# Patient Record
Sex: Male | Born: 1945 | Race: White | Hispanic: No | Marital: Married | Smoking: Former smoker
Health system: Southern US, Community
[De-identification: ages and names within clinical notes are randomized; demographics above are authoritative.]

## PROBLEM LIST (undated history)

## (undated) DIAGNOSIS — N183 Chronic kidney disease, stage 3 (moderate): Secondary | ICD-10-CM

## (undated) DIAGNOSIS — I2510 Atherosclerotic heart disease of native coronary artery without angina pectoris: Secondary | ICD-10-CM

## (undated) DIAGNOSIS — E039 Hypothyroidism, unspecified: Secondary | ICD-10-CM

## (undated) DIAGNOSIS — N368 Other specified disorders of urethra: Secondary | ICD-10-CM

## (undated) DIAGNOSIS — I10 Essential (primary) hypertension: Secondary | ICD-10-CM

## (undated) DIAGNOSIS — I119 Hypertensive heart disease without heart failure: Secondary | ICD-10-CM

## (undated) DIAGNOSIS — C61 Malignant neoplasm of prostate: Secondary | ICD-10-CM

## (undated) DIAGNOSIS — E785 Hyperlipidemia, unspecified: Secondary | ICD-10-CM

## (undated) DIAGNOSIS — K219 Gastro-esophageal reflux disease without esophagitis: Secondary | ICD-10-CM

## (undated) DIAGNOSIS — I480 Paroxysmal atrial fibrillation: Secondary | ICD-10-CM

## (undated) DIAGNOSIS — H532 Diplopia: Secondary | ICD-10-CM

## (undated) DIAGNOSIS — Z978 Presence of other specified devices: Secondary | ICD-10-CM

## (undated) DIAGNOSIS — R7301 Impaired fasting glucose: Secondary | ICD-10-CM

## (undated) DIAGNOSIS — G7000 Myasthenia gravis without (acute) exacerbation: Secondary | ICD-10-CM

## (undated) DIAGNOSIS — R339 Retention of urine, unspecified: Secondary | ICD-10-CM

## (undated) DIAGNOSIS — D649 Anemia, unspecified: Secondary | ICD-10-CM

## (undated) HISTORY — DX: Paroxysmal atrial fibrillation: I48.0

## (undated) HISTORY — PX: OTHER SURGICAL HISTORY: SHX169

## (undated) HISTORY — DX: Retention of urine, unspecified: R33.9

## (undated) HISTORY — DX: Chronic kidney disease, stage 3 (moderate): N18.3

## (undated) HISTORY — DX: Hypothyroidism, unspecified: E03.9

## (undated) HISTORY — PX: COLONOSCOPY: SHX174

## (undated) HISTORY — DX: Diplopia: H53.2

## (undated) HISTORY — DX: Hyperlipidemia, unspecified: E78.5

## (undated) HISTORY — DX: Impaired fasting glucose: R73.01

## (undated) HISTORY — DX: Atherosclerotic heart disease of native coronary artery without angina pectoris: I25.10

## (undated) HISTORY — DX: Presence of other specified devices: Z97.8

## (undated) HISTORY — DX: Myasthenia gravis without (acute) exacerbation: G70.00

## (undated) HISTORY — DX: Hypertensive heart disease without heart failure: I11.9

## (undated) HISTORY — DX: Gastro-esophageal reflux disease without esophagitis: K21.9

## (undated) HISTORY — DX: Essential (primary) hypertension: I10

## (undated) HISTORY — DX: Other specified disorders of urethra: N36.8

## (undated) HISTORY — DX: Malignant neoplasm of prostate: C61

## (undated) HISTORY — PX: TONSILLECTOMY: SUR1361

## (undated) HISTORY — DX: Anemia, unspecified: D64.9

---

## 2003-03-04 ENCOUNTER — Encounter (INDEPENDENT_AMBULATORY_CARE_PROVIDER_SITE_OTHER): Payer: Self-pay | Admitting: Cardiology

## 2003-03-04 ENCOUNTER — Ambulatory Visit (HOSPITAL_COMMUNITY): Admission: RE | Admit: 2003-03-04 | Discharge: 2003-03-04 | Payer: Self-pay | Admitting: Cardiology

## 2007-02-01 DIAGNOSIS — C61 Malignant neoplasm of prostate: Secondary | ICD-10-CM

## 2007-02-01 HISTORY — PX: PROSTATECTOMY: SHX69

## 2007-02-01 HISTORY — DX: Malignant neoplasm of prostate: C61

## 2007-03-08 ENCOUNTER — Ambulatory Visit (HOSPITAL_COMMUNITY): Admission: RE | Admit: 2007-03-08 | Discharge: 2007-03-08 | Payer: Self-pay | Admitting: Urology

## 2007-03-28 ENCOUNTER — Ambulatory Visit: Admission: RE | Admit: 2007-03-28 | Discharge: 2007-04-17 | Payer: Self-pay | Admitting: Radiation Oncology

## 2007-06-06 ENCOUNTER — Encounter (INDEPENDENT_AMBULATORY_CARE_PROVIDER_SITE_OTHER): Payer: Self-pay | Admitting: Urology

## 2007-06-06 ENCOUNTER — Inpatient Hospital Stay (HOSPITAL_COMMUNITY): Admission: RE | Admit: 2007-06-06 | Discharge: 2007-06-07 | Payer: Self-pay | Admitting: Urology

## 2008-02-01 HISTORY — PX: OTHER SURGICAL HISTORY: SHX169

## 2008-04-02 ENCOUNTER — Observation Stay (HOSPITAL_COMMUNITY): Admission: EM | Admit: 2008-04-02 | Discharge: 2008-04-03 | Payer: Self-pay | Admitting: Emergency Medicine

## 2008-04-02 ENCOUNTER — Ambulatory Visit: Admission: RE | Admit: 2008-04-02 | Discharge: 2008-05-15 | Payer: Self-pay | Admitting: Radiation Oncology

## 2008-07-23 ENCOUNTER — Ambulatory Visit: Admission: RE | Admit: 2008-07-23 | Discharge: 2008-08-07 | Payer: Self-pay | Admitting: Radiation Oncology

## 2008-08-06 ENCOUNTER — Encounter (HOSPITAL_COMMUNITY): Admission: RE | Admit: 2008-08-06 | Discharge: 2008-10-30 | Payer: Self-pay | Admitting: Urology

## 2008-10-08 ENCOUNTER — Ambulatory Visit: Admission: RE | Admit: 2008-10-08 | Discharge: 2009-01-06 | Payer: Self-pay | Admitting: Radiation Oncology

## 2010-05-13 LAB — CARDIAC PANEL(CRET KIN+CKTOT+MB+TROPI)
Relative Index: INVALID (ref 0.0–2.5)
Relative Index: INVALID (ref 0.0–2.5)
Troponin I: 0.01 ng/mL (ref 0.00–0.06)

## 2010-05-13 LAB — DIFFERENTIAL
Eosinophils Absolute: 0.1 10*3/uL (ref 0.0–0.7)
Lymphocytes Relative: 20 % (ref 12–46)
Lymphs Abs: 1.2 10*3/uL (ref 0.7–4.0)
Neutro Abs: 4.4 10*3/uL (ref 1.7–7.7)
Neutrophils Relative %: 71 % (ref 43–77)

## 2010-05-13 LAB — PROTIME-INR
INR: 2.3 — ABNORMAL HIGH (ref 0.00–1.49)
INR: 2.5 — ABNORMAL HIGH (ref 0.00–1.49)
Prothrombin Time: 27.1 seconds — ABNORMAL HIGH (ref 11.6–15.2)
Prothrombin Time: 28.8 seconds — ABNORMAL HIGH (ref 11.6–15.2)

## 2010-05-13 LAB — COMPREHENSIVE METABOLIC PANEL
BUN: 15 mg/dL (ref 6–23)
CO2: 25 mEq/L (ref 19–32)
Calcium: 9.2 mg/dL (ref 8.4–10.5)
Creatinine, Ser: 1.34 mg/dL (ref 0.4–1.5)
GFR calc non Af Amer: 54 mL/min — ABNORMAL LOW (ref >60)
Glucose, Bld: 89 mg/dL (ref 70–99)

## 2010-05-13 LAB — CBC
MCHC: 35 g/dL (ref 30.0–36.0)
MCV: 93.7 fL (ref 78.0–100.0)
Platelets: 317 10*3/uL (ref 150–400)

## 2010-05-13 LAB — CK TOTAL AND CKMB (NOT AT ARMC)
Relative Index: INVALID (ref 0.0–2.5)
Total CK: 99 U/L (ref 7–232)

## 2010-05-13 LAB — TROPONIN I: Troponin I: 0.03 ng/mL (ref 0.00–0.06)

## 2010-06-15 NOTE — Op Note (Signed)
Eugene Castaneda, Eugene Castaneda             ACCOUNT NO.:  [id number]   MEDICAL RECORD NO.:  [id number]          PATIENT TYPE:  INP   LOCATION:  0005                         FACILITY:  [facility]   PHYSICIAN:  Ronald L. Earlene Plater, M.D.  DATE OF BIRTH:  01-30-1946   DATE OF PROCEDURE:  06/06/2007  DATE OF DISCHARGE:                               OPERATIVE REPORT   DIAGNOSIS:  Adenocarcinoma of the prostate.   OPERATIVE PROCEDURE:  Robotic assisted laparoscopic radical  prostatectomy with bilateral pelvic lymphadenectomy.   SURGEON:  Gaynelle Arabian, M.D.   ASSISTANT:  Heloise Purpura, MD   ANESTHESIA:  General endotracheal.   ESTIMATED BLOOD LOSS:  100 mL.   TUBES:  20-French coude Foley catheter and a large round Blake drain.   COMPLICATIONS:  None.   INDICATIONS FOR PROCEDURE:  Eugene Castaneda is a very nice 65 year old white  male who presented with an elevated PSA.  He subsequently underwent  ultrasound and biopsy of the prostate which revealed a Gleason score 6  which was 3+ 3 adenocarcinoma in 10% of biopsy specimen at the right  apex of prostate.  However it was 2 of 2 cores at 40% Gleason score 8  4+4 carcinoma of the left apex of the prostate.  No other cancer was  noted.  Metastatic workup was essentially negative.  He has been cleared  for surgery by both Dr. Abigail Miyamoto and by Dr. Viann Fish and after  understanding risks, benefits and alternatives he has elected to undergo  the above procedure.   PROCEDURE IN DETAIL:  The patient is placed in supine position.  After  proper general tracheal anesthesia, he was placed in exaggerated  lithotomy position, prepped and draped with Betadine in sterile fashion.  A 24 French 30 mL balloon Foley was inserted in the bladder and the  bladder was drained.  Periumbilical incision was made vertically, and  the anterior and posterior rectus sheaths and peritoneum were taken  down.  A 12-mm port was placed under direct vision and the abdomen was  insufflated with carbon dioxide.  A stitch was placed at the skin level  with Vicryl to prevent leakage of gas.  Under direct vision, right and  left robotic arms were placed in appropriate position as was the fourth  arm.  A 5 mm and 12 mm working ports were placed in the right side of  the abdomen in the usual position.  The dissection was begun with the  hot shears.  The median and medial umbilical ligaments were taken down.  The space of Retzius was entered and dissected clear.  The endopelvic  fascia was incised bilaterally.  The lateral portion of the prostate was  taken down.  The puboprostatic ligaments were incised and the  superficial dorsal vein was cauterized with ERBE bipolar and incised.  The deep dorsal vein complex was then clamped with an endovascular  stapler and clamped and cut.  The bladder neck was then approached after  an amp of indigo carmine had been given IV.  It was sharply taken down  to the Foley which was then used as traction  device and the posterior  bladder neck was taken from the prostate.  The seminal vesicles and  ampulla of the vas deferens were dissected free.  The ampulla of the vas  deferens were incised and seminal vesicles were dissected to the tips.  The right tip was clipped, the left tip was taken down both sharply and  bluntly and vessels were cauterized with Bovie coagulation cautery,  avoiding the neurovascular bundle.  Denonvilliers fascia was then  approached posteriorly and taken down to the apex of the prostate and  laterally exposing the pedicles of the prostate.  The right and left  lymphadenectomy were then performed under direct vision.  The obturator  lymph nodes in external iliac lymph nodes were taken down. All  lymphatics apically and proximally were clipped with Hem-o-lok clips.  Specimens were removed and submitted as right and left pelvic lymph  nodes.  The dissection was carried down to the circumflex iliac veins  and  obturator vessels and nerves were visualized and protected.  The  nerve spare was then performed bilaterally.  Although there was more  tumor and higher grade tumor the left apex, it appeared to be free, so  this was taken down.  A veil type procedure was performed and the  posterior lateral tissue was maintained in the proper plane.  The planes  were joined and Denonvilliers fascia that had been created previously  and the pedicle was taken in packets on both right and left side,  clipped with Hem-o-lok clips and taken down to the apex of the prostate.  The apex of the prostate was then sharply dissected to the Foley and the  posterior urethra was incised.  The specimen was placed in the right  lower quadrant.  Good hemostasis noted to be present.  The pelvis was  irrigated with fluid.  The rectum was then insufflated with a previously  placed red rubber catheter and there was no leakage noted.  Good  hemostasis was noted be present.  Then another amp of indigo carmine was  given IV and the bladder neck appeared to be appropriate size.  The  bladder neck was then approximated to the urethra in the 6 o'clock  position with a 2-0 Vicryl suture and the anastomosis was completed with  a 3-0 Monocryl suture with the dyed and undyed tied together in a knot  posteriorly and a running fashion and tied anteriorly.  A 20-French  coude catheter was passed in the bladder.  The bladder was noted to  irrigate well with blue dye and no leakage was noted.  The Foley was  left in place.  A large Blake drain was placed through the fourth arm  port and the cannula was removed and it was sutured in place with nylon  suture.  The 12 mm working portal on the right side was removed and  closed under direct vision with suture passers with a 2-0 Vicryl suture.  The ports were visually removed and there was no bleeding noted.  The  drain was in place and the specimen was grasped with an EndoCatch bag  and placed  in the position actually before the 12 mm port was removed.  All ports were removed and under direct vision.  The specimen was then  removed through the periumbilical incision.  The periumbilical incision  was irrigated.  The fascia was closed with a running 2-0 Vicryl suture.  All wounds were injected with 0.25% Marcaine solution and closed with  skin  staples and dressed sterilely.  Specimens were all submitted to  pathology.      Ronald L. Earlene Plater, M.D.  Electronically Signed     RLD/MEDQ  D:  06/06/2007  T:  06/06/2007  Job:  [id number]

## 2010-06-15 NOTE — Discharge Summary (Signed)
[name], Eugene Castaneda             ACCOUNT NO.:  [id number]   MEDICAL RECORD NO.:  [id number]          PATIENT TYPE:  INP   LOCATION:  3734                         FACILITY:  [facility]   PHYSICIAN:  Georga Hacking, M.D.DATE OF BIRTH:  [birth year]   DATE OF ADMISSION:  04/02/2008  DATE OF DISCHARGE:  04/03/2008                               DISCHARGE SUMMARY   FINAL DIAGNOSES:  1. Substernal chest pressure and tightness, resolved.      a.     Myocardial function ruled out.  2. Atrial fibrillation and atrial flutter with recurrence.      a.     Recent ablation for atrial fibrillation and flutter at [facility] on February 18.  3. Hypertension.  4. Prostate cancer.  5. Hypothyroidism.   HISTORY:  A 65 year old male who had a recent atrial fibrillation  ablation for recurrent paroxysmal atrial fibrillation.  He has been  doing well and had just been seen in the office last week.  This morning  he developed substernal pressure and tightness and went to see the nurse  at work and then came to the office.  An EKG was unremarkable but he was  in atrial flutter at the time.  The chest pressure was something that  was new for him and he was admitted to rule out a myocardial infarction.  Please see the previously dictated history and physical for the  remainder of the details.   HOSPITAL COURSE:  INR was 2.3, CBC was normal, chemistry panel was  normal.  Serial cardiac enzymes and troponin were all normal.  An  echocardiogram done in the office showed no evidence of pericardial  effusion, he had some evidence of left ventricular hypertrophy.  He was  given some Tylenol as pain went away, subsequently he had his flecainide  dose increased and his heart went back into rhythm.  His symptoms  resolved when he went back into sinus rhythm.  He was discharged home  the next day in improved condition.   DISCHARGE MEDICATIONS:  1. Flecainide 50 mg b.i.d., he may take an extra  flecainide 150 mg if      he has recurrent atrial fibrillation or flutter.  2. Carafate 1 gram four times a day.  3. Multivitamins daily.  4. Protonix 40 mg daily.  5. Lisinopril 30 mg daily.  6. Trilipix 135 mg daily.  7. Aspirin 81 mg daily.  8. Levothyroxine 0.15 mg daily.  9. Fish oil 1200 mg twice daily.  10.Coumadin 1 mg daily except 2 mg on Sunday.   He is to follow up with me in 1 week and have a Cardiolite stress test.  He is not to exercise until he has this done.  He is to use extra  flecainide if has recurrent atrial fibrillation.  Is to call if there  are problems.      Georga Hacking, M.D.  Electronically Signed     WST/MEDQ  D:  04/03/2008  T:  04/03/2008  Job:  [id number]   cc:   Chales Salmon. Abigail Miyamoto, M.D.  Mick Sell, MD  Lucrezia Starch. Earlene Plater, M.D.

## 2010-10-26 LAB — BASIC METABOLIC PANEL
BUN: 16
Creatinine, Ser: 1.41
GFR calc non Af Amer: 51 — ABNORMAL LOW
Potassium: 4.1

## 2010-10-26 LAB — CBC
HCT: 38 — ABNORMAL LOW
Platelets: 232
WBC: 4.8

## 2011-02-10 ENCOUNTER — Other Ambulatory Visit: Payer: Self-pay | Admitting: Cardiology

## 2011-02-14 DIAGNOSIS — N476 Balanoposthitis: Secondary | ICD-10-CM | POA: Diagnosis not present

## 2011-02-14 DIAGNOSIS — N393 Stress incontinence (female) (male): Secondary | ICD-10-CM | POA: Diagnosis not present

## 2011-02-14 DIAGNOSIS — C61 Malignant neoplasm of prostate: Secondary | ICD-10-CM | POA: Diagnosis not present

## 2011-02-14 DIAGNOSIS — N529 Male erectile dysfunction, unspecified: Secondary | ICD-10-CM | POA: Diagnosis not present

## 2011-03-11 DIAGNOSIS — I10 Essential (primary) hypertension: Secondary | ICD-10-CM | POA: Diagnosis not present

## 2011-03-11 DIAGNOSIS — E039 Hypothyroidism, unspecified: Secondary | ICD-10-CM | POA: Diagnosis not present

## 2011-03-15 DIAGNOSIS — R7309 Other abnormal glucose: Secondary | ICD-10-CM | POA: Diagnosis not present

## 2011-03-15 DIAGNOSIS — I4891 Unspecified atrial fibrillation: Secondary | ICD-10-CM | POA: Diagnosis not present

## 2011-03-15 DIAGNOSIS — I10 Essential (primary) hypertension: Secondary | ICD-10-CM | POA: Diagnosis not present

## 2011-03-15 DIAGNOSIS — E039 Hypothyroidism, unspecified: Secondary | ICD-10-CM | POA: Diagnosis not present

## 2011-03-25 DIAGNOSIS — I10 Essential (primary) hypertension: Secondary | ICD-10-CM | POA: Diagnosis not present

## 2011-03-25 DIAGNOSIS — I4891 Unspecified atrial fibrillation: Secondary | ICD-10-CM | POA: Diagnosis not present

## 2011-03-25 DIAGNOSIS — I495 Sick sinus syndrome: Secondary | ICD-10-CM | POA: Diagnosis not present

## 2011-03-25 DIAGNOSIS — Z79899 Other long term (current) drug therapy: Secondary | ICD-10-CM | POA: Diagnosis not present

## 2011-03-25 DIAGNOSIS — E785 Hyperlipidemia, unspecified: Secondary | ICD-10-CM | POA: Diagnosis not present

## 2011-04-08 DIAGNOSIS — R7989 Other specified abnormal findings of blood chemistry: Secondary | ICD-10-CM | POA: Diagnosis not present

## 2011-04-11 DIAGNOSIS — E039 Hypothyroidism, unspecified: Secondary | ICD-10-CM | POA: Diagnosis not present

## 2011-05-16 DIAGNOSIS — C61 Malignant neoplasm of prostate: Secondary | ICD-10-CM | POA: Diagnosis not present

## 2011-07-26 DIAGNOSIS — R7301 Impaired fasting glucose: Secondary | ICD-10-CM | POA: Diagnosis not present

## 2011-07-26 DIAGNOSIS — I10 Essential (primary) hypertension: Secondary | ICD-10-CM | POA: Diagnosis not present

## 2011-07-26 DIAGNOSIS — E785 Hyperlipidemia, unspecified: Secondary | ICD-10-CM | POA: Diagnosis not present

## 2011-07-26 DIAGNOSIS — E039 Hypothyroidism, unspecified: Secondary | ICD-10-CM | POA: Diagnosis not present

## 2011-08-15 DIAGNOSIS — C61 Malignant neoplasm of prostate: Secondary | ICD-10-CM | POA: Diagnosis not present

## 2011-08-15 DIAGNOSIS — N393 Stress incontinence (female) (male): Secondary | ICD-10-CM | POA: Diagnosis not present

## 2011-09-06 DIAGNOSIS — I10 Essential (primary) hypertension: Secondary | ICD-10-CM | POA: Diagnosis not present

## 2011-09-30 DIAGNOSIS — I495 Sick sinus syndrome: Secondary | ICD-10-CM | POA: Diagnosis not present

## 2011-09-30 DIAGNOSIS — I10 Essential (primary) hypertension: Secondary | ICD-10-CM | POA: Diagnosis not present

## 2011-09-30 DIAGNOSIS — E785 Hyperlipidemia, unspecified: Secondary | ICD-10-CM | POA: Diagnosis not present

## 2011-09-30 DIAGNOSIS — Z79899 Other long term (current) drug therapy: Secondary | ICD-10-CM | POA: Diagnosis not present

## 2011-09-30 DIAGNOSIS — I4891 Unspecified atrial fibrillation: Secondary | ICD-10-CM | POA: Diagnosis not present

## 2011-10-11 ENCOUNTER — Other Ambulatory Visit: Payer: Self-pay | Admitting: Cardiology

## 2011-10-25 DIAGNOSIS — H2510 Age-related nuclear cataract, unspecified eye: Secondary | ICD-10-CM | POA: Diagnosis not present

## 2011-11-01 DIAGNOSIS — Z23 Encounter for immunization: Secondary | ICD-10-CM | POA: Diagnosis not present

## 2012-02-08 DIAGNOSIS — E039 Hypothyroidism, unspecified: Secondary | ICD-10-CM | POA: Diagnosis not present

## 2012-02-08 DIAGNOSIS — E785 Hyperlipidemia, unspecified: Secondary | ICD-10-CM | POA: Diagnosis not present

## 2012-02-08 DIAGNOSIS — Z125 Encounter for screening for malignant neoplasm of prostate: Secondary | ICD-10-CM | POA: Diagnosis not present

## 2012-02-08 DIAGNOSIS — R7301 Impaired fasting glucose: Secondary | ICD-10-CM | POA: Diagnosis not present

## 2012-02-08 DIAGNOSIS — I10 Essential (primary) hypertension: Secondary | ICD-10-CM | POA: Diagnosis not present

## 2012-02-13 DIAGNOSIS — N529 Male erectile dysfunction, unspecified: Secondary | ICD-10-CM | POA: Diagnosis not present

## 2012-02-13 DIAGNOSIS — N35919 Unspecified urethral stricture, male, unspecified site: Secondary | ICD-10-CM | POA: Diagnosis not present

## 2012-02-13 DIAGNOSIS — C61 Malignant neoplasm of prostate: Secondary | ICD-10-CM | POA: Diagnosis not present

## 2012-02-15 DIAGNOSIS — Z1212 Encounter for screening for malignant neoplasm of rectum: Secondary | ICD-10-CM | POA: Diagnosis not present

## 2012-02-15 DIAGNOSIS — I4891 Unspecified atrial fibrillation: Secondary | ICD-10-CM | POA: Diagnosis not present

## 2012-02-15 DIAGNOSIS — K219 Gastro-esophageal reflux disease without esophagitis: Secondary | ICD-10-CM | POA: Diagnosis not present

## 2012-02-15 DIAGNOSIS — R7301 Impaired fasting glucose: Secondary | ICD-10-CM | POA: Diagnosis not present

## 2012-02-15 DIAGNOSIS — Z0000 Encounter for general adult medical examination without abnormal findings: Secondary | ICD-10-CM | POA: Diagnosis not present

## 2012-03-30 DIAGNOSIS — E785 Hyperlipidemia, unspecified: Secondary | ICD-10-CM | POA: Diagnosis not present

## 2012-03-30 DIAGNOSIS — Z79899 Other long term (current) drug therapy: Secondary | ICD-10-CM | POA: Diagnosis not present

## 2012-03-30 DIAGNOSIS — I10 Essential (primary) hypertension: Secondary | ICD-10-CM | POA: Diagnosis not present

## 2012-03-30 DIAGNOSIS — I495 Sick sinus syndrome: Secondary | ICD-10-CM | POA: Diagnosis not present

## 2012-03-30 DIAGNOSIS — I4891 Unspecified atrial fibrillation: Secondary | ICD-10-CM | POA: Diagnosis not present

## 2012-05-14 DIAGNOSIS — C61 Malignant neoplasm of prostate: Secondary | ICD-10-CM | POA: Diagnosis not present

## 2012-05-14 DIAGNOSIS — R972 Elevated prostate specific antigen [PSA]: Secondary | ICD-10-CM | POA: Diagnosis not present

## 2012-05-17 DIAGNOSIS — N529 Male erectile dysfunction, unspecified: Secondary | ICD-10-CM | POA: Diagnosis not present

## 2012-05-17 DIAGNOSIS — Z8546 Personal history of malignant neoplasm of prostate: Secondary | ICD-10-CM | POA: Diagnosis not present

## 2012-05-17 DIAGNOSIS — N320 Bladder-neck obstruction: Secondary | ICD-10-CM | POA: Diagnosis not present

## 2012-05-17 DIAGNOSIS — N393 Stress incontinence (female) (male): Secondary | ICD-10-CM | POA: Diagnosis not present

## 2012-06-29 DIAGNOSIS — N35919 Unspecified urethral stricture, male, unspecified site: Secondary | ICD-10-CM | POA: Diagnosis not present

## 2012-06-29 DIAGNOSIS — Z8546 Personal history of malignant neoplasm of prostate: Secondary | ICD-10-CM | POA: Diagnosis not present

## 2012-06-29 DIAGNOSIS — N393 Stress incontinence (female) (male): Secondary | ICD-10-CM | POA: Diagnosis not present

## 2012-06-29 DIAGNOSIS — N529 Male erectile dysfunction, unspecified: Secondary | ICD-10-CM | POA: Diagnosis not present

## 2012-08-13 DIAGNOSIS — N35919 Unspecified urethral stricture, male, unspecified site: Secondary | ICD-10-CM | POA: Diagnosis not present

## 2012-08-13 DIAGNOSIS — C61 Malignant neoplasm of prostate: Secondary | ICD-10-CM | POA: Diagnosis not present

## 2012-08-13 DIAGNOSIS — N529 Male erectile dysfunction, unspecified: Secondary | ICD-10-CM | POA: Diagnosis not present

## 2012-08-20 DIAGNOSIS — I10 Essential (primary) hypertension: Secondary | ICD-10-CM | POA: Diagnosis not present

## 2012-08-20 DIAGNOSIS — E785 Hyperlipidemia, unspecified: Secondary | ICD-10-CM | POA: Diagnosis not present

## 2012-08-22 DIAGNOSIS — I10 Essential (primary) hypertension: Secondary | ICD-10-CM | POA: Diagnosis not present

## 2012-08-23 DIAGNOSIS — C61 Malignant neoplasm of prostate: Secondary | ICD-10-CM | POA: Diagnosis not present

## 2012-08-23 DIAGNOSIS — I10 Essential (primary) hypertension: Secondary | ICD-10-CM | POA: Diagnosis not present

## 2012-08-23 DIAGNOSIS — E785 Hyperlipidemia, unspecified: Secondary | ICD-10-CM | POA: Diagnosis not present

## 2012-08-23 DIAGNOSIS — N183 Chronic kidney disease, stage 3 unspecified: Secondary | ICD-10-CM | POA: Diagnosis not present

## 2012-08-23 DIAGNOSIS — I4891 Unspecified atrial fibrillation: Secondary | ICD-10-CM | POA: Diagnosis not present

## 2012-08-23 DIAGNOSIS — R7301 Impaired fasting glucose: Secondary | ICD-10-CM | POA: Diagnosis not present

## 2012-08-23 DIAGNOSIS — Z6827 Body mass index (BMI) 27.0-27.9, adult: Secondary | ICD-10-CM | POA: Diagnosis not present

## 2012-09-28 DIAGNOSIS — I10 Essential (primary) hypertension: Secondary | ICD-10-CM | POA: Diagnosis not present

## 2012-09-28 DIAGNOSIS — I495 Sick sinus syndrome: Secondary | ICD-10-CM | POA: Diagnosis not present

## 2012-09-28 DIAGNOSIS — Z79899 Other long term (current) drug therapy: Secondary | ICD-10-CM | POA: Diagnosis not present

## 2012-09-28 DIAGNOSIS — E785 Hyperlipidemia, unspecified: Secondary | ICD-10-CM | POA: Diagnosis not present

## 2012-09-28 DIAGNOSIS — I4891 Unspecified atrial fibrillation: Secondary | ICD-10-CM | POA: Diagnosis not present

## 2012-09-28 DIAGNOSIS — N183 Chronic kidney disease, stage 3 unspecified: Secondary | ICD-10-CM | POA: Diagnosis not present

## 2012-10-02 DIAGNOSIS — N189 Chronic kidney disease, unspecified: Secondary | ICD-10-CM | POA: Diagnosis not present

## 2012-10-02 DIAGNOSIS — N529 Male erectile dysfunction, unspecified: Secondary | ICD-10-CM | POA: Diagnosis not present

## 2012-10-02 DIAGNOSIS — C61 Malignant neoplasm of prostate: Secondary | ICD-10-CM | POA: Diagnosis not present

## 2012-10-02 DIAGNOSIS — N35919 Unspecified urethral stricture, male, unspecified site: Secondary | ICD-10-CM | POA: Diagnosis not present

## 2012-10-02 DIAGNOSIS — N393 Stress incontinence (female) (male): Secondary | ICD-10-CM | POA: Diagnosis not present

## 2012-10-24 DIAGNOSIS — H2510 Age-related nuclear cataract, unspecified eye: Secondary | ICD-10-CM | POA: Diagnosis not present

## 2012-11-02 DIAGNOSIS — Z23 Encounter for immunization: Secondary | ICD-10-CM | POA: Diagnosis not present

## 2012-11-09 DIAGNOSIS — I129 Hypertensive chronic kidney disease with stage 1 through stage 4 chronic kidney disease, or unspecified chronic kidney disease: Secondary | ICD-10-CM | POA: Diagnosis not present

## 2012-11-09 DIAGNOSIS — N183 Chronic kidney disease, stage 3 unspecified: Secondary | ICD-10-CM | POA: Diagnosis not present

## 2012-11-09 DIAGNOSIS — E213 Hyperparathyroidism, unspecified: Secondary | ICD-10-CM | POA: Diagnosis not present

## 2012-11-12 DIAGNOSIS — Z859 Personal history of malignant neoplasm, unspecified: Secondary | ICD-10-CM | POA: Diagnosis not present

## 2012-11-12 DIAGNOSIS — R339 Retention of urine, unspecified: Secondary | ICD-10-CM | POA: Diagnosis not present

## 2013-01-17 DIAGNOSIS — N35919 Unspecified urethral stricture, male, unspecified site: Secondary | ICD-10-CM | POA: Diagnosis not present

## 2013-01-17 DIAGNOSIS — N529 Male erectile dysfunction, unspecified: Secondary | ICD-10-CM | POA: Diagnosis not present

## 2013-01-17 DIAGNOSIS — C61 Malignant neoplasm of prostate: Secondary | ICD-10-CM | POA: Diagnosis not present

## 2013-01-17 DIAGNOSIS — N393 Stress incontinence (female) (male): Secondary | ICD-10-CM | POA: Diagnosis not present

## 2013-01-17 DIAGNOSIS — N320 Bladder-neck obstruction: Secondary | ICD-10-CM | POA: Diagnosis not present

## 2013-01-17 DIAGNOSIS — R339 Retention of urine, unspecified: Secondary | ICD-10-CM | POA: Diagnosis not present

## 2013-01-17 DIAGNOSIS — N189 Chronic kidney disease, unspecified: Secondary | ICD-10-CM | POA: Diagnosis not present

## 2013-02-01 DIAGNOSIS — N135 Crossing vessel and stricture of ureter without hydronephrosis: Secondary | ICD-10-CM | POA: Diagnosis not present

## 2013-02-04 DIAGNOSIS — N529 Male erectile dysfunction, unspecified: Secondary | ICD-10-CM | POA: Diagnosis not present

## 2013-02-04 DIAGNOSIS — N35919 Unspecified urethral stricture, male, unspecified site: Secondary | ICD-10-CM | POA: Diagnosis not present

## 2013-02-04 DIAGNOSIS — C61 Malignant neoplasm of prostate: Secondary | ICD-10-CM | POA: Diagnosis not present

## 2013-02-04 DIAGNOSIS — N393 Stress incontinence (female) (male): Secondary | ICD-10-CM | POA: Diagnosis not present

## 2013-02-04 DIAGNOSIS — N390 Urinary tract infection, site not specified: Secondary | ICD-10-CM | POA: Diagnosis not present

## 2013-03-07 DIAGNOSIS — N393 Stress incontinence (female) (male): Secondary | ICD-10-CM | POA: Diagnosis not present

## 2013-03-07 DIAGNOSIS — N35919 Unspecified urethral stricture, male, unspecified site: Secondary | ICD-10-CM | POA: Diagnosis not present

## 2013-03-07 DIAGNOSIS — C61 Malignant neoplasm of prostate: Secondary | ICD-10-CM | POA: Diagnosis not present

## 2013-03-18 DIAGNOSIS — R7309 Other abnormal glucose: Secondary | ICD-10-CM | POA: Diagnosis not present

## 2013-03-18 DIAGNOSIS — Z125 Encounter for screening for malignant neoplasm of prostate: Secondary | ICD-10-CM | POA: Diagnosis not present

## 2013-03-18 DIAGNOSIS — E785 Hyperlipidemia, unspecified: Secondary | ICD-10-CM | POA: Diagnosis not present

## 2013-03-18 DIAGNOSIS — I10 Essential (primary) hypertension: Secondary | ICD-10-CM | POA: Diagnosis not present

## 2013-03-18 DIAGNOSIS — E039 Hypothyroidism, unspecified: Secondary | ICD-10-CM | POA: Diagnosis not present

## 2013-03-26 DIAGNOSIS — E785 Hyperlipidemia, unspecified: Secondary | ICD-10-CM | POA: Diagnosis not present

## 2013-03-26 DIAGNOSIS — E039 Hypothyroidism, unspecified: Secondary | ICD-10-CM | POA: Diagnosis not present

## 2013-03-26 DIAGNOSIS — M1990 Unspecified osteoarthritis, unspecified site: Secondary | ICD-10-CM | POA: Diagnosis not present

## 2013-03-26 DIAGNOSIS — R7301 Impaired fasting glucose: Secondary | ICD-10-CM | POA: Diagnosis not present

## 2013-03-26 DIAGNOSIS — N183 Chronic kidney disease, stage 3 unspecified: Secondary | ICD-10-CM | POA: Diagnosis not present

## 2013-03-26 DIAGNOSIS — Z1331 Encounter for screening for depression: Secondary | ICD-10-CM | POA: Diagnosis not present

## 2013-03-26 DIAGNOSIS — C61 Malignant neoplasm of prostate: Secondary | ICD-10-CM | POA: Diagnosis not present

## 2013-03-26 DIAGNOSIS — K219 Gastro-esophageal reflux disease without esophagitis: Secondary | ICD-10-CM | POA: Diagnosis not present

## 2013-03-26 DIAGNOSIS — Z0000 Encounter for general adult medical examination without abnormal findings: Secondary | ICD-10-CM | POA: Diagnosis not present

## 2013-03-27 DIAGNOSIS — Z1212 Encounter for screening for malignant neoplasm of rectum: Secondary | ICD-10-CM | POA: Diagnosis not present

## 2013-03-29 DIAGNOSIS — N183 Chronic kidney disease, stage 3 unspecified: Secondary | ICD-10-CM | POA: Diagnosis not present

## 2013-03-29 DIAGNOSIS — I495 Sick sinus syndrome: Secondary | ICD-10-CM | POA: Diagnosis not present

## 2013-03-29 DIAGNOSIS — I10 Essential (primary) hypertension: Secondary | ICD-10-CM | POA: Diagnosis not present

## 2013-03-29 DIAGNOSIS — I4891 Unspecified atrial fibrillation: Secondary | ICD-10-CM | POA: Diagnosis not present

## 2013-03-29 DIAGNOSIS — E785 Hyperlipidemia, unspecified: Secondary | ICD-10-CM | POA: Diagnosis not present

## 2013-03-29 DIAGNOSIS — Z79899 Other long term (current) drug therapy: Secondary | ICD-10-CM | POA: Diagnosis not present

## 2013-04-10 DIAGNOSIS — K921 Melena: Secondary | ICD-10-CM | POA: Diagnosis not present

## 2013-04-11 ENCOUNTER — Encounter: Payer: Self-pay | Admitting: Internal Medicine

## 2013-04-18 DIAGNOSIS — I10 Essential (primary) hypertension: Secondary | ICD-10-CM | POA: Diagnosis not present

## 2013-04-18 DIAGNOSIS — Z79899 Other long term (current) drug therapy: Secondary | ICD-10-CM | POA: Diagnosis not present

## 2013-04-18 DIAGNOSIS — N183 Chronic kidney disease, stage 3 unspecified: Secondary | ICD-10-CM | POA: Diagnosis not present

## 2013-04-18 DIAGNOSIS — I4891 Unspecified atrial fibrillation: Secondary | ICD-10-CM | POA: Diagnosis not present

## 2013-04-18 DIAGNOSIS — E785 Hyperlipidemia, unspecified: Secondary | ICD-10-CM | POA: Diagnosis not present

## 2013-04-18 DIAGNOSIS — R0789 Other chest pain: Secondary | ICD-10-CM | POA: Diagnosis not present

## 2013-04-18 DIAGNOSIS — I495 Sick sinus syndrome: Secondary | ICD-10-CM | POA: Diagnosis not present

## 2013-04-23 DIAGNOSIS — R339 Retention of urine, unspecified: Secondary | ICD-10-CM | POA: Diagnosis not present

## 2013-04-23 DIAGNOSIS — N189 Chronic kidney disease, unspecified: Secondary | ICD-10-CM | POA: Diagnosis not present

## 2013-04-23 DIAGNOSIS — N393 Stress incontinence (female) (male): Secondary | ICD-10-CM | POA: Diagnosis not present

## 2013-04-23 DIAGNOSIS — N529 Male erectile dysfunction, unspecified: Secondary | ICD-10-CM | POA: Diagnosis not present

## 2013-04-23 DIAGNOSIS — N35919 Unspecified urethral stricture, male, unspecified site: Secondary | ICD-10-CM | POA: Diagnosis not present

## 2013-04-23 DIAGNOSIS — C61 Malignant neoplasm of prostate: Secondary | ICD-10-CM | POA: Diagnosis not present

## 2013-05-06 DIAGNOSIS — C61 Malignant neoplasm of prostate: Secondary | ICD-10-CM | POA: Diagnosis not present

## 2013-06-05 ENCOUNTER — Encounter: Payer: Self-pay | Admitting: Internal Medicine

## 2013-06-05 ENCOUNTER — Ambulatory Visit (INDEPENDENT_AMBULATORY_CARE_PROVIDER_SITE_OTHER): Payer: Medicare Other | Admitting: Internal Medicine

## 2013-06-05 VITALS — BP 120/62 | HR 56 | Ht 71.5 in | Wt 204.2 lb

## 2013-06-05 DIAGNOSIS — R195 Other fecal abnormalities: Secondary | ICD-10-CM

## 2013-06-05 DIAGNOSIS — K219 Gastro-esophageal reflux disease without esophagitis: Secondary | ICD-10-CM

## 2013-06-05 DIAGNOSIS — Z86010 Personal history of colonic polyps: Secondary | ICD-10-CM

## 2013-06-05 DIAGNOSIS — R1310 Dysphagia, unspecified: Secondary | ICD-10-CM | POA: Diagnosis not present

## 2013-06-05 MED ORDER — MOVIPREP 100 G PO SOLR: 1.0000 | Freq: Once | ORAL | Status: DC

## 2013-06-05 NOTE — Patient Instructions (Signed)

## 2013-06-05 NOTE — Progress Notes (Signed)
HISTORY OF PRESENT ILLNESS:  Eugene Castaneda is a 68 y.o. male with multiple medical problems as listed below including hypertension, hypothyroidism, hyperlipidemia, prostate cancer status post prostatectomy followed by radiation therapy, and atrial fibrillation with prior ablation. He is sent today regarding Hemoccult-positive stool identified on recent annual physical exam. Outside records and laboratories reviewed. Patient's hemoglobin is normal. He apparently had a colonoscopy 6 years ago with Dr. Ferdinand Lango. No records, but the patient states he had "polyps". As best he can recall, followup in 10 years recommended. His GI review of systems is remarkable for chronic long-standing reflux symptoms. He takes antacids and over-the-counter acid suppressors. Does have mild intermittent dysphagia to solids. He denies melena or hematochezia. GI review of systems is otherwise negative.  REVIEW OF SYSTEMS:  All non-GI ROS negative except for arthritis  Past Medical History  Diagnosis Date  . Hypothyroidism   . Hypertension   . Hyperlipemia   . DDD (degenerative disc disease)   . Prostate cancer   . Urethral obstruction   . Atrial fibrillation   . GERD (gastroesophageal reflux disease)   . Impaired fasting glucose     Past Surgical History  Procedure Laterality Date  . Prostatectomy    . Catheter ablationfor afib    . Tonsillectomy      Social History Eugene Castaneda  reports that he quit smoking about 33 years ago. He does not have any smokeless tobacco history on file. He reports that he drinks alcohol. His drug history is not on file.  family history includes Breast cancer in his mother; Parkinson's disease in his father.  No Known Allergies     PHYSICAL EXAMINATION: Vital signs: BP 120/62  Pulse 56  Ht 5' 11.5" (1.816 m)  Wt 204 lb 3.2 oz (92.625 kg)  BMI 28.09 kg/m2  Constitutional: generally well-appearing, no acute distress Psychiatric: alert and oriented x3,  cooperative Eyes: extraocular movements intact, anicteric, conjunctiva pink Mouth: oral pharynx moist, no lesions Neck: supple no lymphadenopathy Cardiovascular: heart regular rate and rhythm, no murmur Lungs: clear to auscultation bilaterally Abdomen: soft, nontender, nondistended, no obvious ascites, no peritoneal signs, normal bowel sounds, no organomegaly Rectal: Deferred until colonoscopy Extremities: no lower extremity edema bilaterally Skin: no lesions on visible extremities Neuro: No focal deficits. No asterixis.    ASSESSMENT:  #1. Hemoccult-positive stool. Rule out GI mucosal lesion . Possibilities include reflux related esophagitis, ulcer, AVMs, neoplasia, or radiation proctitis #2. Chronic GERD. #3. Intermittent dysphagia. Rule out stricture #4. Prostate cancer status post prostatectomy followed by radiation therapy #5. Colonoscopy 2009 with "polyps".   PLAN:  #1. Reflux precautions #2. Daily PPI. Samples of Nexium 40 mg daily given. Thereafter, recommended to obtain Prilosec OTC 20 mg daily #3. Schedule colonoscopy and upper endoscopy to evaluate Hemoccult-positive stool and chronic reflux symptoms respectively.The nature of the procedure, as well as the risks, benefits, and alternatives were carefully and thoroughly reviewed with the patient. Ample time for discussion and questions allowed. The patient understood, was satisfied, and agreed to proceed. Movi prep prescribed. Patient instructed on its use. #4. Obtain outside colonoscopy report and pathology from 2009, if possible. Requested

## 2013-07-11 ENCOUNTER — Encounter: Payer: Self-pay | Admitting: Internal Medicine

## 2013-07-11 ENCOUNTER — Ambulatory Visit (AMBULATORY_SURGERY_CENTER): Payer: Medicare Other | Admitting: Internal Medicine

## 2013-07-11 VITALS — BP 109/66 | HR 55 | Temp 96.4°F | Resp 19 | Ht 71.5 in | Wt 204.0 lb

## 2013-07-11 DIAGNOSIS — R195 Other fecal abnormalities: Secondary | ICD-10-CM | POA: Diagnosis not present

## 2013-07-11 DIAGNOSIS — I4891 Unspecified atrial fibrillation: Secondary | ICD-10-CM | POA: Diagnosis not present

## 2013-07-11 DIAGNOSIS — D1330 Benign neoplasm of unspecified part of small intestine: Secondary | ICD-10-CM

## 2013-07-11 DIAGNOSIS — R1310 Dysphagia, unspecified: Secondary | ICD-10-CM

## 2013-07-11 DIAGNOSIS — E669 Obesity, unspecified: Secondary | ICD-10-CM | POA: Diagnosis not present

## 2013-07-11 DIAGNOSIS — D126 Benign neoplasm of colon, unspecified: Secondary | ICD-10-CM

## 2013-07-11 DIAGNOSIS — I10 Essential (primary) hypertension: Secondary | ICD-10-CM | POA: Diagnosis not present

## 2013-07-11 DIAGNOSIS — Z86010 Personal history of colonic polyps: Secondary | ICD-10-CM | POA: Diagnosis not present

## 2013-07-11 DIAGNOSIS — K219 Gastro-esophageal reflux disease without esophagitis: Secondary | ICD-10-CM | POA: Diagnosis not present

## 2013-07-11 MED ORDER — SODIUM CHLORIDE 0.9 % IV SOLN: 500.0000 mL | INTRAVENOUS | Status: DC

## 2013-07-11 NOTE — Op Note (Signed)
[place]  [address] [place], [id number]   ENDOSCOPY PROCEDURE REPORT  PATIENT: Eugene, Castaneda  MR#: [id number] BIRTHDATE: 25-Sep-1945 , 67  yrs. old GENDER: Male ENDOSCOPIST: Eustace Quail, MD REFERRED BY:  [name], M.D. PROCEDURE DATE:  07/11/2013 PROCEDURE:  EGD w/ biopsy ASA CLASS:     Class II INDICATIONS:  History of esophageal reflux.   Dysphagia (Resolved after initiating PPI).   Heme positive stool. MEDICATIONS: MAC sedation, administered by CRNA and propofol (Diprivan) 80mg  IV TOPICAL ANESTHETIC: none  DESCRIPTION OF PROCEDURE: After the risks benefits and alternatives of the procedure were thoroughly explained, informed consent was obtained.  The LB [id number] endoscope was introduced through the mouth and advanced to the second portion of the duodenum. Without limitations.  The instrument was slowly withdrawn as the mucosa was fully examined.    The esophagus was normal.  The stomach was normal.  The duodenal bulb had an area of nodular mucosa (normal versus polyp) which was biopsied.  The duodenum was otherwise normal.  Retroflexed views revealed no abnormalities.     The scope was then withdrawn from the patient and the procedure completed.  COMPLICATIONS: There were no complications. ENDOSCOPIC IMPRESSION: 1. GERD 2. Nodular duodenal mucosa status post biopsy  RECOMMENDATIONS: 1.  Anti-reflux regimen to be followed 2.  Await biopsy results 3.  Continue Prilosec daily to control reflux symptoms  REPEAT EXAM:  eSigned:  Eustace Quail, MD 07/11/2013 3:23 PM   BL:[id number] [name], MD and The Patient

## 2013-07-11 NOTE — Progress Notes (Signed)
Report to PACU, RN, vss, BBS= Clear.  

## 2013-07-11 NOTE — Progress Notes (Signed)
Called to room to assist during endoscopic procedure.  Patient ID and intended procedure confirmed with present staff. Received instructions for my participation in the procedure from the performing physician.  

## 2013-07-11 NOTE — Op Note (Signed)
[place]  [address]. [place], [id number]   COLONOSCOPY PROCEDURE REPORT  PATIENT: Eugene Castaneda, Eugene Castaneda  MR#: [id number] BIRTHDATE: 04-Jun-1945 , 67  yrs. old GENDER: Male ENDOSCOPIST: Eustace Quail, MD REFERRED BF:[id number] [name], M.D. PROCEDURE DATE:  07/11/2013 PROCEDURE:   Colonoscopy with snare polypectomy x 3 First Screening Colonoscopy - Avg.  risk and is 50 yrs.  old or older - No.  Prior Negative Screening - Now for repeat screening. N/A  History of Adenoma - Now for follow-up colonoscopy & has been > or = to 3 yrs.  N/A  Polyps Removed Today? Yes. ASA CLASS:   Class II INDICATIONS:heme-positive stool.. Reports colonoscopy 6 years ago with "polyps" (elsewhere) MEDICATIONS: MAC sedation, administered by CRNA and propofol (Diprivan) 320mg  IV DESCRIPTION OF PROCEDURE:   After the risks benefits and alternatives of the procedure were thoroughly explained, informed consent was obtained.  A digital rectal exam revealed no abnormalities of the rectum.   The LB [id number]  endoscope was introduced through the anus and advanced to the cecum, which was identified by both the appendix and ileocecal valve. No adverse events experienced.   The quality of the prep was excellent, using MoviPrep  The instrument was then slowly withdrawn as the colon was fully examined.  COLON FINDINGS: Three diminutive polyps were found in the ascending, transverse, and descending colon.  A polypectomy was performed with a cold snare.  The resection was complete and the polyp tissue was completely retrieved. 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   Moderate diverticulosis in the sigmoid colon. The colon mucosa was otherwise normal.  Retroflexed views revealed internal hemorrhoids. The time to cecum=4 min 26 sec.  Withdrawal time=12 min 38 sec.  The scope was withdrawn and the procedure completed. COMPLICATIONS: There were no complications.  ENDOSCOPIC  IMPRESSION: 1.   Three diminutive polyps were found in the  colon; polypectomy was performed with a cold snare 2.   Cecal AVMs 3.   Moderate diverticulosis was noted in the sigmoid colon  RECOMMENDATIONS: 1.  Follow up colonoscopy in 5 years 2.  Upper endoscopy today (see report)   eSigned:  Eustace Quail, MD 07/11/2013 3:17 PM   cc: [name], MD and The Patient

## 2013-07-11 NOTE — Patient Instructions (Addendum)
YOU HAD AN ENDOSCOPIC PROCEDURE TODAY AT [facility]: Refer to the procedure report that was given to you for any specific questions about what was found during the examination.  If the procedure report does not answer your questions, please call your gastroenterologist to clarify.  If you requested that your care partner not be given the details of your procedure findings, then the procedure report has been included in a sealed envelope for you to review at your convenience later.  YOU SHOULD EXPECT: Some feelings of bloating in the abdomen. Passage of more gas than usual.  Walking can help get rid of the air that was put into your GI tract during the procedure and reduce the bloating. If you had a lower endoscopy (such as a colonoscopy or flexible sigmoidoscopy) you may notice spotting of blood in your stool or on the toilet paper. If you underwent a bowel prep for your procedure, then you may not have a normal bowel movement for a few days.  DIET: Your first meal following the procedure should be a light meal and then it is ok to progress to your normal diet.  A half-sandwich or bowl of soup is an example of a good first meal.  Heavy or fried foods are harder to digest and may make you feel nauseous or bloated.  Likewise meals heavy in dairy and vegetables can cause extra gas to form and this can also increase the bloating.  Drink plenty of fluids but you should avoid alcoholic beverages for 24 hours. Try to eat a high fiber diet due to your Diverticulosis.   Also, try to follow an anti GERD diet.  ACTIVITY: Your care partner should take you home directly after the procedure.  You should plan to take it easy, moving slowly for the rest of the day.  You can resume normal activity the day after the procedure however you should NOT DRIVE or use heavy machinery for 24 hours (because of the sedation medicines used during the test).    SYMPTOMS TO REPORT IMMEDIATELY: A gastroenterologist can  be reached at any hour.  During normal business hours, 8:30 AM to 5:00 PM Monday through Friday, call [phone].  After hours and on weekends, please call the GI answering service at [phone] who will take a message and have the physician on call contact you.   Following lower endoscopy (colonoscopy or flexible sigmoidoscopy):  Excessive amounts of blood in the stool  Significant tenderness or worsening of abdominal pains  Swelling of the abdomen that is new, acute  Fever of 100F or higher  Following upper endoscopy (EGD)  Vomiting of blood or coffee ground material  New chest pain or pain under the shoulder blades  Painful or persistently difficult swallowing  New shortness of breath  Fever of 100F or higher  Black, tarry-looking stools  FOLLOW UP: If any biopsies were taken you will be contacted by phone or by letter within the next 1-3 weeks.  Call your gastroenterologist if you have not heard about the biopsies in 3 weeks.  Our staff will call the home number listed on your records the next business day following your procedure to check on you and address any questions or concerns that you may have at that time regarding the information given to you following your procedure. This is a courtesy call and so if there is no answer at the home number and we have not heard from you through the emergency physician on  call, we will assume that you have returned to your regular daily activities without incident.  SIGNATURES/CONFIDENTIALITY: You and/or your care partner have signed paperwork which will be entered into your electronic medical record.  These signatures attest to the fact that that the information above on your After Visit Summary has been reviewed and is understood.  Full responsibility of the confidentiality of this discharge information lies with you and/or your care-partner.  Continue your prilosec daily.  Read your handouts on diverticulosis, polyps and GERD.

## 2013-07-12 ENCOUNTER — Telehealth: Payer: Self-pay

## 2013-07-12 NOTE — Telephone Encounter (Signed)
  Follow up Call-  Call back number 07/11/2013  Post procedure Call Back phone  # [phone]  Permission to leave phone message Yes     Patient questions:  Do you have a fever, pain , or abdominal swelling? no Pain Score  0 *  Have you tolerated food without any problems? yes  Have you been able to return to your normal activities? yes  Do you have any questions about your discharge instructions: Diet   no Medications  no Follow up visit  no  Do you have questions or concerns about your Care? no  Actions: * If pain score is 4 or above: No action needed, pain <4.

## 2013-07-16 ENCOUNTER — Encounter: Payer: Self-pay | Admitting: Internal Medicine

## 2013-07-25 DIAGNOSIS — Z8546 Personal history of malignant neoplasm of prostate: Secondary | ICD-10-CM | POA: Diagnosis not present

## 2013-07-25 DIAGNOSIS — N529 Male erectile dysfunction, unspecified: Secondary | ICD-10-CM | POA: Diagnosis not present

## 2013-07-25 DIAGNOSIS — N393 Stress incontinence (female) (male): Secondary | ICD-10-CM | POA: Diagnosis not present

## 2013-07-25 DIAGNOSIS — R339 Retention of urine, unspecified: Secondary | ICD-10-CM | POA: Diagnosis not present

## 2013-07-25 DIAGNOSIS — N35919 Unspecified urethral stricture, male, unspecified site: Secondary | ICD-10-CM | POA: Diagnosis not present

## 2013-07-25 DIAGNOSIS — N189 Chronic kidney disease, unspecified: Secondary | ICD-10-CM | POA: Diagnosis not present

## 2013-08-19 DIAGNOSIS — IMO0002 Reserved for concepts with insufficient information to code with codable children: Secondary | ICD-10-CM | POA: Diagnosis not present

## 2013-08-19 DIAGNOSIS — Z7982 Long term (current) use of aspirin: Secondary | ICD-10-CM | POA: Diagnosis not present

## 2013-08-19 DIAGNOSIS — C61 Malignant neoplasm of prostate: Secondary | ICD-10-CM | POA: Diagnosis not present

## 2013-08-19 DIAGNOSIS — N529 Male erectile dysfunction, unspecified: Secondary | ICD-10-CM | POA: Diagnosis not present

## 2013-08-19 DIAGNOSIS — Z79899 Other long term (current) drug therapy: Secondary | ICD-10-CM | POA: Diagnosis not present

## 2013-08-19 DIAGNOSIS — N393 Stress incontinence (female) (male): Secondary | ICD-10-CM | POA: Diagnosis not present

## 2013-08-19 DIAGNOSIS — I10 Essential (primary) hypertension: Secondary | ICD-10-CM | POA: Diagnosis not present

## 2013-08-19 DIAGNOSIS — I4891 Unspecified atrial fibrillation: Secondary | ICD-10-CM | POA: Diagnosis not present

## 2013-09-27 DIAGNOSIS — I4891 Unspecified atrial fibrillation: Secondary | ICD-10-CM | POA: Diagnosis not present

## 2013-09-27 DIAGNOSIS — Z79899 Other long term (current) drug therapy: Secondary | ICD-10-CM | POA: Diagnosis not present

## 2013-09-27 DIAGNOSIS — I495 Sick sinus syndrome: Secondary | ICD-10-CM | POA: Diagnosis not present

## 2013-09-27 DIAGNOSIS — N183 Chronic kidney disease, stage 3 unspecified: Secondary | ICD-10-CM | POA: Diagnosis not present

## 2013-09-27 DIAGNOSIS — E785 Hyperlipidemia, unspecified: Secondary | ICD-10-CM | POA: Diagnosis not present

## 2013-09-27 DIAGNOSIS — I10 Essential (primary) hypertension: Secondary | ICD-10-CM | POA: Diagnosis not present

## 2013-10-01 DIAGNOSIS — Z1331 Encounter for screening for depression: Secondary | ICD-10-CM | POA: Diagnosis not present

## 2013-10-01 DIAGNOSIS — R350 Frequency of micturition: Secondary | ICD-10-CM | POA: Diagnosis not present

## 2013-10-01 DIAGNOSIS — R82998 Other abnormal findings in urine: Secondary | ICD-10-CM | POA: Diagnosis not present

## 2013-10-01 DIAGNOSIS — N183 Chronic kidney disease, stage 3 unspecified: Secondary | ICD-10-CM | POA: Diagnosis not present

## 2013-10-01 DIAGNOSIS — Z6828 Body mass index (BMI) 28.0-28.9, adult: Secondary | ICD-10-CM | POA: Diagnosis not present

## 2013-10-01 DIAGNOSIS — I4891 Unspecified atrial fibrillation: Secondary | ICD-10-CM | POA: Diagnosis not present

## 2013-10-01 DIAGNOSIS — E039 Hypothyroidism, unspecified: Secondary | ICD-10-CM | POA: Diagnosis not present

## 2013-10-01 DIAGNOSIS — I10 Essential (primary) hypertension: Secondary | ICD-10-CM | POA: Diagnosis not present

## 2013-10-01 DIAGNOSIS — R7301 Impaired fasting glucose: Secondary | ICD-10-CM | POA: Diagnosis not present

## 2013-10-08 DIAGNOSIS — IMO0002 Reserved for concepts with insufficient information to code with codable children: Secondary | ICD-10-CM | POA: Diagnosis not present

## 2013-10-08 DIAGNOSIS — R339 Retention of urine, unspecified: Secondary | ICD-10-CM | POA: Diagnosis not present

## 2013-10-08 DIAGNOSIS — N529 Male erectile dysfunction, unspecified: Secondary | ICD-10-CM | POA: Diagnosis not present

## 2013-10-08 DIAGNOSIS — N393 Stress incontinence (female) (male): Secondary | ICD-10-CM | POA: Diagnosis not present

## 2013-10-10 DIAGNOSIS — H43399 Other vitreous opacities, unspecified eye: Secondary | ICD-10-CM | POA: Diagnosis not present

## 2013-10-10 DIAGNOSIS — H43819 Vitreous degeneration, unspecified eye: Secondary | ICD-10-CM | POA: Diagnosis not present

## 2013-10-11 DIAGNOSIS — N189 Chronic kidney disease, unspecified: Secondary | ICD-10-CM | POA: Diagnosis not present

## 2013-10-11 DIAGNOSIS — N529 Male erectile dysfunction, unspecified: Secondary | ICD-10-CM | POA: Diagnosis not present

## 2013-10-11 DIAGNOSIS — N393 Stress incontinence (female) (male): Secondary | ICD-10-CM | POA: Diagnosis not present

## 2013-10-11 DIAGNOSIS — C61 Malignant neoplasm of prostate: Secondary | ICD-10-CM | POA: Diagnosis not present

## 2013-10-11 DIAGNOSIS — R339 Retention of urine, unspecified: Secondary | ICD-10-CM | POA: Diagnosis not present

## 2013-10-11 DIAGNOSIS — IMO0002 Reserved for concepts with insufficient information to code with codable children: Secondary | ICD-10-CM | POA: Diagnosis not present

## 2013-10-21 DIAGNOSIS — Z23 Encounter for immunization: Secondary | ICD-10-CM | POA: Diagnosis not present

## 2013-10-28 DIAGNOSIS — R079 Chest pain, unspecified: Secondary | ICD-10-CM | POA: Diagnosis not present

## 2013-10-28 DIAGNOSIS — E785 Hyperlipidemia, unspecified: Secondary | ICD-10-CM | POA: Diagnosis not present

## 2013-10-28 DIAGNOSIS — Z01818 Encounter for other preprocedural examination: Secondary | ICD-10-CM | POA: Diagnosis not present

## 2013-10-28 DIAGNOSIS — N183 Chronic kidney disease, stage 3 unspecified: Secondary | ICD-10-CM | POA: Diagnosis not present

## 2013-10-28 DIAGNOSIS — Z8546 Personal history of malignant neoplasm of prostate: Secondary | ICD-10-CM | POA: Diagnosis not present

## 2013-10-28 DIAGNOSIS — E039 Hypothyroidism, unspecified: Secondary | ICD-10-CM | POA: Diagnosis not present

## 2013-10-28 DIAGNOSIS — Z79899 Other long term (current) drug therapy: Secondary | ICD-10-CM | POA: Diagnosis not present

## 2013-10-28 DIAGNOSIS — IMO0002 Reserved for concepts with insufficient information to code with codable children: Secondary | ICD-10-CM | POA: Diagnosis not present

## 2013-10-28 DIAGNOSIS — I10 Essential (primary) hypertension: Secondary | ICD-10-CM | POA: Diagnosis not present

## 2013-10-28 DIAGNOSIS — N393 Stress incontinence (female) (male): Secondary | ICD-10-CM | POA: Diagnosis not present

## 2013-10-28 DIAGNOSIS — K219 Gastro-esophageal reflux disease without esophagitis: Secondary | ICD-10-CM | POA: Diagnosis not present

## 2013-11-04 DIAGNOSIS — E785 Hyperlipidemia, unspecified: Secondary | ICD-10-CM | POA: Diagnosis not present

## 2013-11-04 DIAGNOSIS — N189 Chronic kidney disease, unspecified: Secondary | ICD-10-CM | POA: Diagnosis not present

## 2013-11-04 DIAGNOSIS — K219 Gastro-esophageal reflux disease without esophagitis: Secondary | ICD-10-CM | POA: Diagnosis not present

## 2013-11-04 DIAGNOSIS — I129 Hypertensive chronic kidney disease with stage 1 through stage 4 chronic kidney disease, or unspecified chronic kidney disease: Secondary | ICD-10-CM | POA: Diagnosis not present

## 2013-11-04 DIAGNOSIS — E039 Hypothyroidism, unspecified: Secondary | ICD-10-CM | POA: Diagnosis not present

## 2013-11-04 DIAGNOSIS — N35013 Post-traumatic anterior urethral stricture: Secondary | ICD-10-CM | POA: Diagnosis not present

## 2013-11-04 DIAGNOSIS — N359 Urethral stricture, unspecified: Secondary | ICD-10-CM | POA: Diagnosis not present

## 2013-11-05 DIAGNOSIS — E039 Hypothyroidism, unspecified: Secondary | ICD-10-CM | POA: Diagnosis not present

## 2013-11-05 DIAGNOSIS — K219 Gastro-esophageal reflux disease without esophagitis: Secondary | ICD-10-CM | POA: Diagnosis not present

## 2013-11-05 DIAGNOSIS — N189 Chronic kidney disease, unspecified: Secondary | ICD-10-CM | POA: Diagnosis not present

## 2013-11-05 DIAGNOSIS — N359 Urethral stricture, unspecified: Secondary | ICD-10-CM | POA: Diagnosis not present

## 2013-11-05 DIAGNOSIS — I129 Hypertensive chronic kidney disease with stage 1 through stage 4 chronic kidney disease, or unspecified chronic kidney disease: Secondary | ICD-10-CM | POA: Diagnosis not present

## 2013-11-05 DIAGNOSIS — E785 Hyperlipidemia, unspecified: Secondary | ICD-10-CM | POA: Diagnosis not present

## 2013-11-18 DIAGNOSIS — C61 Malignant neoplasm of prostate: Secondary | ICD-10-CM | POA: Diagnosis not present

## 2013-11-29 DIAGNOSIS — N35013 Post-traumatic anterior urethral stricture: Secondary | ICD-10-CM | POA: Diagnosis not present

## 2013-11-29 DIAGNOSIS — N189 Chronic kidney disease, unspecified: Secondary | ICD-10-CM | POA: Diagnosis not present

## 2013-11-29 DIAGNOSIS — Z9889 Other specified postprocedural states: Secondary | ICD-10-CM | POA: Diagnosis not present

## 2013-11-29 DIAGNOSIS — N359 Urethral stricture, unspecified: Secondary | ICD-10-CM | POA: Diagnosis not present

## 2013-11-29 DIAGNOSIS — N393 Stress incontinence (female) (male): Secondary | ICD-10-CM | POA: Diagnosis not present

## 2014-02-10 DIAGNOSIS — N35011 Post-traumatic bulbous urethral stricture: Secondary | ICD-10-CM | POA: Diagnosis not present

## 2014-02-10 DIAGNOSIS — C61 Malignant neoplasm of prostate: Secondary | ICD-10-CM | POA: Diagnosis not present

## 2014-02-10 DIAGNOSIS — N528 Other male erectile dysfunction: Secondary | ICD-10-CM | POA: Diagnosis not present

## 2014-02-10 DIAGNOSIS — N393 Stress incontinence (female) (male): Secondary | ICD-10-CM | POA: Diagnosis not present

## 2014-03-04 DIAGNOSIS — N35011 Post-traumatic bulbous urethral stricture: Secondary | ICD-10-CM | POA: Diagnosis not present

## 2014-03-04 DIAGNOSIS — Z8546 Personal history of malignant neoplasm of prostate: Secondary | ICD-10-CM | POA: Diagnosis not present

## 2014-03-04 DIAGNOSIS — N528 Other male erectile dysfunction: Secondary | ICD-10-CM | POA: Diagnosis not present

## 2014-03-04 DIAGNOSIS — R339 Retention of urine, unspecified: Secondary | ICD-10-CM | POA: Diagnosis not present

## 2014-03-04 DIAGNOSIS — N393 Stress incontinence (female) (male): Secondary | ICD-10-CM | POA: Diagnosis not present

## 2014-03-04 DIAGNOSIS — N189 Chronic kidney disease, unspecified: Secondary | ICD-10-CM | POA: Diagnosis not present

## 2014-03-28 DIAGNOSIS — Z79899 Other long term (current) drug therapy: Secondary | ICD-10-CM | POA: Diagnosis not present

## 2014-03-28 DIAGNOSIS — I495 Sick sinus syndrome: Secondary | ICD-10-CM | POA: Diagnosis not present

## 2014-03-28 DIAGNOSIS — I480 Paroxysmal atrial fibrillation: Secondary | ICD-10-CM | POA: Diagnosis not present

## 2014-03-28 DIAGNOSIS — I10 Essential (primary) hypertension: Secondary | ICD-10-CM | POA: Diagnosis not present

## 2014-03-28 DIAGNOSIS — E785 Hyperlipidemia, unspecified: Secondary | ICD-10-CM | POA: Diagnosis not present

## 2014-03-28 DIAGNOSIS — N183 Chronic kidney disease, stage 3 (moderate): Secondary | ICD-10-CM | POA: Diagnosis not present

## 2014-04-21 DIAGNOSIS — R739 Hyperglycemia, unspecified: Secondary | ICD-10-CM | POA: Diagnosis not present

## 2014-04-21 DIAGNOSIS — E039 Hypothyroidism, unspecified: Secondary | ICD-10-CM | POA: Diagnosis not present

## 2014-04-21 DIAGNOSIS — E785 Hyperlipidemia, unspecified: Secondary | ICD-10-CM | POA: Diagnosis not present

## 2014-04-21 DIAGNOSIS — Z125 Encounter for screening for malignant neoplasm of prostate: Secondary | ICD-10-CM | POA: Diagnosis not present

## 2014-04-21 DIAGNOSIS — I10 Essential (primary) hypertension: Secondary | ICD-10-CM | POA: Diagnosis not present

## 2014-04-28 DIAGNOSIS — Z1212 Encounter for screening for malignant neoplasm of rectum: Secondary | ICD-10-CM | POA: Diagnosis not present

## 2014-04-28 DIAGNOSIS — K219 Gastro-esophageal reflux disease without esophagitis: Secondary | ICD-10-CM | POA: Diagnosis not present

## 2014-04-28 DIAGNOSIS — Z0000 Encounter for general adult medical examination without abnormal findings: Secondary | ICD-10-CM | POA: Diagnosis not present

## 2014-04-28 DIAGNOSIS — C61 Malignant neoplasm of prostate: Secondary | ICD-10-CM | POA: Diagnosis not present

## 2014-04-28 DIAGNOSIS — Z1389 Encounter for screening for other disorder: Secondary | ICD-10-CM | POA: Diagnosis not present

## 2014-04-28 DIAGNOSIS — R32 Unspecified urinary incontinence: Secondary | ICD-10-CM | POA: Diagnosis not present

## 2014-04-28 DIAGNOSIS — R7301 Impaired fasting glucose: Secondary | ICD-10-CM | POA: Diagnosis not present

## 2014-04-28 DIAGNOSIS — N183 Chronic kidney disease, stage 3 (moderate): Secondary | ICD-10-CM | POA: Diagnosis not present

## 2014-04-28 DIAGNOSIS — Z23 Encounter for immunization: Secondary | ICD-10-CM | POA: Diagnosis not present

## 2014-04-28 DIAGNOSIS — M1990 Unspecified osteoarthritis, unspecified site: Secondary | ICD-10-CM | POA: Diagnosis not present

## 2014-04-28 DIAGNOSIS — Z6829 Body mass index (BMI) 29.0-29.9, adult: Secondary | ICD-10-CM | POA: Diagnosis not present

## 2014-04-28 DIAGNOSIS — I480 Paroxysmal atrial fibrillation: Secondary | ICD-10-CM | POA: Diagnosis not present

## 2014-04-28 DIAGNOSIS — E039 Hypothyroidism, unspecified: Secondary | ICD-10-CM | POA: Diagnosis not present

## 2014-04-28 DIAGNOSIS — R8299 Other abnormal findings in urine: Secondary | ICD-10-CM | POA: Diagnosis not present

## 2014-04-28 DIAGNOSIS — I10 Essential (primary) hypertension: Secondary | ICD-10-CM | POA: Diagnosis not present

## 2014-05-09 DIAGNOSIS — N35013 Post-traumatic anterior urethral stricture: Secondary | ICD-10-CM | POA: Diagnosis not present

## 2014-05-09 DIAGNOSIS — N393 Stress incontinence (female) (male): Secondary | ICD-10-CM | POA: Diagnosis not present

## 2014-05-09 DIAGNOSIS — N5231 Erectile dysfunction following radical prostatectomy: Secondary | ICD-10-CM | POA: Diagnosis not present

## 2014-05-09 DIAGNOSIS — Z8546 Personal history of malignant neoplasm of prostate: Secondary | ICD-10-CM | POA: Diagnosis not present

## 2014-05-09 DIAGNOSIS — N183 Chronic kidney disease, stage 3 (moderate): Secondary | ICD-10-CM | POA: Diagnosis not present

## 2014-05-12 DIAGNOSIS — C61 Malignant neoplasm of prostate: Secondary | ICD-10-CM | POA: Diagnosis not present

## 2014-05-12 DIAGNOSIS — N393 Stress incontinence (female) (male): Secondary | ICD-10-CM | POA: Diagnosis not present

## 2014-05-19 DIAGNOSIS — C61 Malignant neoplasm of prostate: Secondary | ICD-10-CM | POA: Diagnosis not present

## 2014-06-02 DIAGNOSIS — H532 Diplopia: Secondary | ICD-10-CM | POA: Diagnosis not present

## 2014-06-03 DIAGNOSIS — I480 Paroxysmal atrial fibrillation: Secondary | ICD-10-CM | POA: Diagnosis not present

## 2014-06-03 DIAGNOSIS — Z6828 Body mass index (BMI) 28.0-28.9, adult: Secondary | ICD-10-CM | POA: Diagnosis not present

## 2014-06-03 DIAGNOSIS — C61 Malignant neoplasm of prostate: Secondary | ICD-10-CM | POA: Diagnosis not present

## 2014-06-03 DIAGNOSIS — H532 Diplopia: Secondary | ICD-10-CM | POA: Diagnosis not present

## 2014-06-03 DIAGNOSIS — I10 Essential (primary) hypertension: Secondary | ICD-10-CM | POA: Diagnosis not present

## 2014-06-04 ENCOUNTER — Ambulatory Visit (INDEPENDENT_AMBULATORY_CARE_PROVIDER_SITE_OTHER): Payer: Medicare Other | Admitting: Neurology

## 2014-06-04 ENCOUNTER — Encounter: Payer: Self-pay | Admitting: Neurology

## 2014-06-04 VITALS — BP 134/68 | HR 51 | Ht 71.5 in | Wt 204.0 lb

## 2014-06-04 DIAGNOSIS — R799 Abnormal finding of blood chemistry, unspecified: Secondary | ICD-10-CM | POA: Diagnosis not present

## 2014-06-04 DIAGNOSIS — H532 Diplopia: Secondary | ICD-10-CM | POA: Diagnosis not present

## 2014-06-04 DIAGNOSIS — G7000 Myasthenia gravis without (acute) exacerbation: Secondary | ICD-10-CM | POA: Diagnosis not present

## 2014-06-04 MED ORDER — PYRIDOSTIGMINE BROMIDE 60 MG PO TABS: 60.0000 mg | ORAL_TABLET | Freq: Three times a day (TID) | ORAL | Status: DC

## 2014-06-04 NOTE — Progress Notes (Signed)
PATIENT: Eugene Castaneda DOB: 12-05-1945  HISTORICAL  Eugene Castaneda is a 69 year old right-handed gentleman referred by his primary care physician Dr. Dagmar Hait for evaluation of double vision  He has history of atrial fibrillation, status post ablation, taking flecanide, and aspirin 81, hypertension, prostate cancer, is taking Lupron,  Since April 2016 he noticed intermittent double vision, become most obvious in May 29 2014, while he was looking to the right side talking with somebody, he noticed oblique double vision, he also noticed intermittent left droopy eyelid  He denies dysarthria, no dysphagia, did not notice any limb muscle weakness,  He has a history of prostate cancer, planning on to have urethral reconstruction surgery by [facility] urologist Dr.Terlecki  REVIEW OF SYSTEMS: Full 14 system review of systems performed and notable only for double vision, incontinence, impotence  ALLERGIES: No Known Allergies  HOME MEDICATIONS: Current Outpatient Prescriptions  Medication Sig Dispense Refill  . aspirin 81 MG tablet Take 81 mg by mouth daily.    . cholecalciferol (VITAMIN D) 1000 UNITS tablet Take 1,000 Units by mouth daily.    . fenofibrate 160 MG tablet Take 160 mg by mouth daily.    . Fish Oil OIL 1 tablet by Does not apply route daily.    . flecainide (TAMBOCOR) 50 MG tablet Take 50 mg by mouth 2 (two) times daily.    Marland Kitchen Leuprolide Acetate (LUPRON IJ) Inject as directed every 3 (three) months.    . levothyroxine (SYNTHROID, LEVOTHROID) 175 MCG tablet Take 175 mcg by mouth daily before breakfast.    . lisinopril (PRINIVIL,ZESTRIL) 20 MG tablet Take 20 mg by mouth daily.    . Multiple Vitamin (MULTIVITAMIN) tablet Take 1 tablet by mouth daily.    Marland Kitchen omeprazole (PRILOSEC) 20 MG capsule Take 20 mg by mouth daily.    . Pomegranate, Punica granatum, 150 MG CAPS Take 1 tablet by mouth daily.       PAST MEDICAL HISTORY: Past Medical History  Diagnosis Date  .  Hypothyroidism   . Hypertension   . Hyperlipemia   . Prostate cancer   . Urethral obstruction   . Atrial fibrillation   . GERD (gastroesophageal reflux disease)   . Impaired fasting glucose   . Diplopia     PAST SURGICAL HISTORY: Past Surgical History  Procedure Laterality Date  . Prostatectomy    . Catheter ablationfor afib    . Tonsillectomy    . Colonoscopy      FAMILY HISTORY: Family History  Problem Relation Age of Onset  . Parkinson's disease Father   . Breast cancer Mother     SOCIAL HISTORY:  History   Social History  . Marital Status: Married    Spouse Name: N/A  . Number of Children: 2  . Years of Education: Masters   Occupational History  . Retired from Network engineer job    Social History Main Topics  . Smoking status: Former Smoker    Quit date: 02/01/1980  . Smokeless tobacco: Not on file  . Alcohol Use: 0.0 oz/week    0 Standard drinks or equivalent per week     Comment: 2 glasses red wine daily  . Drug Use: No  . Sexual Activity: Not on file   Other Topics Concern  . Not on file   Social History Narrative   Lives at home with spouse.   Right-handed.   2 cups caffeine daily.     PHYSICAL EXAM   Filed Vitals:   06/04/14 1214  BP: 134/68  Pulse: 51  Height: 5' 11.5" (1.816 m)  Weight: 204 lb (92.534 kg)    Not recorded      Body mass index is 28.06 kg/(m^2).  PHYSICAL EXAMNIATION:  Gen: NAD, conversant, well nourised, obese, well groomed                     Cardiovascular: Regular rate rhythm, no peripheral edema, warm, nontender. Eyes: Conjunctivae clear without exudates or hemorrhage Neck: Supple, no carotid bruise. Pulmonary: Clear to auscultation bilaterally   NEUROLOGICAL EXAM:  MENTAL STATUS: Speech:    Speech is normal; fluent and spontaneous with normal comprehension.  Cognition:    The patient is oriented to person, place, and time;     recent and remote memory intact;     language fluent;     normal attention,  concentration,     fund of knowledge.  CRANIAL NERVES: CN II: Visual fields are full to confrontation. Fundoscopic exam is normal with sharp discs and no vascular changes. Venous pulsations are present bilaterally. Pupils are 4 mm and briskly reactive to light. Visual acuity is 20/20 bilaterally. CN III, IV, VI: extraocular movement are normal. He has fatigable left ptosis, to the upper edge of left pupil. Rent less testing, cover and uncover testing demonstrated mild bilateral lateral rectus weakness, with bilateral exophoria CN V: Facial sensation is intact to pinprick in all 3 divisions bilaterally. Corneal responses are intact.  CN VII: Face is symmetric with mild to moderate eye closure and cheek puff weakness CN VIII: Hearing is normal to rubbing fingers CN IX, X: Palate elevates symmetrically. Phonation is normal. CN XI: Head turning and shoulder shrug are intact CN XII: Tongue is midline with normal movements and no atrophy.  MOTOR: There is no pronator drift of out-stretched arms. Muscle bulk and tone are normal. He has mild neck flexion weakness  Shoulder abduction Shoulder external rotation Elbow flexion Elbow extension Wrist flexion Wrist extension Finger abduction Hip flexion Knee flexion Knee extension Ankle dorsi flexion Ankle plantar flexion  R _0 5- 5  L _1 5- 5    REFLEXES: Reflexes are 2+ and symmetric at the biceps, triceps, knees, and ankles. Plantar responses are flexor.  SENSORY: Light touch, pinprick, position sense, and vibration sense are intact in fingers and toes.  COORDINATION: Rapid alternating movements and fine finger movements are intact. There is no dysmetria on finger-to-nose and heel-knee-shin. There are no abnormal or extraneous movements.   GAIT/STANCE: Posture is normal. Gait is steady with normal steps, base, arm swing, and turning. Heel and toe walking are normal. Tandem gait is normal.  Romberg is  absent.   DIAGNOSTIC DATA (LABS, IMAGING, TESTING) - I reviewed patient records, labs, notes, testing and imaging myself where available.  Lab Results  Component Value Date   WBC 6.2 04/02/2008   HGB 14.7 04/02/2008   HCT 42.0 04/02/2008   MCV 93.7 04/02/2008   PLT 317 04/02/2008      Component Value Date/Time   NA 137 04/02/2008 1215   K 4.4 04/02/2008 1215   CL 103 04/02/2008 1215   CO2 25 04/02/2008 1215   GLUCOSE 89 04/02/2008 1215   BUN 15 04/02/2008 1215   CREATININE 1.34 04/02/2008 1215   CALCIUM 9.2 04/02/2008 1215   PROT 6.9 04/02/2008 1215   ALBUMIN 4.3 04/02/2008 1215   AST 26 04/02/2008 1215  ALT 22 04/02/2008 1215   ALKPHOS 31* 04/02/2008 1215   BILITOT 0.7 04/02/2008 1215   GFRNONAA 54* 04/02/2008 1215   GFRAA  04/02/2008 1215    >60        The eGFR has been calculated using the MDRD equation. This calculation has not been validated in all clinical situations. eGFR's persistently <60 mL/min signify possible Chronic Kidney Disease.    ASSESSMENT AND PLAN  Eugene Castaneda is a 69 y.o. male  with fatigable left ptosis, mild to moderate facial, limb muscle weakness, most consistent with neuromuscular junctional disorder, myasthenia gravis  1. Laboratory evaluation, including acetylcholine receptor antibody 2, MRI of brain 3 CT chest without contrast 4. Mestinon 60 mg 3 times a day  Marcial Pacas, M.D. Ph.D.  [facility] [address] [place], [place] [address] Ph: [phone] Fax: [phone]

## 2014-06-05 ENCOUNTER — Telehealth: Payer: Self-pay | Admitting: Neurology

## 2014-06-05 LAB — ACETYLCHOLINE RECEPTOR, BINDING: ACHR BINDING AB, SERUM: 3.71 nmol/L — AB (ref 0.00–0.24)

## 2014-06-05 NOTE — Telephone Encounter (Signed)
Eugene Castaneda, please call patient, acetylcholine receptor antibody was positive, confirmed the diagnosis of serum positive generalized myasthenia gravis, keep plan as originally discussed, and also follow-up appointment,

## 2014-06-05 NOTE — Telephone Encounter (Signed)
Pt aware of results and will keep follow up appt.

## 2014-06-06 ENCOUNTER — Ambulatory Visit
Admission: RE | Admit: 2014-06-06 | Discharge: 2014-06-06 | Disposition: A | Discharge status: Home / Self Care | Payer: Medicare Other | Source: Ambulatory Visit | Attending: Neurology | Admitting: Neurology

## 2014-06-06 ENCOUNTER — Other Ambulatory Visit: Payer: PRIVATE HEALTH INSURANCE

## 2014-06-06 ENCOUNTER — Ambulatory Visit
Admission: RE | Admit: 2014-06-06 | Discharge: 2014-06-06 | Disposition: A | Discharge status: Home / Self Care | Payer: PRIVATE HEALTH INSURANCE | Source: Ambulatory Visit | Attending: Neurology | Admitting: Neurology

## 2014-06-06 DIAGNOSIS — G7000 Myasthenia gravis without (acute) exacerbation: Secondary | ICD-10-CM

## 2014-06-06 DIAGNOSIS — H532 Diplopia: Secondary | ICD-10-CM

## 2014-06-13 ENCOUNTER — Telehealth: Payer: Self-pay | Admitting: Neurology

## 2014-06-13 ENCOUNTER — Encounter: Payer: Self-pay | Admitting: Neurology

## 2014-06-13 ENCOUNTER — Ambulatory Visit (INDEPENDENT_AMBULATORY_CARE_PROVIDER_SITE_OTHER): Payer: Medicare Other | Admitting: Neurology

## 2014-06-13 VITALS — BP 143/66 | HR 50 | Ht 71.5 in | Wt 210.0 lb

## 2014-06-13 DIAGNOSIS — G7000 Myasthenia gravis without (acute) exacerbation: Secondary | ICD-10-CM

## 2014-06-13 MED ORDER — AZATHIOPRINE 100 MG PO TABS: ORAL_TABLET | ORAL | Status: DC

## 2014-06-13 MED ORDER — PREDNISONE 10 MG PO TABS: ORAL_TABLET | ORAL | Status: DC

## 2014-06-13 NOTE — Telephone Encounter (Addendum)
Letter to his urologist Dr. Odis Luster was faxed.

## 2014-06-13 NOTE — Progress Notes (Signed)
PATIENT: Eugene Castaneda DOB: [birth year]  HISTORICAL( initial visit May 4th 2016)  Eugene Castaneda is a 69 year old right-handed gentleman referred by his primary care physician Dr. 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,   Since April 2016 he noticed intermittent double vision, become most obvious in May 29 2014, while he was looking to the right side talking with somebody, he noticed oblique double vision, he also noticed intermittent left droopy eyelid  He denies dysarthria, no dysphagia, did not notice any limb muscle weakness,  He has a history of prostate cancer, planning on to have urethral reconstruction surgery by [facility] urologist Dr.Terlecki  UPDATE Jun 13 2014: I have reviewed her laboratory from primary care in Jun 03 2014, TSH was normal, slight elevated glucose 101, elevated creatinine 1.69, with GFR of 47, normal CBC, Acetylcholine receptor binding antibody was positive with titer of 3.71, MRI of the brain showed mild atrophy, CT chest show no evidence of thymoma  Patient has seropositive generalized myasthenia gravis, he has been taking Mestinon 60 mg 3 times a day, tolerating it well, does notice improvement of his vision, heaviness of his eyelid, he reported occasionally chewing difficulty, blurry vision looking far distance especially to the right side, no swallowing difficulty, no breathing difficulty, he exercise regularly, swimming long distance without difficulty, he did not notice any significant bleeding muscle weakness,  REVIEW OF SYSTEMS: Full 14 system review of systems performed and notable only for as above ALLERGIES: No Known Allergies  HOME MEDICATIONS: Current Outpatient  Prescriptions  Medication Sig Dispense Refill  . aspirin 81 MG tablet Take 81 mg by mouth daily.    . cholecalciferol (VITAMIN D) 1000 UNITS tablet Take 1,000 Units by mouth daily.    . fenofibrate 160 MG tablet Take 160 mg by mouth daily.    . Fish Oil OIL 1 tablet by Does not apply route daily.    . flecainide (TAMBOCOR) 50 MG tablet Take 50 mg by mouth 2 (two) times daily.    Marland Kitchen Leuprolide Acetate (LUPRON IJ) Inject as directed every 3 (three) months.    . levothyroxine (SYNTHROID, LEVOTHROID) 175 MCG tablet Take 175 mcg by mouth daily before breakfast.    . lisinopril (PRINIVIL,ZESTRIL) 20 MG tablet Take 20 mg by mouth daily.    . Multiple Vitamin (MULTIVITAMIN) tablet Take 1 tablet by mouth daily.    Marland Kitchen omeprazole (PRILOSEC) 20 MG capsule Take 20 mg by mouth daily.    . Pomegranate, Punica granatum, 150 MG CAPS Take 1 tablet by mouth daily.       PAST MEDICAL HISTORY: Past Medical History  Diagnosis Date  . Hypothyroidism   . Hypertension   . Hyperlipemia   . Prostate cancer   . Urethral obstruction   . Atrial fibrillation   . GERD (gastroesophageal reflux disease)   . Impaired fasting glucose   . Diplopia     PAST SURGICAL HISTORY: Past Surgical History  Procedure Laterality Date  . Prostatectomy    . Catheter ablationfor afib    . Tonsillectomy    . Colonoscopy      FAMILY HISTORY: Family History  Problem Relation Age of Onset  . Parkinson's disease Father   . Breast cancer Mother     SOCIAL HISTORY:  History   Social History  . Marital Status: Married    Spouse Name: N/A  . Number of Children: 2  . Years of Education: Masters   Occupational History  . Retired from Network engineer job    Social History Main Topics  . Smoking status: Former Smoker    Quit date: 02/01/1980  . Smokeless tobacco: Not on file  . Alcohol Use: 0.0 oz/week    0 Standard drinks or equivalent per week     Comment: 2 glasses red wine daily  . Drug Use: No  . Sexual Activity: Not on  file   Other Topics Concern  . Not on file   Social History Narrative   Lives at home with spouse.   Right-handed.   2 cups caffeine daily.     PHYSICAL EXAM   Filed Vitals:   06/13/14 0820  BP: 143/66  Pulse: 50  Height: 5' 11.5" (1.816 m)  Weight: 210 lb (95.255 kg)    Not recorded      Body mass index is 28.88 kg/(m^2).  PHYSICAL EXAMNIATION:  Gen: NAD, conversant, well nourised, obese, well groomed                     Cardiovascular: Regular rate rhythm, no peripheral edema, warm, nontender. Eyes: Conjunctivae clear without exudates or hemorrhage Neck: Supple, no carotid bruise. Pulmonary: Clear to auscultation bilaterally   NEUROLOGICAL EXAM:  MENTAL STATUS: Speech:    Speech is normal; fluent and spontaneous with normal comprehension.  Cognition:    The patient is oriented to person, place, and time;     recent and remote memory intact;     language fluent;     normal attention, concentration,     fund of knowledge.  CRANIAL NERVES: CN II: Visual fields are full to confrontation. Fundoscopic exam is normal with sharp discs and no vascular changes. Venous pulsations are present bilaterally. Pupils are 4 mm and briskly reactive to light.   CN III, IV, VI: extraocular movement are normal. I did not appreciate any ptosis today, Rent less testing, cover and uncover testing demonstrated mild bilateral exraocular muscle weakness, with right hyper/exophoria, left inferior/exophoria CN V: Facial sensation is intact to pinprick in all 3 divisions bilaterally. Corneal responses are intact.  CN VII: Face is symmetric with mild to moderate eye closure and cheek puff weakness CN VIII: Hearing is normal to rubbing fingers CN IX, X: Palate elevates symmetrically. Phonation is normal. CN XI: Head turning and shoulder shrug are intact CN XII: Tongue is midline with normal movements and no atrophy.  MOTOR: There is no pronator drift of out-stretched arms. Muscle bulk and  tone are normal. He has mild neck flexion weakness  Shoulder abduction Shoulder external rotation Elbow flexion Elbow extension Wrist flexion Wrist extension Finger abduction Hip flexion Knee flexion Knee extension Ankle dorsi flexion Ankle plantar flexion  R '4 4 5 5 5 5 5 4 5 5 ' 5- 5  L '4 4 5 5 5 5 5 4 5 5 ' 5- 5    REFLEXES: Reflexes are 2+ and symmetric at the biceps, triceps, knees, and ankles. Plantar responses are flexor.  SENSORY: Light touch, pinprick, position sense, and vibration sense are intact in fingers and toes.  COORDINATION: Rapid alternating movements and fine finger movements are intact. There is no dysmetria on finger-to-nose and heel-knee-shin. There are no abnormal or extraneous movements.   GAIT/STANCE: Posture is normal. Gait is steady with normal steps, base, arm swing,  and turning. Heel and toe walking are normal. Tandem gait is normal.  Romberg is absent.   DIAGNOSTIC DATA (LABS, IMAGING, TESTING) - I reviewed patient records, labs, notes, testing and imaging myself where available.  Lab Results  Component Value Date   WBC 6.2 04/02/2008   HGB 14.7 04/02/2008   HCT 42.0 04/02/2008   MCV 93.7 04/02/2008   PLT 317 04/02/2008      Component Value Date/Time   NA 137 04/02/2008 1215   K 4.4 04/02/2008 1215   CL 103 04/02/2008 1215   CO2 25 04/02/2008 1215   GLUCOSE 89 04/02/2008 1215   BUN 15 04/02/2008 1215   CREATININE 1.34 04/02/2008 1215   CALCIUM 9.2 04/02/2008 1215   PROT 6.9 04/02/2008 1215   ALBUMIN 4.3 04/02/2008 1215   AST 26 04/02/2008 1215   ALT 22 04/02/2008 1215   ALKPHOS 31* 04/02/2008 1215   BILITOT 0.7 04/02/2008 1215   GFRNONAA 54* 04/02/2008 1215   GFRAA  04/02/2008 1215    >60        The eGFR has been calculated using the MDRD equation. This calculation has not been validated in all clinical situations. eGFR's persistently <60 mL/min signify possible Chronic Kidney Disease.    ASSESSMENT AND PLAN  DAYVIN ABER  is a 69 y.o. male  with fatigable left ptosis, mild to moderate facial, neck, limb muscle weakness, positive acetylcholine receptor binding antibody, consistent with seropositive generalized myasthenia gravis, MRI of the brain showed no significant abnormality, CAT scan of the chest no evidence of thymoma, he has mild elevated glucose, A1c 6.1, mild abnormal kidney function, most recent creatinine 1.69    Seropositive generalized myasthenia gravis, I have discussed with patient the treatment plan, including the long-term immunosuppressive treatment choices.  1.We decided to proceed with Imuran titrating to 100 mg twice a day  2. low-dose prednisone 20 mg every day for 2 weeks, quickly tapering down to 10 mg daily, once I see improvement of his myasthenia gravis symptoms, I will tapering down or stop prednisone treatment. Continue Mestinon 60 mg 3 times a day  3.  He is planning on to have urethral reconstruction surgery by [facility] urologist Dr. .Odis Luster in June 2016, after discussed with patient, I have suggested him to hold off surgery at this point, he has evidence of mild to moderate generalized weakness, I am concern of worsening of his myasthenia gravis symptoms, perisurgical period of time, going through general anesthesia. Separate letter generated to his urologist   4. Return to clinic in 3-4 weeks, laboratory evaluation      Marcial Pacas, M.D. Ph.D.  [facility] [address] [place], [place] [address] Ph: [phone] Fax: [phone]

## 2014-06-15 LAB — HGB A1C W/O EAG: Hgb A1c MFr Bld: 6.1 % — ABNORMAL HIGH (ref 4.8–5.6)

## 2014-06-15 LAB — ACETYLCHOLINE RECEPTOR, MODULATING: Acetylcholine Modulat Ab: 31 % — ABNORMAL HIGH (ref 0–20)

## 2014-06-17 ENCOUNTER — Encounter: Payer: Self-pay | Admitting: Neurology

## 2014-06-17 ENCOUNTER — Telehealth: Payer: Self-pay | Admitting: Neurology

## 2014-06-17 NOTE — Telephone Encounter (Signed)
[place] from [place] in [employer] [phone]) called and stated that the diagnosis code they received was invalid. (Checked the code and it matches the one we have) Please call and and advise.

## 2014-06-17 NOTE — Telephone Encounter (Signed)
Spoke to [place] - said the problem has been resolved.

## 2014-06-17 NOTE — Telephone Encounter (Signed)
We did not have the patient's prescription benefit info on file.  I called the pharmacy.  Spoke with Gerald Stabs.  He provided the following info: [employer] [id number] Group: [id number] Phone [phone].  Ins has been contacted and provided with clinical info.  Request is under review Ref Key: [place]

## 2014-06-17 NOTE — Telephone Encounter (Signed)
[place] from [place] called wanting to let Sharyn Lull know that she is still having problems. Please call and advise. [place] can be reached @ [phone]

## 2014-06-17 NOTE — Telephone Encounter (Signed)
Imuran requires prior authorization. Thank you. (I have already taken care of the problem stated below).

## 2014-06-18 ENCOUNTER — Telehealth: Payer: Self-pay

## 2014-06-18 ENCOUNTER — Encounter: Payer: Self-pay | Admitting: *Deleted

## 2014-06-18 MED ORDER — AZATHIOPRINE 50 MG PO TABS: ORAL_TABLET | ORAL | Status: DC

## 2014-06-18 NOTE — Telephone Encounter (Signed)
Rx has been sent.  Total dose remains the same.  (Replied to patient via [employer])

## 2014-06-18 NOTE — Telephone Encounter (Signed)
[employer] [facility]) has approved the request for coverage on Azathioprine effective until 01/31/2015 Ref # [id number].  Copy of approval letter has been faxed to pharmacy.

## 2014-06-18 NOTE — Telephone Encounter (Signed)
[employer] [facility]) has approved the request for coverage on Azathioprine effective until 01/31/2015 Ref # [id number].  Copy of approval letter has been faxed to pharmacy.

## 2014-06-24 ENCOUNTER — Telehealth: Payer: Self-pay | Admitting: *Deleted

## 2014-06-24 NOTE — Telephone Encounter (Signed)
Labs reported on 06/05/14 by [facility] [phone]).  TSH: 1.410 uIU/ml  CBC: Normal  SED RATE: 59mm/hr

## 2014-07-07 ENCOUNTER — Ambulatory Visit (INDEPENDENT_AMBULATORY_CARE_PROVIDER_SITE_OTHER): Payer: Medicare Other | Admitting: Neurology

## 2014-07-07 ENCOUNTER — Encounter: Payer: Self-pay | Admitting: Neurology

## 2014-07-07 VITALS — BP 134/71 | HR 49 | Ht 71.5 in | Wt 210.0 lb

## 2014-07-07 DIAGNOSIS — R7309 Other abnormal glucose: Secondary | ICD-10-CM

## 2014-07-07 DIAGNOSIS — G7000 Myasthenia gravis without (acute) exacerbation: Secondary | ICD-10-CM

## 2014-07-07 NOTE — Progress Notes (Signed)
PATIENT: Eugene Castaneda DOB: May 07, 1945  HISTORICAL( initial visit May 4th 2016)  Eugene Castaneda is a 69 year old right-handed gentleman referred by his primary care physician Dr. 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,   Since April 2016 he noticed intermittent double vision, become most obvious in May 29 2014, while he was looking to the right side talking with somebody, he noticed oblique double vision, he also noticed intermittent left droopy eyelid  He denies dysarthria, no dysphagia, did not notice any limb muscle weakness,  He has a history of prostate cancer, planning on to have urethral reconstruction surgery by [facility] urologist Dr.Terlecki  UPDATE Jun 13 2014: I have reviewed her laboratory from primary care in Jun 03 2014, TSH was normal, slight elevated glucose 101, elevated creatinine 1.69, with GFR of 47, normal CBC, Acetylcholine receptor binding antibody was positive with titer of 3.71, MRI of the brain showed mild atrophy, CT chest show no evidence of thymoma  Patient has seropositive generalized myasthenia gravis, he has been taking Mestinon 60 mg 3 times a day, tolerating it well, does notice improvement of his vision, heaviness of his eyelid, he reported occasionally chewing difficulty, blurry vision looking far distance especially to the right side, no swallowing difficulty, no breathing difficulty, he exercise regularly, swimming long distance without difficulty, he did not notice any significant bleeding muscle weakness  UPDATE June 6th 2016: He started prednisone since May 18th, 3m x 2 weeks, then since June 4th prednisone 115mam,   Imuran since May 21, now taking 5068m tab bid,  tolerating it well.  He has total urinary incontinence, he drinks a lot of water, exercise regularly. He is also taking mestinon 51m67md. He only has occasional double vision, especially when looking to right.  He has no reading difficulties, mild difficulty chewing tough food, such as raw carrot, steak, he is able to do weight lifting, 95% of normal strength per patient.  He has been exercise regularly, fasting glucose level was in the range of 80-119.  REVIEW OF SYSTEMS: Full 14 system review of systems performed and notable only for as above ALLERGIES: No Known Allergies  HOME MEDICATIONS: Current Outpatient Prescriptions  Medication Sig Dispense Refill  . aspirin 81 MG tablet Take 81 mg by mouth daily.    . cholecalciferol (VITAMIN D) 1000 UNITS tablet Take 1,000 Units by mouth daily.    . fenofibrate 160 MG tablet Take 160 mg by mouth daily.    . Fish Oil OIL 1 tablet by Does not apply route daily.    . flecainide (TAMBOCOR) 50 MG tablet Take 50 mg by mouth 2 (two) times daily.    . Le[place]land Kitchenprolide Acetate (LUPRON IJ) Inject as directed every 3 (three) months.    . levothyroxine (SYNTHROID, LEVOTHROID) 175 MCG tablet Take 175 mcg by mouth daily before breakfast.    . lisinopril (PRINIVIL,ZESTRIL) 20 MG tablet Take 20 mg by mouth daily.    . Multiple Vitamin (MULTIVITAMIN) tablet Take 1 tablet by mouth daily.    . omMarland Kitchenprazole (PRILOSEC) 20 MG capsule Take 20 mg by mouth daily.    . Pomegranate, Punica granatum, 150 MG CAPS Take 1 tablet by mouth daily.       PAST MEDICAL HISTORY: Past Medical History  Diagnosis Date  . Hypothyroidism   . Hypertension   . Hyperlipemia   . Prostate cancer   . Urethral obstruction   . Atrial fibrillation   . GERD (gastroesophageal reflux disease)   . Impaired fasting glucose   . Diplopia     PAST SURGICAL HISTORY: Past Surgical History  Procedure Laterality Date  . Prostatectomy    . Catheter ablationfor afib    . Tonsillectomy    .  Colonoscopy      FAMILY HISTORY: Family History  Problem Relation Age of Onset  . Parkinson's disease Father   . Breast cancer Mother     SOCIAL HISTORY:  History   Social History  . Marital Status: Married    Spouse Name: N/A  . Number of Children: 2  . Years of Education: Masters   Occupational History  . Retired from Network engineer job    Social History Main Topics  . Smoking status: Former Smoker    Quit date: 02/01/1980  . Smokeless tobacco: Not on file  . Alcohol Use: 0.0 oz/week    0 Standard drinks or equivalent per week     Comment: 2 glasses red wine daily  . Drug Use: No  . Sexual Activity: Not on file   Other Topics Concern  . Not on file   Social History Narrative   Lives at home with spouse.   Right-handed.   2 cups caffeine daily.     PHYSICAL EXAM   Filed Vitals:   07/07/14 0850  BP: 134/71  Pulse: 49  Height: 5' 11.5" (1.816 m)  Weight: 210 lb (95.255 kg)    Not recorded      Body mass index is 28.88 kg/(m^2).  PHYSICAL EXAMNIATION:  Gen: NAD, conversant, well nourised, obese, well groomed                     Cardiovascular: Regular rate rhythm, no peripheral edema, warm, nontender. Eyes: Conjunctivae clear without exudates or hemorrhage Neck: Supple, no carotid bruise. Pulmonary: Clear to auscultation bilaterally   NEUROLOGICAL EXAM:  MENTAL STATUS: Speech:    Speech is normal; fluent and spontaneous with normal comprehension.  Cognition:    The patient is oriented to person, place, and time;     recent and remote memory intact;     language fluent;     normal attention, concentration,     fund of knowledge.  CRANIAL NERVES: CN II: Visual fields are full to confrontation. Fundoscopic exam is normal with sharp discs and no vascular changes. Venous pulsations are present bilaterally. Pupils are 4 mm and briskly reactive to light.   CN III, IV, VI: extraocular movement are normal. I did not appreciate any ptosis today, Rent less  testing, cover and uncover testing demonstrated mild bilateral exraocular muscle weakness, with mild bilateral exophoria CN V: Facial sensation is intact to pinprick in all 3 divisions bilaterally. Corneal responses are intact.  CN VII: Face is symmetric, he has mild bilateral eye closure and cheek puff weakness CN VIII: Hearing is normal to rubbing fingers CN IX, X: Palate elevates symmetrically. Phonation is normal. CN XI: Head turning and shoulder shrug are intact CN XII: Tongue is midline with normal movements and no atrophy.  MOTOR: There is no pronator drift of out-stretched arms. Muscle bulk and tone are normal. He has mild neck flexion weakness  Shoulder abduction Shoulder external rotation Elbow flexion Elbow extension Wrist flexion Wrist extension Finger abduction Hip flexion Knee flexion Knee extension  Ankle dorsi flexion Ankle plantar flexion  R  '5   5  5 5 5 5 5  5  5 5  5  5  ' L  '5   5  5 5 5 5 5  5  5  5 5 5    ' REFLEXES: Reflexes are 2+ and symmetric at the biceps, triceps, knees, and ankles. Plantar responses are flexor.  SENSORY: Light touch, pinprick, position sense, and vibration sense are intact in fingers and toes.  COORDINATION: Rapid alternating movements and fine finger movements are intact. There is no dysmetria on finger-to-nose and heel-knee-shin. There are no abnormal or extraneous movements.   GAIT/STANCE: Posture is normal. Gait is steady with normal steps, base, arm swing, and turning. Heel and toe walking are normal. Tandem gait is normal.  Romberg is absent.   DIAGNOSTIC DATA (LABS, IMAGING, TESTING) - I reviewed patient records, labs, notes, testing and imaging myself where available.  Lab Results  Component Value Date   WBC 6.2 04/02/2008   HGB 14.7 04/02/2008   HCT 42.0 04/02/2008   MCV 93.7 04/02/2008   PLT 317 04/02/2008      Component Value Date/Time   NA 137 04/02/2008 1215   K 4.4 04/02/2008 1215   CL 103 04/02/2008 1215   CO2 25  04/02/2008 1215   GLUCOSE 89 04/02/2008 1215   BUN 15 04/02/2008 1215   CREATININE 1.34 04/02/2008 1215   CALCIUM 9.2 04/02/2008 1215   PROT 6.9 04/02/2008 1215   ALBUMIN 4.3 04/02/2008 1215   AST 26 04/02/2008 1215   ALT 22 04/02/2008 1215   ALKPHOS 31* 04/02/2008 1215   BILITOT 0.7 04/02/2008 1215   GFRNONAA 54* 04/02/2008 1215   GFRAA  04/02/2008 1215    >60        The eGFR has been calculated using the MDRD equation. This calculation has not been validated in all clinical situations. eGFR's persistently <60 mL/min signify possible Chronic Kidney Disease.    ASSESSMENT AND PLAN  Eugene Castaneda is a 69 y.o. male  with fatigable left ptosis, mild to moderate facial, neck, limb muscle weakness, positive acetylcholine receptor binding antibody, consistent with seropositive generalized myasthenia gravis, MRI of the brain showed no significant abnormality, CAT scan of the chest no evidence of thymoma, he has mild elevated glucose, A1c 6.1, mild abnormal kidney function, most recent creatinine 1.69    Seropositive generalized myasthenia gravis,   1.keep Imuran 100 mg twice a day , check CMP, CBC today, 2. Tapering down prednisone to 10 mg every day, his morning fasting glucose level was in the range of 90-118 3. Continue Mestinon 60 mg half to 1 tablet 3 times a day  4.  He is planning on to have urethral reconstruction surgery by [facility] urologist Dr. .Odis Luster in September 22 2014 5. Return to clinic in 3-4 weeks, laboratory evaluation today      Marcial Pacas, M.D. Ph.D.  [facility] [address], [place], [place] [address] Ph: [phone] Fax: [phone]

## 2014-07-08 ENCOUNTER — Telehealth: Payer: Self-pay | Admitting: *Deleted

## 2014-07-08 LAB — COMPREHENSIVE METABOLIC PANEL
A/G RATIO: 2.3 (ref 1.1–2.5)
ALBUMIN: 4.4 g/dL (ref 3.6–4.8)
ALK PHOS: 41 IU/L (ref 39–117)
ALT: 40 IU/L (ref 0–44)
AST: 31 IU/L (ref 0–40)
BILIRUBIN TOTAL: 0.4 mg/dL (ref 0.0–1.2)
BUN/Creatinine Ratio: 13 (ref 10–22)
BUN: 22 mg/dL (ref 8–27)
CALCIUM: 9.7 mg/dL (ref 8.6–10.2)
CHLORIDE: 100 mmol/L (ref 97–108)
CO2: 25 mmol/L (ref 18–29)
Creatinine, Ser: 1.64 mg/dL — ABNORMAL HIGH (ref 0.76–1.27)
GFR calc non Af Amer: 42 mL/min/{1.73_m2} — ABNORMAL LOW (ref >59)
GFR, EST AFRICAN AMERICAN: 49 mL/min/{1.73_m2} — AB (ref >59)
GLOBULIN, TOTAL: 1.9 g/dL (ref 1.5–4.5)
GLUCOSE: 108 mg/dL — AB (ref 65–99)
POTASSIUM: 5.3 mmol/L — AB (ref 3.5–5.2)
Sodium: 140 mmol/L (ref 134–144)
Total Protein: 6.3 g/dL (ref 6.0–8.5)

## 2014-07-08 LAB — CBC
Hematocrit: 41.9 % (ref 37.5–51.0)
Hemoglobin: 13.3 g/dL (ref 12.6–17.7)
MCH: 31.1 pg (ref 26.6–33.0)
MCHC: 31.7 g/dL (ref 31.5–35.7)
MCV: 98 fL — ABNORMAL HIGH (ref 79–97)
Platelets: 228 10*3/uL (ref 150–379)
RBC: 4.27 x10E6/uL (ref 4.14–5.80)
RDW: 13.7 % (ref 12.3–15.4)
WBC: 8.4 10*3/uL (ref 3.4–10.8)

## 2014-07-08 NOTE — Telephone Encounter (Signed)
-----   Message from Marcial Pacas, MD sent at 07/08/2014  8:04 AM EDT ----- Please call patient, mild elevated glucose 108, elevated creatinine 1.64, slight worsening than his baseline of 1.4, advise him keep well hydration, will repeat lab next visit. CBC showed no significant abnormalities.

## 2014-07-08 NOTE — Telephone Encounter (Signed)
Patient aware of results.

## 2014-08-05 DIAGNOSIS — H6690 Otitis media, unspecified, unspecified ear: Secondary | ICD-10-CM | POA: Diagnosis not present

## 2014-08-05 DIAGNOSIS — I10 Essential (primary) hypertension: Secondary | ICD-10-CM | POA: Diagnosis not present

## 2014-08-12 DIAGNOSIS — Z125 Encounter for screening for malignant neoplasm of prostate: Secondary | ICD-10-CM | POA: Diagnosis not present

## 2014-08-12 DIAGNOSIS — C61 Malignant neoplasm of prostate: Secondary | ICD-10-CM | POA: Diagnosis not present

## 2014-08-12 DIAGNOSIS — N393 Stress incontinence (female) (male): Secondary | ICD-10-CM | POA: Diagnosis not present

## 2014-08-14 ENCOUNTER — Encounter: Payer: Self-pay | Admitting: Neurology

## 2014-08-14 ENCOUNTER — Ambulatory Visit (INDEPENDENT_AMBULATORY_CARE_PROVIDER_SITE_OTHER): Payer: Medicare Other | Admitting: Neurology

## 2014-08-14 VITALS — BP 143/67 | HR 54 | Ht 71.5 in | Wt 212.0 lb

## 2014-08-14 DIAGNOSIS — G7000 Myasthenia gravis without (acute) exacerbation: Secondary | ICD-10-CM

## 2014-08-14 DIAGNOSIS — R7309 Other abnormal glucose: Secondary | ICD-10-CM

## 2014-08-14 NOTE — Addendum Note (Signed)
Addended by: Marcial Pacas on: 08/14/2014 09:41 AM   Modules accepted: Orders

## 2014-08-14 NOTE — Progress Notes (Signed)
Chief Complaint  Patient presents with  . Myasthenia Gravis    He is still having upper extremity weakness.  This causes him some difficutly with his exercise regimen, especially when using weight bearing machines. His vision has improved.      PATIENT: Eugene Castaneda DOB: 06/24/1945  HISTORICAL( initial visit May 4th 2016)  Eugene Castaneda is a 69 year old right-handed gentleman referred by his primary care physician Dr. 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,   Since April 2016 he noticed intermittent double vision, become most obvious in May 29 2014, while he was looking to the right side talking with somebody, he noticed oblique double vision, he also noticed intermittent left droopy eyelid  He denies dysarthria, no dysphagia, did not notice any limb muscle weakness,  He has a history of prostate cancer, planning on to have urethral reconstruction surgery by [facility] urologist Dr.Terlecki  UPDATE Jun 13 2014: I have reviewed her laboratory from primary care in Jun 03 2014, TSH was normal, slight elevated glucose 101, elevated creatinine 1.69, with GFR of 47, normal CBC, Acetylcholine receptor binding antibody was positive with titer of 3.71, MRI of the brain showed mild atrophy, CT chest show no evidence of thymoma  Patient has seropositive generalized myasthenia gravis, he has been taking Mestinon 60 mg 3 times a day, tolerating it well, does notice improvement of his vision, heaviness of his eyelid, he reported occasionally chewing difficulty, blurry vision looking far distance especially to the right side, no swallowing difficulty, no breathing difficulty, he exercise regularly, swimming long distance  without difficulty, he did not notice any significant bleeding muscle weakness  UPDATE June 6th 2016: He started prednisone since May 18th, 2016, 20mg  x 2 weeks, then since June 4th prednisone 10mg  am,   Imuran since May 21,2016,  now taking 50mg  2 tab bid, tolerating it well.  He has total urinary incontinence, he drinks a lot of water, exercise regularly. He is also taking mestinon 60mg  tid. He only has occasional double vision, especially when looking to right.  He has no reading difficulties, mild difficulty chewing tough food, such as raw carrot, steak, he is able to do weight lifting, 95% of normal strength per patient.  He has been exercise regularly, fasting glucose level was in the range of 80-119.  UPDATE August 14 2014: Reviewed laboratory evaluation in June 2016 with normal CBC, worsening creatinine 1.64, mild elevation from baseline of 1.3  His vision has much improved, he noticed mild weakness in his limb, especially when he lifting or pulling weight, he can push up to 25 LB, pulling down 100 LB, no leg weakness. No chewing difficulty.  Glucose level was 100-110, he is now taking prednisone 10mg  daily, since June 4th 2016. Mestinon 30mg  tid,   He had left ear infection, require 2 rounds of amoxicillin since the end of June 2016, there was no significant weakness in his myasthenia gravis symptoms with the treatment.  REVIEW OF SYSTEMS: Full 14 system review of systems performed and notable only for as above ALLERGIES: No Known Allergies  HOME MEDICATIONS: Current Outpatient Prescriptions  Medication Sig Dispense Refill  . aspirin 81 MG tablet Take 81 mg by mouth daily.    . cholecalciferol (VITAMIN D) 1000 UNITS tablet Take 1,000 Units  by mouth daily.    . fenofibrate 160 MG tablet Take 160 mg by mouth daily.    . Fish Oil OIL 1 tablet by Does not apply route daily.    . flecainide (TAMBOCOR) 50 MG tablet Take 50 mg by mouth 2 (two) times daily.    Marland Kitchen Leuprolide Acetate (LUPRON  IJ) Inject as directed every 3 (three) months.    . levothyroxine (SYNTHROID, LEVOTHROID) 175 MCG tablet Take 175 mcg by mouth daily before breakfast.    . lisinopril (PRINIVIL,ZESTRIL) 20 MG tablet Take 20 mg by mouth daily.    . Multiple Vitamin (MULTIVITAMIN) tablet Take 1 tablet by mouth daily.    Marland Kitchen omeprazole (PRILOSEC) 20 MG capsule Take 20 mg by mouth daily.    . Pomegranate, Punica granatum, 150 MG CAPS Take 1 tablet by mouth daily.       PAST MEDICAL HISTORY: Past Medical History  Diagnosis Date  . Hypothyroidism   . Hypertension   . Hyperlipemia   . Prostate cancer   . Urethral obstruction   . Atrial fibrillation   . GERD (gastroesophageal reflux disease)   . Impaired fasting glucose   . Diplopia     PAST SURGICAL HISTORY: Past Surgical History  Procedure Laterality Date  . Prostatectomy    . Catheter ablationfor afib    . Tonsillectomy    . Colonoscopy      FAMILY HISTORY: Family History  Problem Relation Age of Onset  . Parkinson's disease Father   . Breast cancer Mother     SOCIAL HISTORY:  History   Social History  . Marital Status: Married    Spouse Name: N/A  . Number of Children: 2  . Years of Education: Masters   Occupational History  . Retired from Network engineer job    Social History Main Topics  . Smoking status: Former Smoker    Quit date: 02/01/1980  . Smokeless tobacco: Not on file  . Alcohol Use: 0.0 oz/week    0 Standard drinks or equivalent per week     Comment: 2 glasses red wine daily  . Drug Use: No  . Sexual Activity: Not on file   Other Topics Concern  . Not on file   Social History Narrative   Lives at home with spouse.   Right-handed.   2 cups caffeine daily.     PHYSICAL EXAM   Filed Vitals:   08/14/14 0856  BP: 143/67  Pulse: 54  Height: 5' 11.5" (1.816 m)  Weight: 212 lb (96.163 kg)    Not recorded      Body mass index is 29.16 kg/(m^2).  PHYSICAL EXAMNIATION:  Gen: NAD, conversant, well nourised,  obese, well groomed                     Cardiovascular: Regular rate rhythm, no peripheral edema, warm, nontender. Eyes: Conjunctivae clear without exudates or hemorrhage Neck: Supple, no carotid bruise. Pulmonary: Clear to auscultation bilaterally   NEUROLOGICAL EXAM:  MENTAL STATUS: Speech:    Speech is normal; fluent and spontaneous with normal comprehension.  Cognition:    The patient is oriented to person, place, and time;     recent and remote memory intact;     language fluent;     normal attention, concentration,     fund of knowledge.  CRANIAL NERVES: CN II: Visual fields are full to confrontation. Fundoscopic exam is normal with sharp discs and no vascular changes.  Pupils are 4 mm and  briskly reactive to light.   CN III, IV, VI: extraocular movement are normal. I did not appreciate any ptosis today, Rent less testing, cover and uncover testing demonstrated mild bilateral lateral rectus muscle weakness, with mild bilateral exophoria CN V: Facial sensation is intact to pinprick in all 3 divisions bilaterally. Corneal responses are intact.  CN VII: Face is symmetric, he has mild bilateral eye closure and cheek puff weakness CN VIII: Hearing is normal to rubbing fingers CN IX, X: Palate elevates symmetrically. Phonation is normal. CN XI: Head turning and shoulder shrug are intact CN XII: Tongue is midline with normal movements and no atrophy.  MOTOR: There is no pronator drift of out-stretched arms. Muscle bulk and tone are normal. I did not notice anything neck flexion weakness  REFLEXES: Reflexes are 2+ and symmetric at the biceps, triceps, knees, and ankles. Plantar responses are flexor.  SENSORY: Light touch, pinprick, position sense, and vibration sense are intact in fingers and toes.  COORDINATION: Rapid alternating movements and fine finger movements are intact. There is no dysmetria on finger-to-nose and heel-knee-shin. There are no abnormal or extraneous  movements.   GAIT/STANCE: Posture is normal. Gait is steady with normal steps, base, arm swing, and turning. Heel and toe walking are normal. Tandem gait is normal.  Romberg is absent.   DIAGNOSTIC DATA (LABS, IMAGING, TESTING) - I reviewed patient records, labs, notes, testing and imaging myself where available.  Lab Results  Component Value Date   WBC 8.4 07/07/2014   HGB 14.7 04/02/2008   HCT 41.9 07/07/2014   MCV 93.7 04/02/2008   PLT 317 04/02/2008      Component Value Date/Time   NA 140 07/07/2014 0950   NA 137 04/02/2008 1215   K 5.3* 07/07/2014 0950   CL 100 07/07/2014 0950   CO2 25 07/07/2014 0950   GLUCOSE 108* 07/07/2014 0950   GLUCOSE 89 04/02/2008 1215   BUN 22 07/07/2014 0950   BUN 15 04/02/2008 1215   CREATININE 1.64* 07/07/2014 0950   CALCIUM 9.7 07/07/2014 0950   PROT 6.3 07/07/2014 0950   PROT 6.9 04/02/2008 1215   ALBUMIN 4.3 04/02/2008 1215   AST 31 07/07/2014 0950   ALT 40 07/07/2014 0950   ALKPHOS 41 07/07/2014 0950   BILITOT 0.4 07/07/2014 0950   BILITOT 0.7 04/02/2008 1215   GFRNONAA 42* 07/07/2014 0950   GFRAA 49* 07/07/2014 0950    ASSESSMENT AND PLAN  THOMSON HERBERS is a 69 y.o. male  with fatigable left ptosis, mild to moderate facial, neck, limb muscle weakness, positive acetylcholine receptor binding antibody, consistent with seropositive generalized myasthenia gravis, MRI of the brain showed no significant abnormality, CAT scan of the chest no evidence of thymoma, he has mild elevated glucose, A1c 6.1, mild abnormal kidney function, most recent creatinine 1.69    Seropositive generalized myasthenia gravis,   1. keep Imuran 100 mg twice a day , check CMP, A1c today, 2. Keep prednisone to 10 mg every day, his morning fasting glucose level was in the range of 100 to110, may taper down prednisone further after his urethral reconstruction surgery in August 2016 3.  Mestinon 30 mg as needed 4. Return to clinic September    Marcial Pacas,  M.D. Ph.D.  [facility] [address] [place], [place] [address] Ph: [phone] Fax: [phone]

## 2014-08-15 LAB — COMPREHENSIVE METABOLIC PANEL
ALK PHOS: 39 IU/L (ref 39–117)
ALT: 40 IU/L (ref 0–44)
AST: 40 IU/L (ref 0–40)
Albumin/Globulin Ratio: 2.5 (ref 1.1–2.5)
Albumin: 4.5 g/dL (ref 3.6–4.8)
BUN/Creatinine Ratio: 15 (ref 10–22)
BUN: 19 mg/dL (ref 8–27)
Bilirubin Total: 0.4 mg/dL (ref 0.0–1.2)
CALCIUM: 10.9 mg/dL — AB (ref 8.6–10.2)
CHLORIDE: 101 mmol/L (ref 97–108)
CO2: 25 mmol/L (ref 18–29)
CREATININE: 1.23 mg/dL (ref 0.76–1.27)
GFR calc Af Amer: 69 mL/min/{1.73_m2} (ref >59)
GFR calc non Af Amer: 60 mL/min/{1.73_m2} (ref >59)
Globulin, Total: 1.8 g/dL (ref 1.5–4.5)
Glucose: 105 mg/dL — ABNORMAL HIGH (ref 65–99)
POTASSIUM: 5.4 mmol/L — AB (ref 3.5–5.2)
Sodium: 143 mmol/L (ref 134–144)
Total Protein: 6.3 g/dL (ref 6.0–8.5)

## 2014-08-15 LAB — HGB A1C W/O EAG: HEMOGLOBIN A1C: 6.3 % — AB (ref 4.8–5.6)

## 2014-08-18 DIAGNOSIS — N528 Other male erectile dysfunction: Secondary | ICD-10-CM | POA: Diagnosis not present

## 2014-08-18 DIAGNOSIS — N393 Stress incontinence (female) (male): Secondary | ICD-10-CM | POA: Diagnosis not present

## 2014-08-18 DIAGNOSIS — N35011 Post-traumatic bulbous urethral stricture: Secondary | ICD-10-CM | POA: Diagnosis not present

## 2014-08-18 DIAGNOSIS — C61 Malignant neoplasm of prostate: Secondary | ICD-10-CM | POA: Diagnosis not present

## 2014-09-01 HISTORY — PX: OTHER SURGICAL HISTORY: SHX169

## 2014-09-05 DIAGNOSIS — N393 Stress incontinence (female) (male): Secondary | ICD-10-CM | POA: Diagnosis not present

## 2014-09-05 DIAGNOSIS — C61 Malignant neoplasm of prostate: Secondary | ICD-10-CM | POA: Diagnosis not present

## 2014-09-05 DIAGNOSIS — Z125 Encounter for screening for malignant neoplasm of prostate: Secondary | ICD-10-CM | POA: Diagnosis not present

## 2014-09-08 DIAGNOSIS — I4510 Unspecified right bundle-branch block: Secondary | ICD-10-CM | POA: Diagnosis not present

## 2014-09-08 DIAGNOSIS — I10 Essential (primary) hypertension: Secondary | ICD-10-CM | POA: Diagnosis not present

## 2014-09-08 DIAGNOSIS — Z01810 Encounter for preprocedural cardiovascular examination: Secondary | ICD-10-CM | POA: Diagnosis not present

## 2014-09-08 DIAGNOSIS — R001 Bradycardia, unspecified: Secondary | ICD-10-CM | POA: Diagnosis not present

## 2014-09-11 DIAGNOSIS — N393 Stress incontinence (female) (male): Secondary | ICD-10-CM | POA: Diagnosis not present

## 2014-09-15 DIAGNOSIS — K219 Gastro-esophageal reflux disease without esophagitis: Secondary | ICD-10-CM | POA: Diagnosis not present

## 2014-09-15 DIAGNOSIS — G7000 Myasthenia gravis without (acute) exacerbation: Secondary | ICD-10-CM | POA: Diagnosis not present

## 2014-09-15 DIAGNOSIS — E039 Hypothyroidism, unspecified: Secondary | ICD-10-CM | POA: Diagnosis not present

## 2014-09-15 DIAGNOSIS — I129 Hypertensive chronic kidney disease with stage 1 through stage 4 chronic kidney disease, or unspecified chronic kidney disease: Secondary | ICD-10-CM | POA: Diagnosis not present

## 2014-09-15 DIAGNOSIS — E785 Hyperlipidemia, unspecified: Secondary | ICD-10-CM | POA: Diagnosis not present

## 2014-09-15 DIAGNOSIS — I4891 Unspecified atrial fibrillation: Secondary | ICD-10-CM | POA: Diagnosis not present

## 2014-09-15 DIAGNOSIS — N393 Stress incontinence (female) (male): Secondary | ICD-10-CM | POA: Diagnosis not present

## 2014-09-15 DIAGNOSIS — Z87891 Personal history of nicotine dependence: Secondary | ICD-10-CM | POA: Diagnosis not present

## 2014-09-15 DIAGNOSIS — Z7982 Long term (current) use of aspirin: Secondary | ICD-10-CM | POA: Diagnosis not present

## 2014-09-15 DIAGNOSIS — N183 Chronic kidney disease, stage 3 (moderate): Secondary | ICD-10-CM | POA: Diagnosis not present

## 2014-09-15 DIAGNOSIS — Z8546 Personal history of malignant neoplasm of prostate: Secondary | ICD-10-CM | POA: Diagnosis not present

## 2014-09-16 DIAGNOSIS — Z8546 Personal history of malignant neoplasm of prostate: Secondary | ICD-10-CM | POA: Diagnosis not present

## 2014-09-16 DIAGNOSIS — E785 Hyperlipidemia, unspecified: Secondary | ICD-10-CM | POA: Diagnosis not present

## 2014-09-16 DIAGNOSIS — N393 Stress incontinence (female) (male): Secondary | ICD-10-CM | POA: Diagnosis not present

## 2014-09-16 DIAGNOSIS — I129 Hypertensive chronic kidney disease with stage 1 through stage 4 chronic kidney disease, or unspecified chronic kidney disease: Secondary | ICD-10-CM | POA: Diagnosis not present

## 2014-09-16 DIAGNOSIS — I4891 Unspecified atrial fibrillation: Secondary | ICD-10-CM | POA: Diagnosis not present

## 2014-09-16 DIAGNOSIS — N183 Chronic kidney disease, stage 3 (moderate): Secondary | ICD-10-CM | POA: Diagnosis not present

## 2014-09-29 DIAGNOSIS — I480 Paroxysmal atrial fibrillation: Secondary | ICD-10-CM | POA: Diagnosis not present

## 2014-09-29 DIAGNOSIS — I495 Sick sinus syndrome: Secondary | ICD-10-CM | POA: Diagnosis not present

## 2014-09-29 DIAGNOSIS — E785 Hyperlipidemia, unspecified: Secondary | ICD-10-CM | POA: Diagnosis not present

## 2014-09-29 DIAGNOSIS — Z79899 Other long term (current) drug therapy: Secondary | ICD-10-CM | POA: Diagnosis not present

## 2014-09-29 DIAGNOSIS — I10 Essential (primary) hypertension: Secondary | ICD-10-CM | POA: Diagnosis not present

## 2014-09-29 DIAGNOSIS — N183 Chronic kidney disease, stage 3 (moderate): Secondary | ICD-10-CM | POA: Diagnosis not present

## 2014-10-08 ENCOUNTER — Encounter: Payer: Self-pay | Admitting: Neurology

## 2014-10-08 ENCOUNTER — Ambulatory Visit (INDEPENDENT_AMBULATORY_CARE_PROVIDER_SITE_OTHER): Payer: Medicare Other | Admitting: Neurology

## 2014-10-08 VITALS — BP 122/82 | HR 92 | Ht 71.5 in | Wt 213.0 lb

## 2014-10-08 DIAGNOSIS — G7000 Myasthenia gravis without (acute) exacerbation: Secondary | ICD-10-CM

## 2014-10-08 DIAGNOSIS — R7309 Other abnormal glucose: Secondary | ICD-10-CM

## 2014-10-08 NOTE — Progress Notes (Signed)
Chief Complaint  Patient presents with  . Myasthenia Gravis      PATIENT: Eugene Castaneda DOB: 05-Aug-1945  HISTORICAL( initial visit May 4th 2016)  Eugene Castaneda is here to follow-up for serum positive generalized myasthenia gravis. 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 Dagmar Hait in May 2016 for evaluation of double vision, he also has intermittent left ptosis, at its worst, he also has mild bulbar, neck flexion weakness, mild chewing difficulty, mild proximal upper extremity weakness. He never had significant swallowing, breathing difficulty, no gait difficulty  CT chest showed no evidence of thymoma May 2016 MRI of the brain in May 2016 showed mild atrophy His symptoms responded very well to Mestinon 60 mg 3 times a day, no significant side effect Prednisone was started since May 2016, up to 40 mg daily, he responded well, was able to tolerate tapering dose of prednisone  Imuran was started in May 21st 2016, 50 mg 2 tablets twice a day  He has urinary incontinence, planning on to have urethral reconstruction surgery by [facility] urologist Dr.Terlecki  Laboratory evaluation in June 2016 with normal CBC, worsening creatinine 1.64, mild elevation from baseline of 1.3, he has mild abnormal glucose, A1c was 6.1, Accu-Chek glucose level was 100-110 while he was treated with prednisone   UPDATE Sep 7th 2016: Chief Complaint  Patient presents with  . Myasthenia Gravis    Feels he is doing well overall.  Still has some difficulty with diplopia.  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  He has his first  post-surgery appointment on 10/10/14.  His physician plans to activate his device six weeks from his surgery date.  . Hyperglycemia    His glucose average for the last month has been 113.   He only has mild occasionally double vision especially when looking to the far right side, no chewing difficulty, no jaw fatigue, He is on Imuran 100 mg twice a day, prednisone 10 mg daily, mestinon 30mg  tid glucose level was mildly high average 113 in August, 2016  I reviewed Eugene Castaneda record, laboratory September 15 2014, creatinine 1.22, glucose 122   REVIEW OF SYSTEMS: Full 14 system review of systems performed and notable only for as above ALLERGIES: No Known Allergies  HOME MEDICATIONS: Current Outpatient Prescriptions  Medication Sig Dispense Refill  . aspirin 81 MG tablet Take 81 mg by mouth daily.    . cholecalciferol (VITAMIN D) 1000 UNITS tablet Take 1,000 Units by mouth daily.    . fenofibrate 160 MG tablet Take 160 mg by mouth daily.    . Fish Oil OIL 1 tablet by Does not apply route daily.    . flecainide (TAMBOCOR) 50 MG tablet Take 50 mg by mouth 2 (two) times daily.    Marland Kitchen Leuprolide Acetate (LUPRON IJ) Inject as directed every 3 (three) months.    . levothyroxine (SYNTHROID, LEVOTHROID) 175 MCG tablet Take 175 mcg by mouth daily before breakfast.    . lisinopril (PRINIVIL,ZESTRIL) 20 MG tablet Take 20 mg by mouth daily.    . Multiple Vitamin (MULTIVITAMIN) tablet Take 1 tablet by mouth daily.    Marland Kitchen omeprazole (PRILOSEC) 20 MG capsule Take 20 mg  by mouth daily.    . Pomegranate, Punica granatum, 150 MG CAPS Take 1 tablet by mouth daily.       PAST MEDICAL HISTORY: Past Medical History  Diagnosis Date  . Hypothyroidism   . Hypertension   . Hyperlipemia   . Prostate cancer   . Urethral obstruction   . Atrial fibrillation   . GERD (gastroesophageal reflux disease)   . Impaired fasting glucose   . Diplopia     PAST SURGICAL HISTORY: Past Surgical History  Procedure Laterality Date   . Prostatectomy    . Catheter ablationfor afib    . Tonsillectomy    . Colonoscopy      FAMILY HISTORY: Family History  Problem Relation Age of Onset  . Parkinson's disease Father   . Breast cancer Mother     SOCIAL HISTORY:  History   Social History  . Marital Status: Married    Spouse Name: N/A  . Number of Children: 2  . Years of Education: Masters   Occupational History  . Retired from Network engineer job    Social History Main Topics  . Smoking status: Former Smoker    Quit date: 02/01/1980  . Smokeless tobacco: Not on file  . Alcohol Use: 0.0 oz/week    0 Standard drinks or equivalent per week     Comment: 2 glasses red wine daily  . Drug Use: No  . Sexual Activity: Not on file   Other Topics Concern  . Not on file   Social History Narrative   Lives at home with spouse.   Right-handed.   2 cups caffeine daily.     PHYSICAL EXAM   Filed Vitals:   10/08/14 0822  BP: 122/82  Pulse: 92  Height: 5' 11.5" (1.816 m)  Weight: 213 lb (96.616 kg)    Not recorded      Body mass index is 29.3 kg/(m^2).  PHYSICAL EXAMNIATION:  Gen: NAD, conversant, well nourised, obese, well groomed                     Cardiovascular: Regular rate rhythm, no peripheral edema, warm, nontender. Eyes: Conjunctivae clear without exudates or hemorrhage Neck: Supple, no carotid bruise. Pulmonary: Clear to auscultation bilaterally   NEUROLOGICAL EXAM:  MENTAL STATUS: Speech:    Speech is normal; fluent and spontaneous with normal comprehension.  Cognition:    The patient is oriented to person, place, and time;     recent and remote memory intact;     language fluent;     normal attention, concentration,     fund of knowledge.  CRANIAL NERVES: CN II: Visual fields are full to confrontation. Pupils are 4 mm and briskly reactive to light.   CN III, IV, VI: extraocular movement are normal. I did not appreciate any ptosis today, Rent less testing, cover and uncover testing  demonstrated mild bilateral medial rectus muscle weakness, with mild bilateral exophoria CN V: Facial sensation is intact to pinprick in all 3 divisions bilaterally. Corneal responses are intact.  CN VII: Face is symmetric, he has mild bilateral eye closure and cheek puff weakness CN VIII: Hearing is normal to rubbing fingers CN IX, X: Palate elevates symmetrically. Phonation is normal. CN XI: Head turning and shoulder shrug are intact CN XII: Tongue is midline with normal movements and no atrophy.  MOTOR: There is no pronator drift of out-stretched arms. Muscle bulk and tone are normal. I did not notice anything neck flexion weakness  REFLEXES: Reflexes  are 2+ and symmetric at the biceps, triceps, knees, and ankles. Plantar responses are flexor.  SENSORY: Light touch, pinprick, position sense, and vibration sense are intact in fingers and toes.  COORDINATION: Rapid alternating movements and fine finger movements are intact. There is no dysmetria on finger-to-nose and heel-knee-shin. There are no abnormal or extraneous movements.   GAIT/STANCE: Posture is normal. Gait is steady with normal steps, base, arm swing, and turning. Heel and toe walking are normal. Tandem gait is normal.  Romberg is absent.   DIAGNOSTIC DATA (LABS, IMAGING, TESTING) - I reviewed patient records, labs, notes, testing and imaging myself where available.  Lab Results  Component Value Date   WBC 8.4 07/07/2014   HGB 14.7 04/02/2008   HCT 41.9 07/07/2014   MCV 93.7 04/02/2008   PLT 317 04/02/2008      Component Value Date/Time   NA 143 08/14/2014 1004   NA 137 04/02/2008 1215   K 5.4* 08/14/2014 1004   CL 101 08/14/2014 1004   CO2 25 08/14/2014 1004   GLUCOSE 105* 08/14/2014 1004   GLUCOSE 89 04/02/2008 1215   BUN 19 08/14/2014 1004   BUN 15 04/02/2008 1215   CREATININE 1.23 08/14/2014 1004   CALCIUM 10.9* 08/14/2014 1004   PROT 6.3 08/14/2014 1004   PROT 6.9 04/02/2008 1215   ALBUMIN 4.3  04/02/2008 1215   AST 40 08/14/2014 1004   ALT 40 08/14/2014 1004   ALKPHOS 39 08/14/2014 1004   BILITOT 0.4 08/14/2014 1004   BILITOT 0.7 04/02/2008 1215   GFRNONAA 60 08/14/2014 1004   GFRAA 69 08/14/2014 1004    ASSESSMENT AND PLAN  Eugene Castaneda is a 69 y.o. male    Seropositive generalized myasthenia gravis,   keep Imuran 100 mg twice a day ,  Tapering down prednisone at from 10mg  to off in 4 weeks.  Mestinon 30 mg as needed   Return to clinic in 3 months  Marcial Pacas, M.D. Ph.D.  [facility] [address] [place], [place] [address] Ph: [phone] Fax: [phone]

## 2014-10-08 NOTE — Patient Instructions (Signed)
Tapering down prednisone   Prednisone 10 mg tablet half tablet every day for 2 weeks, half tablet every other day for 2 week  then stop  Take Mestinon 1/2 to one tab as needed,

## 2014-10-18 DIAGNOSIS — Z23 Encounter for immunization: Secondary | ICD-10-CM | POA: Diagnosis not present

## 2014-10-31 DIAGNOSIS — N35011 Post-traumatic bulbous urethral stricture: Secondary | ICD-10-CM | POA: Diagnosis not present

## 2014-10-31 DIAGNOSIS — N393 Stress incontinence (female) (male): Secondary | ICD-10-CM | POA: Diagnosis not present

## 2014-11-11 ENCOUNTER — Encounter: Payer: Self-pay | Admitting: Neurology

## 2014-11-11 ENCOUNTER — Other Ambulatory Visit: Payer: Self-pay | Admitting: Neurology

## 2014-11-11 DIAGNOSIS — C61 Malignant neoplasm of prostate: Secondary | ICD-10-CM | POA: Diagnosis not present

## 2014-11-11 DIAGNOSIS — R7301 Impaired fasting glucose: Secondary | ICD-10-CM | POA: Diagnosis not present

## 2014-11-11 DIAGNOSIS — E785 Hyperlipidemia, unspecified: Secondary | ICD-10-CM | POA: Diagnosis not present

## 2014-11-11 DIAGNOSIS — Z6829 Body mass index (BMI) 29.0-29.9, adult: Secondary | ICD-10-CM | POA: Diagnosis not present

## 2014-11-11 DIAGNOSIS — I10 Essential (primary) hypertension: Secondary | ICD-10-CM | POA: Diagnosis not present

## 2014-11-11 DIAGNOSIS — H532 Diplopia: Secondary | ICD-10-CM | POA: Diagnosis not present

## 2014-11-11 DIAGNOSIS — N183 Chronic kidney disease, stage 3 (moderate): Secondary | ICD-10-CM | POA: Diagnosis not present

## 2014-11-11 DIAGNOSIS — I480 Paroxysmal atrial fibrillation: Secondary | ICD-10-CM | POA: Diagnosis not present

## 2014-11-12 MED ORDER — GLUCOSE BLOOD VI STRP: ORAL_STRIP | Status: DC

## 2014-11-17 DIAGNOSIS — C61 Malignant neoplasm of prostate: Secondary | ICD-10-CM | POA: Diagnosis not present

## 2015-01-07 ENCOUNTER — Ambulatory Visit (INDEPENDENT_AMBULATORY_CARE_PROVIDER_SITE_OTHER): Payer: Medicare Other | Admitting: Neurology

## 2015-01-07 ENCOUNTER — Encounter: Payer: Self-pay | Admitting: Neurology

## 2015-01-07 VITALS — BP 155/76 | HR 66 | Ht 71.5 in | Wt 213.0 lb

## 2015-01-07 DIAGNOSIS — R202 Paresthesia of skin: Secondary | ICD-10-CM | POA: Diagnosis not present

## 2015-01-07 DIAGNOSIS — G7000 Myasthenia gravis without (acute) exacerbation: Secondary | ICD-10-CM | POA: Diagnosis not present

## 2015-01-07 MED ORDER — AZATHIOPRINE 50 MG PO TABS: 100.0000 mg | ORAL_TABLET | Freq: Two times a day (BID) | ORAL | Status: DC

## 2015-01-07 MED ORDER — GABAPENTIN 100 MG PO CAPS: 300.0000 mg | ORAL_CAPSULE | Freq: Three times a day (TID) | ORAL | Status: DC

## 2015-01-07 NOTE — Progress Notes (Signed)
Chief Complaint  Patient presents with  . Myasthenia Gravis    Says he is doing well overall.  He does have some bilateral joint pain in his knees.  He has not used his Mestinon since his last visit.  . Abnormal blood glucose    His fasting readings have been between 100-110.  His A1C was 6.0 on 11/11/14.      PATIENT: Eugene Castaneda DOB: [birth year]  HISTORICAL( initial visit May 4th 2016)  Eugene Castaneda is here to follow-up for serum positive generalized myasthenia gravis. 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 Dagmar Hait in May 2016 for evaluation of double vision, he also has intermittent left ptosis, at its worst, he also has mild bulbar, neck flexion weakness, mild chewing difficulty, mild proximal upper extremity weakness. He never had significant swallowing, breathing difficulty, no gait difficulty  CT chest showed no evidence of thymoma May 2016 MRI of the brain in May 2016 showed mild atrophy His symptoms responded very well to Mestinon 60 mg 3 times a day, no significant side effect Prednisone was started since May 2016, up to 40 mg daily, he responded well, was able to tolerate tapering dose of prednisone  Imuran was started in May 21st 2016, 50 mg 2 tablets twice a day  He has urinary incontinence, planning on to have urethral reconstruction surgery by [facility] urologist Dr.Terlecki  Laboratory evaluation in June 2016 with normal CBC, worsening creatinine 1.64, mild elevation from baseline of 1.3, he has mild abnormal glucose, A1c was 6.1, Accu-Chek glucose level was 100-110 while he was treated with  prednisone  UPDATE Sep 7th 2016: He only has mild occasionally double vision especially when looking to the far right side, no chewing difficulty, no jaw fatigue, He is on Imuran 100 mg twice a day, prednisone 10 mg daily, mestinon 30mg  tid glucose level was mildly high average 113 in August, 2016  I reviewed [facility] record, laboratory September 15 2014, creatinine 1.22, glucose 122.   UPDATE Jan 07 2015: He had urethroplasty in 10/2013. Artificial urethral sphincter placement in August 15th 2016, which works well for him, he denies bowel and bladder incontinence,  He has stopped prednisone since October fourth 2016, there was no worsening of his symptoms, he denies double vision, no longer require Mestinon,  He exercise regularly, but since end of September 2016, he noticed penis area paresthesia, burning sensation around the rim of the gland, sometimes the tip, getting worse with movement, sometimes woke up during sleep with stabbing pain, burning sensation  He denies low back pain, no gait difficulties  REVIEW OF SYSTEMS: Full 14 system review of systems performed and notable only for as above ALLERGIES: No Known Allergies  HOME MEDICATIONS: Current Outpatient Prescriptions  Medication Sig Dispense Refill  . aspirin 81 MG tablet Take 81 mg by mouth daily.    . cholecalciferol (VITAMIN D) 1000 UNITS tablet Take 1,000 Units by mouth daily.    . fenofibrate 160 MG tablet Take 160 mg by mouth daily.    . Fish Oil OIL 1 tablet by Does not apply route daily.    . flecainide (TAMBOCOR) 50 MG tablet Take 50 mg by mouth 2 (  two) times daily.    Marland Kitchen Leuprolide Acetate (LUPRON IJ) Inject as directed every 3 (three) months.    . levothyroxine (SYNTHROID, LEVOTHROID) 175 MCG tablet Take 175 mcg by mouth daily before breakfast.    . lisinopril (PRINIVIL,ZESTRIL) 20 MG tablet Take 20 mg by mouth daily.    . Multiple Vitamin (MULTIVITAMIN) tablet Take 1 tablet by mouth daily.    Marland Kitchen omeprazole (PRILOSEC)  20 MG capsule Take 20 mg by mouth daily.    . Pomegranate, Punica granatum, 150 MG CAPS Take 1 tablet by mouth daily.       PAST MEDICAL HISTORY: Past Medical History  Diagnosis Date  . Hypothyroidism   . Hypertension   . Hyperlipemia   . Prostate cancer ([place])   . Urethral obstruction   . Atrial fibrillation ([place])   . GERD (gastroesophageal reflux disease)   . Impaired fasting glucose   . Diplopia     PAST SURGICAL HISTORY: Past Surgical History  Procedure Laterality Date  . Prostatectomy    . Catheter ablationfor afib    . Tonsillectomy    . Colonoscopy    . Artificial urinary sphincter      FAMILY HISTORY: Family History  Problem Relation Age of Onset  . Parkinson's disease Father   . Breast cancer Mother     SOCIAL HISTORY:  History   Social History  . Marital Status: Married    Spouse Name: N/A  . Number of Children: 2  . Years of Education: Masters   Occupational History  . Retired from Network engineer job    Social History Main Topics  . Smoking status: Former Smoker    Quit date: 02/01/1980  . Smokeless tobacco: Not on file  . Alcohol Use: 0.0 oz/week    0 Standard drinks or equivalent per week     Comment: 2 glasses red wine daily  . Drug Use: No  . Sexual Activity: Not on file   Other Topics Concern  . Not on file   Social History Narrative   Lives at home with spouse.   Right-handed.   2 cups caffeine daily.     PHYSICAL EXAM   Filed Vitals:   01/07/15 0830  BP: 155/76  Pulse: 66  Height: 5' 11.5" (1.816 m)  Weight: 213 lb (96.616 kg)    Not recorded      Body mass index is 29.3 kg/(m^2).  PHYSICAL EXAMNIATION:  Gen: NAD, conversant, well nourised, obese, well groomed                     Cardiovascular: Regular rate rhythm, no peripheral edema, warm, nontender. Eyes: Conjunctivae clear without exudates or hemorrhage Neck: Supple, no carotid bruise. Pulmonary: Clear to auscultation bilaterally   NEUROLOGICAL EXAM:  MENTAL  STATUS: Speech:    Speech is normal; fluent and spontaneous with normal comprehension.  Cognition:    The patient is oriented to person, place, and time;     recent and remote memory intact;     language fluent;     normal attention, concentration,     fund of knowledge.  CRANIAL NERVES: CN II: Visual fields are full to confrontation. Pupils are 4 mm and briskly reactive to light.   CN III, IV, VI: extraocular movement are normal. I did not appreciate any ptosis today, Rent less testing, cover and uncover testing demonstrated mild bilateral medial rectus muscle weakness, with mild bilateral exophoria CN V: Facial sensation is intact to pinprick in all 3  divisions bilaterally. Corneal responses are intact.  CN VII: Face is symmetric, he has mild bilateral eye closure and cheek puff weakness CN VIII: Hearing is normal to rubbing fingers CN IX, X: Palate elevates symmetrically. Phonation is normal. CN XI: Head turning and shoulder shrug are intact CN XII: Tongue is midline with normal movements and no atrophy.  MOTOR: There is no pronator drift of out-stretched arms. Muscle bulk and tone are normal. I did not notice anything neck flexion weakness  REFLEXES: Reflexes are 2+ and symmetric at the biceps, triceps, knees, and ankles. Plantar responses are flexor.  SENSORY: Light touch, pinprick, position sense, and vibration sense are intact in fingers and toes.  COORDINATION: Rapid alternating movements and fine finger movements are intact. There is no dysmetria on finger-to-nose and heel-knee-shin. There are no abnormal or extraneous movements.   GAIT/STANCE: Posture is normal. Gait is steady with normal steps, base, arm swing, and turning. Heel and toe walking are normal. Tandem gait is normal.  Romberg is absent.   DIAGNOSTIC DATA (LABS, IMAGING, TESTING) - I reviewed patient records, labs, notes, testing and imaging myself where available.  ASSESSMENT AND PLAN  OLLIS WEMHOFF is a 69 y.o. male    Seropositive generalized myasthenia gravis,   keep Imuran 50 mg 2 tablets twice a day ,  He only has mild eye-closure cheek puff, mild proximal upper extremity weakness  Prednisone was tapered off since October 2016, I was no recurrent weakness  Abnormal glucose  Average glucose level was around 110, A1c was 6.0 improved after prednisone was tapered of  Penis area paresthesia  He had history of prostatectomy, urethroplasty October 2015, urethral artificial sphincter placement September 15 2014,  Will try gabapentin 100 mg bid titrating to 300 mg 3 times a day   Return to clinic in 3 months  Marcial Pacas, M.D. Ph.D.  [facility] [address] [place], [place] [address] Ph: [phone] Fax: [phone]

## 2015-01-09 ENCOUNTER — Encounter: Payer: Self-pay | Admitting: Neurology

## 2015-01-09 DIAGNOSIS — H25013 Cortical age-related cataract, bilateral: Secondary | ICD-10-CM | POA: Diagnosis not present

## 2015-01-12 DIAGNOSIS — Z6829 Body mass index (BMI) 29.0-29.9, adult: Secondary | ICD-10-CM | POA: Diagnosis not present

## 2015-01-12 DIAGNOSIS — N489 Disorder of penis, unspecified: Secondary | ICD-10-CM | POA: Diagnosis not present

## 2015-01-18 ENCOUNTER — Other Ambulatory Visit: Payer: Self-pay | Admitting: Neurology

## 2015-01-20 DIAGNOSIS — N35013 Post-traumatic anterior urethral stricture: Secondary | ICD-10-CM | POA: Diagnosis not present

## 2015-01-20 DIAGNOSIS — N393 Stress incontinence (female) (male): Secondary | ICD-10-CM | POA: Diagnosis not present

## 2015-01-20 DIAGNOSIS — N359 Urethral stricture, unspecified: Secondary | ICD-10-CM | POA: Diagnosis not present

## 2015-01-20 DIAGNOSIS — N4889 Other specified disorders of penis: Secondary | ICD-10-CM | POA: Diagnosis not present

## 2015-02-17 DIAGNOSIS — C61 Malignant neoplasm of prostate: Secondary | ICD-10-CM | POA: Diagnosis not present

## 2015-02-17 DIAGNOSIS — R309 Painful micturition, unspecified: Secondary | ICD-10-CM | POA: Diagnosis not present

## 2015-02-19 DIAGNOSIS — N393 Stress incontinence (female) (male): Secondary | ICD-10-CM | POA: Diagnosis not present

## 2015-02-19 DIAGNOSIS — N4889 Other specified disorders of penis: Secondary | ICD-10-CM | POA: Diagnosis not present

## 2015-02-19 DIAGNOSIS — N35011 Post-traumatic bulbous urethral stricture: Secondary | ICD-10-CM | POA: Diagnosis not present

## 2015-02-23 DIAGNOSIS — N5231 Erectile dysfunction following radical prostatectomy: Secondary | ICD-10-CM | POA: Diagnosis not present

## 2015-02-23 DIAGNOSIS — Z8546 Personal history of malignant neoplasm of prostate: Secondary | ICD-10-CM | POA: Diagnosis not present

## 2015-02-23 DIAGNOSIS — N4889 Other specified disorders of penis: Secondary | ICD-10-CM | POA: Diagnosis not present

## 2015-03-27 DIAGNOSIS — E785 Hyperlipidemia, unspecified: Secondary | ICD-10-CM | POA: Diagnosis not present

## 2015-03-27 DIAGNOSIS — T83111A Breakdown (mechanical) of urinary sphincter implant, initial encounter: Secondary | ICD-10-CM | POA: Diagnosis not present

## 2015-03-27 DIAGNOSIS — N189 Chronic kidney disease, unspecified: Secondary | ICD-10-CM | POA: Diagnosis not present

## 2015-03-27 DIAGNOSIS — Z87891 Personal history of nicotine dependence: Secondary | ICD-10-CM | POA: Diagnosis not present

## 2015-03-27 DIAGNOSIS — N393 Stress incontinence (female) (male): Secondary | ICD-10-CM | POA: Diagnosis not present

## 2015-03-27 DIAGNOSIS — I129 Hypertensive chronic kidney disease with stage 1 through stage 4 chronic kidney disease, or unspecified chronic kidney disease: Secondary | ICD-10-CM | POA: Diagnosis not present

## 2015-03-27 DIAGNOSIS — N4889 Other specified disorders of penis: Secondary | ICD-10-CM | POA: Diagnosis not present

## 2015-03-27 DIAGNOSIS — R339 Retention of urine, unspecified: Secondary | ICD-10-CM | POA: Diagnosis not present

## 2015-03-27 DIAGNOSIS — E039 Hypothyroidism, unspecified: Secondary | ICD-10-CM | POA: Diagnosis not present

## 2015-03-27 DIAGNOSIS — N35013 Post-traumatic anterior urethral stricture: Secondary | ICD-10-CM | POA: Diagnosis not present

## 2015-03-27 DIAGNOSIS — I4891 Unspecified atrial fibrillation: Secondary | ICD-10-CM | POA: Diagnosis not present

## 2015-03-27 DIAGNOSIS — Z8546 Personal history of malignant neoplasm of prostate: Secondary | ICD-10-CM | POA: Diagnosis not present

## 2015-03-27 DIAGNOSIS — K219 Gastro-esophageal reflux disease without esophagitis: Secondary | ICD-10-CM | POA: Diagnosis not present

## 2015-03-30 DIAGNOSIS — N183 Chronic kidney disease, stage 3 (moderate): Secondary | ICD-10-CM | POA: Diagnosis not present

## 2015-03-30 DIAGNOSIS — I480 Paroxysmal atrial fibrillation: Secondary | ICD-10-CM | POA: Diagnosis not present

## 2015-04-03 DIAGNOSIS — N4889 Other specified disorders of penis: Secondary | ICD-10-CM | POA: Diagnosis not present

## 2015-04-03 DIAGNOSIS — N393 Stress incontinence (female) (male): Secondary | ICD-10-CM | POA: Diagnosis not present

## 2015-04-03 DIAGNOSIS — N35013 Post-traumatic anterior urethral stricture: Secondary | ICD-10-CM | POA: Diagnosis not present

## 2015-04-07 ENCOUNTER — Encounter: Payer: Self-pay | Admitting: Neurology

## 2015-04-07 ENCOUNTER — Ambulatory Visit (INDEPENDENT_AMBULATORY_CARE_PROVIDER_SITE_OTHER): Payer: Medicare Other | Admitting: Neurology

## 2015-04-07 VITALS — BP 108/57 | HR 66 | Ht 71.5 in | Wt 206.0 lb

## 2015-04-07 DIAGNOSIS — R202 Paresthesia of skin: Secondary | ICD-10-CM

## 2015-04-07 DIAGNOSIS — R7309 Other abnormal glucose: Secondary | ICD-10-CM | POA: Insufficient documentation

## 2015-04-07 DIAGNOSIS — G7000 Myasthenia gravis without (acute) exacerbation: Secondary | ICD-10-CM | POA: Diagnosis not present

## 2015-04-07 MED ORDER — AZATHIOPRINE 100 MG PO TABS: 100.0000 mg | ORAL_TABLET | Freq: Two times a day (BID) | ORAL | Status: DC

## 2015-04-07 NOTE — Progress Notes (Signed)
Chief Complaint  Patient presents with  . Myasthenia Gravis    No new concerns.  . Urinary Retention    He had to have a suprapubic catheter placed on 04/25/15.  He is undergoing multiple tests with urology.  . Impaired Glucose    His average glucose readings have been around 106.  However, since having the catheter placed, his average glucose has dropped to 85-90.      PATIENT: Eugene Castaneda DOB: 07/06/1945  HISTORICAL( initial visit May 4th 2016)  Eugene Castaneda is here to follow-up for serum positive generalized myasthenia gravis. 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 Dagmar Hait in May 2016 for evaluation of double vision, he also has intermittent left ptosis, at its worst, he also has mild bulbar, neck flexion weakness, mild chewing difficulty, mild proximal upper extremity weakness. He never had significant swallowing, breathing difficulty, no gait difficulty  CT chest showed no evidence of thymoma May 2016 MRI of the brain in May 2016 showed mild atrophy His symptoms responded very well to Mestinon 60 mg 3 times a day, no significant side effect Prednisone was started since May 2016, up to 40 mg daily, he responded well, was able to tolerate tapering dose of prednisone  Imuran was started in May 21st 2016, 50 mg 2 tablets twice a day  He has urinary incontinence, planning on to have urethral reconstruction surgery by [facility] urologist Dr.Terlecki  Laboratory evaluation in June 2016 with normal CBC, worsening creatinine 1.64, mild elevation from baseline of 1.3, he has mild abnormal glucose, A1c was 6.1, Accu-Chek  glucose level was 100-110 while he was treated with prednisone  UPDATE Sep 7th 2016: He only has mild occasionally double vision especially when looking to the far right side, no chewing difficulty, no jaw fatigue, He is on Imuran 100 mg twice a day, prednisone 10 mg daily, mestinon 30mg  tid glucose level was mildly high average 113 in August, 2016  I reviewed [name], laboratory September 15 2014, creatinine 1.22, glucose 122.   UPDATE Jan 07 2015: He had urethroplasty in 10/2013. Artificial urethral sphincter placement in August 15th 2016, which works well for him, he denies bowel and bladder incontinence,  He has stopped prednisone since October fourth 2016, there was no worsening of his symptoms, he denies double vision, no longer require Mestinon,  He exercise regularly, but since end of September 2016, he noticed penis area paresthesia, burning sensation around the rim of the gland, sometimes the tip, getting worse with movement, sometimes woke up during sleep with stabbing pain, burning sensation  He denies low back pain, no gait difficulties  UPDATE April 07 2015: He had artificail urithoersphinctuer in Oct 2017, gabapentin did hot help his pelvic area pain, in March 2017, he developed urinary retention, was treated with antibiotic, eventually had suprapubic catheter, recent evaluation of cystoscope showed inflamed urethra tissues  He has been stable from myasthenia gravis standpoint, no double vision, no ptosis, no limb muscle weakness, he was not able to exercise regularly  REVIEW OF SYSTEMS: Full 14 system review of systems performed and notable only for as above ALLERGIES: No Known Allergies  HOME MEDICATIONS: Current Outpatient Prescriptions  Medication Sig Dispense Refill  . aspirin 81 MG tablet Take 81 mg by mouth daily.    . cholecalciferol (VITAMIN D) 1000 UNITS tablet Take 1,000 Units by mouth daily.    . fenofibrate 160 MG tablet Take 160 mg by mouth daily.    .  Fish Oil OIL 1 tablet by Does not apply route daily.    . flecainide (TAMBOCOR) 50 MG tablet Take 50 mg by mouth 2 (two) times daily.    Marland Kitchen Leuprolide Acetate (LUPRON IJ) Inject as directed every 3 (three) months.    . levothyroxine (SYNTHROID, LEVOTHROID) 175 MCG tablet Take 175 mcg by mouth daily before breakfast.    . lisinopril (PRINIVIL,ZESTRIL) 20 MG tablet Take 20 mg by mouth daily.    . Multiple Vitamin (MULTIVITAMIN) tablet Take 1 tablet by mouth daily.    Marland Kitchen omeprazole (PRILOSEC) 20 MG capsule Take 20 mg by mouth daily.    . Pomegranate, Punica granatum, 150 MG CAPS Take 1 tablet by mouth daily.       PAST MEDICAL HISTORY: Past Medical History  Diagnosis Date  . Hypothyroidism   . Hypertension   . Hyperlipemia   . Prostate cancer ([place])   . Urethral obstruction   . Atrial fibrillation ([place])   . GERD (gastroesophageal reflux disease)   . Impaired fasting glucose   . Diplopia   . Urinary retention     Suprapubic Catheter placed 04/25/15    PAST SURGICAL HISTORY: Past Surgical History  Procedure Laterality Date  . Prostatectomy    . Catheter ablationfor afib    . Tonsillectomy    . Colonoscopy    . Artificial urinary sphincter      FAMILY HISTORY: Family History  Problem Relation Age of Onset  . Parkinson's disease Father   . Breast cancer Mother     SOCIAL HISTORY:  History   Social History  . Marital Status: Married    Spouse Name: N/A  . Number of Children: 2  . Years of Education: Masters   Occupational History  . Retired from Network engineer job    Social History Main Topics  . Smoking status: Former Smoker    Quit date: 02/01/1980  . Smokeless tobacco: Not on file  . Alcohol Use: 0.0 oz/week    0 Standard drinks or equivalent per week     Comment: 2 glasses red wine daily  . Drug Use: No  . Sexual Activity: Not on file   Other Topics Concern  . Not on file   Social History Narrative   Lives at home with spouse.   Right-handed.   2 cups caffeine  daily.     PHYSICAL EXAM   Filed Vitals:   04/07/15 0838  BP: 108/57  Pulse: 66  Height: 5' 11.5" (1.816 m)  Weight: 206 lb (93.441 kg)    Not recorded      Body mass index is 28.33 kg/(m^2).  PHYSICAL EXAMNIATION:  Gen: NAD, conversant, well nourised, obese, well groomed                     Cardiovascular: Regular rate rhythm, no peripheral edema, warm, nontender. Eyes: Conjunctivae clear without exudates or hemorrhage Neck: Supple, no carotid bruise. Pulmonary: Clear to auscultation bilaterally   NEUROLOGICAL EXAM:  MENTAL STATUS: Speech:    Speech is normal; fluent and spontaneous with normal comprehension.  Cognition:    The patient is oriented to person, place, and time;     recent and remote  memory intact;     language fluent;     normal attention, concentration,     fund of knowledge.  CRANIAL NERVES: CN II: Visual fields are full to confrontation. Pupils are 4 mm and briskly reactive to light.   CN III, IV, VI: extraocular movement are normal. I did not appreciate any ptosis today, Rent less testing, cover and uncover testing demonstrated mild bilateral medial rectus muscle weakness, with mild bilateral exophoria CN V: Facial sensation is intact to pinprick in all 3 divisions bilaterally. Corneal responses are intact.  CN VII: Face is symmetric, he has mild bilateral eye closure and cheek puff weakness CN VIII: Hearing is normal to rubbing fingers CN IX, X: Palate elevates symmetrically. Phonation is normal. CN XI: Head turning and shoulder shrug are intact CN XII: Tongue is midline with normal movements and no atrophy.  MOTOR: There is no pronator drift of out-stretched arms. Muscle bulk and tone are normal. I did not notice anything neck flexion weakness  REFLEXES: Reflexes are 2+ and symmetric at the biceps, triceps, knees, and ankles. Plantar responses are flexor.  SENSORY: Light touch, pinprick, position sense, and vibration sense are intact in  fingers and toes.  COORDINATION: Rapid alternating movements and fine finger movements are intact. There is no dysmetria on finger-to-nose and heel-knee-shin. There are no abnormal or extraneous movements.   GAIT/STANCE: Posture is normal. Gait is steady with normal steps, base, arm swing, and turning. Heel and toe walking are normal. Tandem gait is normal.  Romberg is absent.   DIAGNOSTIC DATA (LABS, IMAGING, TESTING) - I reviewed patient records, labs, notes, testing and imaging myself where available.  ASSESSMENT AND PLAN  JAMEZ LEVEE is a 70 y.o. male    Seropositive generalized myasthenia gravis,   keep Imuran 50 mg 2 tablets twice a day ,  He only has mild eye-closure cheek puff, mild proximal upper extremity weakness  Prednisone was tapered off since October 2016, I was no recurrent weakness  Laboratory evaluation today  Abnormal glucose  improved after prednisone was tapered of prednisone   Return to clinic in 3 months  Marcial Pacas, M.D. Ph.D.  [facility] [address] [place], [place] [address] Ph: [phone] Fax: [phone]

## 2015-04-08 ENCOUNTER — Telehealth: Payer: Self-pay | Admitting: Neurology

## 2015-04-08 LAB — COMPREHENSIVE METABOLIC PANEL
A/G RATIO: 2 (ref 1.1–2.5)
ALT: 17 IU/L (ref 0–44)
AST: 22 IU/L (ref 0–40)
Albumin: 4.5 g/dL (ref 3.5–4.8)
Alkaline Phosphatase: 47 IU/L (ref 39–117)
BILIRUBIN TOTAL: 0.5 mg/dL (ref 0.0–1.2)
BUN/Creatinine Ratio: 15 (ref 10–22)
BUN: 33 mg/dL — ABNORMAL HIGH (ref 8–27)
CHLORIDE: 94 mmol/L — AB (ref 96–106)
CO2: 23 mmol/L (ref 18–29)
Calcium: 9.9 mg/dL (ref 8.6–10.2)
Creatinine, Ser: 2.15 mg/dL — ABNORMAL HIGH (ref 0.76–1.27)
GFR calc non Af Amer: 30 mL/min/{1.73_m2} — ABNORMAL LOW (ref >59)
GFR, EST AFRICAN AMERICAN: 35 mL/min/{1.73_m2} — AB (ref >59)
Globulin, Total: 2.3 g/dL (ref 1.5–4.5)
Glucose: 107 mg/dL — ABNORMAL HIGH (ref 65–99)
POTASSIUM: 6 mmol/L — AB (ref 3.5–5.2)
Sodium: 132 mmol/L — ABNORMAL LOW (ref 134–144)
Total Protein: 6.8 g/dL (ref 6.0–8.5)

## 2015-04-08 LAB — CBC
Hematocrit: 43.6 % (ref 37.5–51.0)
Hemoglobin: 14.3 g/dL (ref 12.6–17.7)
MCH: 32.6 pg (ref 26.6–33.0)
MCHC: 32.8 g/dL (ref 31.5–35.7)
MCV: 100 fL — AB (ref 79–97)
PLATELETS: 411 10*3/uL — AB (ref 150–379)
RBC: 4.38 x10E6/uL (ref 4.14–5.80)
RDW: 15.7 % — ABNORMAL HIGH (ref 12.3–15.4)
WBC: 5.9 10*3/uL (ref 3.4–10.8)

## 2015-04-08 LAB — HGB A1C W/O EAG: Hgb A1c MFr Bld: 6.2 % — ABNORMAL HIGH (ref 4.8–5.6)

## 2015-04-08 NOTE — Telephone Encounter (Signed)
I have called Eugene Castaneda, repeat laboratory evaluation showed elevated creatinine 2.1 3, GFR 30, this is significant worsening compared to previous GFR of 67 month ago,  He will follow-up with his primary care, and nephrologist, urologist,  A1c was 6.2, mild improvement compared to previous 6.3.

## 2015-04-17 DIAGNOSIS — N35013 Post-traumatic anterior urethral stricture: Secondary | ICD-10-CM | POA: Diagnosis not present

## 2015-04-17 DIAGNOSIS — N5089 Other specified disorders of the male genital organs: Secondary | ICD-10-CM | POA: Diagnosis not present

## 2015-04-17 DIAGNOSIS — R938 Abnormal findings on diagnostic imaging of other specified body structures: Secondary | ICD-10-CM | POA: Diagnosis not present

## 2015-04-17 DIAGNOSIS — N393 Stress incontinence (female) (male): Secondary | ICD-10-CM | POA: Diagnosis not present

## 2015-04-17 DIAGNOSIS — N359 Urethral stricture, unspecified: Secondary | ICD-10-CM | POA: Diagnosis not present

## 2015-04-17 DIAGNOSIS — C61 Malignant neoplasm of prostate: Secondary | ICD-10-CM | POA: Diagnosis not present

## 2015-04-17 DIAGNOSIS — N320 Bladder-neck obstruction: Secondary | ICD-10-CM | POA: Diagnosis not present

## 2015-04-17 DIAGNOSIS — N529 Male erectile dysfunction, unspecified: Secondary | ICD-10-CM | POA: Diagnosis not present

## 2015-04-17 DIAGNOSIS — Z9889 Other specified postprocedural states: Secondary | ICD-10-CM | POA: Diagnosis not present

## 2015-04-19 DIAGNOSIS — N189 Chronic kidney disease, unspecified: Secondary | ICD-10-CM | POA: Diagnosis not present

## 2015-04-19 DIAGNOSIS — K219 Gastro-esophageal reflux disease without esophagitis: Secondary | ICD-10-CM | POA: Diagnosis not present

## 2015-04-19 DIAGNOSIS — R319 Hematuria, unspecified: Secondary | ICD-10-CM | POA: Diagnosis not present

## 2015-04-19 DIAGNOSIS — I4891 Unspecified atrial fibrillation: Secondary | ICD-10-CM | POA: Diagnosis not present

## 2015-04-19 DIAGNOSIS — E039 Hypothyroidism, unspecified: Secondary | ICD-10-CM | POA: Diagnosis not present

## 2015-04-19 DIAGNOSIS — Z87891 Personal history of nicotine dependence: Secondary | ICD-10-CM | POA: Diagnosis not present

## 2015-04-19 DIAGNOSIS — I129 Hypertensive chronic kidney disease with stage 1 through stage 4 chronic kidney disease, or unspecified chronic kidney disease: Secondary | ICD-10-CM | POA: Diagnosis not present

## 2015-04-19 DIAGNOSIS — N390 Urinary tract infection, site not specified: Secondary | ICD-10-CM | POA: Diagnosis not present

## 2015-04-19 DIAGNOSIS — T83098A Other mechanical complication of other indwelling urethral catheter, initial encounter: Secondary | ICD-10-CM | POA: Diagnosis not present

## 2015-04-19 DIAGNOSIS — T83010A Breakdown (mechanical) of cystostomy catheter, initial encounter: Secondary | ICD-10-CM | POA: Diagnosis not present

## 2015-04-19 DIAGNOSIS — E785 Hyperlipidemia, unspecified: Secondary | ICD-10-CM | POA: Diagnosis not present

## 2015-04-24 ENCOUNTER — Encounter: Payer: Self-pay | Admitting: Neurology

## 2015-04-24 DIAGNOSIS — K7689 Other specified diseases of liver: Secondary | ICD-10-CM | POA: Diagnosis not present

## 2015-04-24 DIAGNOSIS — N359 Urethral stricture, unspecified: Secondary | ICD-10-CM | POA: Diagnosis not present

## 2015-04-24 DIAGNOSIS — Z9079 Acquired absence of other genital organ(s): Secondary | ICD-10-CM | POA: Diagnosis not present

## 2015-04-24 DIAGNOSIS — T83111S Breakdown (mechanical) of urinary sphincter implant, sequela: Secondary | ICD-10-CM | POA: Diagnosis not present

## 2015-04-24 DIAGNOSIS — J984 Other disorders of lung: Secondary | ICD-10-CM | POA: Diagnosis not present

## 2015-04-24 DIAGNOSIS — I708 Atherosclerosis of other arteries: Secondary | ICD-10-CM | POA: Diagnosis not present

## 2015-04-24 DIAGNOSIS — Z960 Presence of urogenital implants: Secondary | ICD-10-CM | POA: Diagnosis not present

## 2015-04-24 DIAGNOSIS — R918 Other nonspecific abnormal finding of lung field: Secondary | ICD-10-CM | POA: Diagnosis not present

## 2015-04-24 DIAGNOSIS — D7389 Other diseases of spleen: Secondary | ICD-10-CM | POA: Diagnosis not present

## 2015-04-24 DIAGNOSIS — N4889 Other specified disorders of penis: Secondary | ICD-10-CM | POA: Diagnosis not present

## 2015-04-24 DIAGNOSIS — Z9889 Other specified postprocedural states: Secondary | ICD-10-CM | POA: Diagnosis not present

## 2015-04-24 DIAGNOSIS — I2510 Atherosclerotic heart disease of native coronary artery without angina pectoris: Secondary | ICD-10-CM | POA: Diagnosis not present

## 2015-04-24 DIAGNOSIS — N393 Stress incontinence (female) (male): Secondary | ICD-10-CM | POA: Diagnosis not present

## 2015-04-24 DIAGNOSIS — K5730 Diverticulosis of large intestine without perforation or abscess without bleeding: Secondary | ICD-10-CM | POA: Diagnosis not present

## 2015-04-24 DIAGNOSIS — R938 Abnormal findings on diagnostic imaging of other specified body structures: Secondary | ICD-10-CM | POA: Diagnosis not present

## 2015-04-24 DIAGNOSIS — K429 Umbilical hernia without obstruction or gangrene: Secondary | ICD-10-CM | POA: Diagnosis not present

## 2015-05-02 DIAGNOSIS — Z435 Encounter for attention to cystostomy: Secondary | ICD-10-CM | POA: Diagnosis not present

## 2015-05-02 DIAGNOSIS — N358 Other urethral stricture: Secondary | ICD-10-CM | POA: Diagnosis not present

## 2015-05-02 DIAGNOSIS — N393 Stress incontinence (female) (male): Secondary | ICD-10-CM | POA: Diagnosis not present

## 2015-05-02 DIAGNOSIS — N359 Urethral stricture, unspecified: Secondary | ICD-10-CM | POA: Diagnosis not present

## 2015-05-02 DIAGNOSIS — T83591A Infection and inflammatory reaction due to implanted urinary sphincter, initial encounter: Secondary | ICD-10-CM | POA: Diagnosis not present

## 2015-05-02 DIAGNOSIS — T83111A Breakdown (mechanical) of urinary sphincter implant, initial encounter: Secondary | ICD-10-CM | POA: Diagnosis not present

## 2015-05-02 DIAGNOSIS — Z466 Encounter for fitting and adjustment of urinary device: Secondary | ICD-10-CM | POA: Diagnosis not present

## 2015-05-03 DIAGNOSIS — T83591A Infection and inflammatory reaction due to implanted urinary sphincter, initial encounter: Secondary | ICD-10-CM | POA: Diagnosis not present

## 2015-05-03 DIAGNOSIS — N393 Stress incontinence (female) (male): Secondary | ICD-10-CM | POA: Diagnosis not present

## 2015-05-03 DIAGNOSIS — N359 Urethral stricture, unspecified: Secondary | ICD-10-CM | POA: Diagnosis not present

## 2015-05-04 DIAGNOSIS — N359 Urethral stricture, unspecified: Secondary | ICD-10-CM | POA: Diagnosis not present

## 2015-05-04 DIAGNOSIS — N393 Stress incontinence (female) (male): Secondary | ICD-10-CM | POA: Diagnosis not present

## 2015-05-04 DIAGNOSIS — T83591A Infection and inflammatory reaction due to implanted urinary sphincter, initial encounter: Secondary | ICD-10-CM | POA: Diagnosis not present

## 2015-05-12 DIAGNOSIS — E784 Other hyperlipidemia: Secondary | ICD-10-CM | POA: Diagnosis not present

## 2015-05-12 DIAGNOSIS — R7301 Impaired fasting glucose: Secondary | ICD-10-CM | POA: Diagnosis not present

## 2015-05-12 DIAGNOSIS — I10 Essential (primary) hypertension: Secondary | ICD-10-CM | POA: Diagnosis not present

## 2015-05-12 DIAGNOSIS — E039 Hypothyroidism, unspecified: Secondary | ICD-10-CM | POA: Diagnosis not present

## 2015-05-12 DIAGNOSIS — Z125 Encounter for screening for malignant neoplasm of prostate: Secondary | ICD-10-CM | POA: Diagnosis not present

## 2015-05-19 DIAGNOSIS — Z1389 Encounter for screening for other disorder: Secondary | ICD-10-CM | POA: Diagnosis not present

## 2015-05-19 DIAGNOSIS — N183 Chronic kidney disease, stage 3 (moderate): Secondary | ICD-10-CM | POA: Diagnosis not present

## 2015-05-19 DIAGNOSIS — Z0000 Encounter for general adult medical examination without abnormal findings: Secondary | ICD-10-CM | POA: Diagnosis not present

## 2015-05-19 DIAGNOSIS — C61 Malignant neoplasm of prostate: Secondary | ICD-10-CM | POA: Diagnosis not present

## 2015-05-19 DIAGNOSIS — R7301 Impaired fasting glucose: Secondary | ICD-10-CM | POA: Diagnosis not present

## 2015-05-19 DIAGNOSIS — I10 Essential (primary) hypertension: Secondary | ICD-10-CM | POA: Diagnosis not present

## 2015-05-19 DIAGNOSIS — E039 Hypothyroidism, unspecified: Secondary | ICD-10-CM | POA: Diagnosis not present

## 2015-05-19 DIAGNOSIS — I480 Paroxysmal atrial fibrillation: Secondary | ICD-10-CM | POA: Diagnosis not present

## 2015-05-19 DIAGNOSIS — L509 Urticaria, unspecified: Secondary | ICD-10-CM | POA: Diagnosis not present

## 2015-05-19 DIAGNOSIS — Z9359 Other cystostomy status: Secondary | ICD-10-CM | POA: Diagnosis not present

## 2015-05-19 DIAGNOSIS — G7000 Myasthenia gravis without (acute) exacerbation: Secondary | ICD-10-CM | POA: Diagnosis not present

## 2015-05-19 DIAGNOSIS — E784 Other hyperlipidemia: Secondary | ICD-10-CM | POA: Diagnosis not present

## 2015-05-22 DIAGNOSIS — Z1212 Encounter for screening for malignant neoplasm of rectum: Secondary | ICD-10-CM | POA: Diagnosis not present

## 2015-05-25 DIAGNOSIS — C61 Malignant neoplasm of prostate: Secondary | ICD-10-CM | POA: Diagnosis not present

## 2015-05-28 DIAGNOSIS — N393 Stress incontinence (female) (male): Secondary | ICD-10-CM | POA: Diagnosis not present

## 2015-05-28 DIAGNOSIS — H2513 Age-related nuclear cataract, bilateral: Secondary | ICD-10-CM | POA: Diagnosis not present

## 2015-05-28 DIAGNOSIS — E119 Type 2 diabetes mellitus without complications: Secondary | ICD-10-CM | POA: Diagnosis not present

## 2015-06-11 DIAGNOSIS — N393 Stress incontinence (female) (male): Secondary | ICD-10-CM | POA: Diagnosis not present

## 2015-06-17 DIAGNOSIS — R339 Retention of urine, unspecified: Secondary | ICD-10-CM | POA: Diagnosis not present

## 2015-06-22 DIAGNOSIS — R339 Retention of urine, unspecified: Secondary | ICD-10-CM | POA: Diagnosis not present

## 2015-06-25 DIAGNOSIS — N35013 Post-traumatic anterior urethral stricture: Secondary | ICD-10-CM | POA: Diagnosis not present

## 2015-06-25 DIAGNOSIS — N4889 Other specified disorders of penis: Secondary | ICD-10-CM | POA: Diagnosis not present

## 2015-06-25 DIAGNOSIS — N393 Stress incontinence (female) (male): Secondary | ICD-10-CM | POA: Diagnosis not present

## 2015-06-25 DIAGNOSIS — C61 Malignant neoplasm of prostate: Secondary | ICD-10-CM | POA: Diagnosis not present

## 2015-06-30 DIAGNOSIS — R339 Retention of urine, unspecified: Secondary | ICD-10-CM | POA: Diagnosis not present

## 2015-07-27 DIAGNOSIS — R339 Retention of urine, unspecified: Secondary | ICD-10-CM | POA: Diagnosis not present

## 2015-08-24 DIAGNOSIS — C61 Malignant neoplasm of prostate: Secondary | ICD-10-CM | POA: Diagnosis not present

## 2015-08-27 DIAGNOSIS — R339 Retention of urine, unspecified: Secondary | ICD-10-CM | POA: Diagnosis not present

## 2015-08-31 DIAGNOSIS — Z8546 Personal history of malignant neoplasm of prostate: Secondary | ICD-10-CM | POA: Diagnosis not present

## 2015-08-31 DIAGNOSIS — N5231 Erectile dysfunction following radical prostatectomy: Secondary | ICD-10-CM | POA: Diagnosis not present

## 2015-08-31 DIAGNOSIS — N393 Stress incontinence (female) (male): Secondary | ICD-10-CM | POA: Diagnosis not present

## 2015-09-25 DIAGNOSIS — R339 Retention of urine, unspecified: Secondary | ICD-10-CM | POA: Diagnosis not present

## 2015-09-28 DIAGNOSIS — I10 Essential (primary) hypertension: Secondary | ICD-10-CM | POA: Diagnosis not present

## 2015-09-28 DIAGNOSIS — I480 Paroxysmal atrial fibrillation: Secondary | ICD-10-CM | POA: Diagnosis not present

## 2015-09-28 DIAGNOSIS — N183 Chronic kidney disease, stage 3 (moderate): Secondary | ICD-10-CM | POA: Diagnosis not present

## 2015-09-28 DIAGNOSIS — I2510 Atherosclerotic heart disease of native coronary artery without angina pectoris: Secondary | ICD-10-CM | POA: Diagnosis not present

## 2015-09-29 DIAGNOSIS — I480 Paroxysmal atrial fibrillation: Secondary | ICD-10-CM | POA: Diagnosis not present

## 2015-09-29 DIAGNOSIS — R002 Palpitations: Secondary | ICD-10-CM | POA: Diagnosis not present

## 2015-10-05 ENCOUNTER — Telehealth: Payer: Self-pay | Admitting: Cardiology

## 2015-10-05 NOTE — Telephone Encounter (Signed)
[facility] heart monitoring called to state that patient had bradycardia as low as 30bpm while sleeping.  Now heart rate is sinus bradycardia at 50bpm.  Could not reach patient.

## 2015-10-08 ENCOUNTER — Encounter: Payer: Self-pay | Admitting: Neurology

## 2015-10-08 ENCOUNTER — Ambulatory Visit (INDEPENDENT_AMBULATORY_CARE_PROVIDER_SITE_OTHER): Payer: Medicare Other | Admitting: Neurology

## 2015-10-08 VITALS — BP 173/75 | HR 48 | Ht 71.5 in | Wt 188.5 lb

## 2015-10-08 DIAGNOSIS — R7309 Other abnormal glucose: Secondary | ICD-10-CM | POA: Diagnosis not present

## 2015-10-08 DIAGNOSIS — G7000 Myasthenia gravis without (acute) exacerbation: Secondary | ICD-10-CM

## 2015-10-08 DIAGNOSIS — R202 Paresthesia of skin: Secondary | ICD-10-CM | POA: Diagnosis not present

## 2015-10-08 MED ORDER — AZATHIOPRINE 100 MG PO TABS: 100.0000 mg | ORAL_TABLET | Freq: Two times a day (BID) | ORAL | 4 refills | Status: DC

## 2015-10-08 NOTE — Progress Notes (Addendum)
Chief Complaint  Patient presents with  . Myasthenia Gravis    He is still doing well on Imuran.  Reports starting to lose weight after having surgery on 05/02/15.  Says his appetite has still not returned to normal.  He is currently wearing a 30-day event monitor to evaluate his low heart rate.  . Impaired Glucose    He is still checking his blood sugar every 4-5 days.  His readings have stayed in the 90s range.  His A1C was 5.8 on 05/14/15.      PATIENT: Eugene Castaneda DOB: [birth year]  HISTORICAL( initial visit May 4th 2016)  Eugene Castaneda is here to follow-up for serum positive generalized myasthenia gravis. 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 Dagmar Hait in May 2016 for evaluation of double vision, he also has intermittent left ptosis, at its worst, he also has mild bulbar, neck flexion weakness, mild chewing difficulty, mild proximal upper extremity weakness. He never had significant swallowing, breathing difficulty, no gait difficulty  CT chest showed no evidence of thymoma May 2016 MRI of the brain in May 2016 showed mild atrophy His symptoms responded very well to Mestinon 60 mg 3 times a day, no significant side effect Prednisone was started since May 2016, up to 40 mg daily, he responded well, was able to tolerate tapering dose of prednisone  Imuran was started in May 21st 2016, 50 mg 2 tablets twice a day  He has urinary incontinence, planning on to have urethral reconstruction surgery by [facility] urologist Dr.Terlecki  Laboratory evaluation in June 2016 with normal CBC, worsening creatinine 1.64, mild elevation  from baseline of 1.3, he has mild abnormal glucose, A1c was 6.1, Accu-Chek glucose level was 100-110 while he was treated with prednisone  UPDATE Sep 7th 2016: He only has mild occasionally double vision especially when looking to the far right side, no chewing difficulty, no jaw fatigue, He is on Imuran 100 mg twice a day, prednisone 10 mg daily, mestinon 30mg  tid glucose level was mildly high average 113 in August, 2016  I reviewed [facility] record, laboratory September 15 2014, creatinine 1.22, glucose 122.   UPDATE Jan 07 2015: He had urethroplasty in 10/2013. artificial urethral sphincter placement in August 15th 2016, which works well for him, he denies bowel and bladder incontinence,  He has stopped prednisone since October fourth 2016, there was no worsening of his symptoms, he denies double vision, no longer require Mestinon,  He exercise regularly, but since end of September 2016, he noticed penis area paresthesia, burning sensation around the rim of the gland, sometimes the tip, getting worse with movement, sometimes woke up during sleep with stabbing pain, burning sensation  He denies low back pain, no gait difficulties  UPDATE April 07 2015: He had artificail urithoersphinctuer in Oct 71, gabapentin did hot help his pelvic area pain, in March 2017, he developed urinary retention, was treated with antibiotic, eventually had suprapubic catheter, recent evaluation of cystoscope showed inflamed urethra tissues  He has been stable from myasthenia gravis standpoint, no double vision, no ptosis, no limb muscle weakness, he was not able to exercise regularly  UPDATE Sept 7th 2017: He had artifical urethral  sphinter removed on April 70 he was treated with antibiotics, has lost appetited, lost 25 LB, he still has superbupic catheter, able to swim, he does exercise regularly  His MG is under good control, he is taking Imuran supposed to 200 mg daily, he might mistakenly taking 400 mg  daily, no longer require Mestinon,  I reviewed laboratory evaluation from his primary care Dr. Danna Hefty office   glucose level was 96, normal CMP, creatinine of 1.3, GFR 8 was 54.6, mildly decreased WBC 3.4, hemoglobin was 12 point 5, mild elevated triglycerides 174, LDL 55, A1c 5.8, normal TSH, PSA was 0.069,  He is also seeing cardiologist [name], receiving Cardiac monitoring reported bradycardia episode, 25/m during sleep,  REVIEW OF SYSTEMS: Full 14 system review of systems performed and notable only for as above ALLERGIES: No Known Allergies  HOME MEDICATIONS: Current Outpatient Prescriptions  Medication Sig Dispense Refill  . aspirin 81 MG tablet Take 81 mg by mouth daily.    . cholecalciferol (VITAMIN D) 1000 UNITS tablet Take 1,000 Units by mouth daily.    . fenofibrate 160 MG tablet Take 160 mg by mouth daily.    . Fish Oil OIL 1 tablet by Does not apply route daily.    . flecainide (TAMBOCOR) 50 MG tablet Take 50 mg by mouth 2 (two) times daily.    Marland Kitchen Leuprolide Acetate (LUPRON IJ) Inject as directed every 3 (three) months.    . levothyroxine (SYNTHROID, LEVOTHROID) 175 MCG tablet Take 175 mcg by mouth daily before breakfast.    . lisinopril (PRINIVIL,ZESTRIL) 20 MG tablet Take 20 mg by mouth daily.    . Multiple Vitamin (MULTIVITAMIN) tablet Take 1 tablet by mouth daily.    Marland Kitchen omeprazole (PRILOSEC) 20 MG capsule Take 20 mg by mouth daily.    . Pomegranate, Punica granatum, 150 MG CAPS Take 1 tablet by mouth daily.       PAST MEDICAL HISTORY: Past Medical History:  Diagnosis Date  . Atrial fibrillation ([place])   . Diplopia   . GERD (gastroesophageal reflux disease)   . Hyperlipemia   . Hypertension   . Hypothyroidism   . Impaired fasting glucose   . Prostate cancer ([place])   . Urethral obstruction   . Urinary retention    Suprapubic Catheter placed 04/25/15    PAST SURGICAL HISTORY: Past Surgical History:  Procedure Laterality Date  . Artificial Urinary Sphincter      . catheter ablationfor afib    . COLONOSCOPY    . PROSTATECTOMY    . TONSILLECTOMY      FAMILY HISTORY: Family History  Problem Relation Age of Onset  . Parkinson's disease Father   . Breast cancer Mother     SOCIAL HISTORY:  History   Social History  . Marital Status: Married    Spouse Name: N/A  . Number of Children: 2  . Years of Education: Masters   Occupational History  . Retired from Network engineer job    Social History Main Topics  . Smoking status: Former Smoker    Quit date: 02/01/1980  . Smokeless tobacco: Not on file  . Alcohol Use: 0.0 oz/week    0 Standard drinks or equivalent per week     Comment: 2 glasses red wine daily  . Drug Use: No  . Sexual Activity: Not on file   Other Topics Concern  . Not on file   Social History Narrative   Lives at home with spouse.   Right-handed.   2  cups caffeine daily.     PHYSICAL EXAM   Vitals:   10/08/15 0925  BP: (!) 173/75  Pulse: (!) 48  Weight: 188 lb 8 oz (85.5 kg)  Height: 5' 11.5" (1.816 m)    Not recorded      Body mass index is 25.92 kg/m.  PHYSICAL EXAMNIATION:  Gen: NAD, conversant, well nourised, obese, well groomed                     Cardiovascular: Regular rate rhythm, no peripheral edema, warm, nontender. Eyes: Conjunctivae clear without exudates or hemorrhage Neck: Supple, no carotid bruise. Pulmonary: Clear to auscultation bilaterally   NEUROLOGICAL EXAM:  MENTAL STATUS: Speech:    Speech is normal; fluent and spontaneous with normal comprehension.  Cognition:    The patient is oriented to person, place, and time;     recent and remote memory intact;     language fluent;     normal attention, concentration,     fund of knowledge.  CRANIAL NERVES: CN II: Visual fields are full to confrontation. Pupils are 4 mm and briskly reactive to light.   CN III, IV, VI: extraocular movement are normal. I did not appreciate any ptosis today, Rent less testing, cover and uncover testing  demonstrated mild bilateral medial rectus muscle weakness, with mild bilateral exophoria CN V: Facial sensation is intact to pinprick in all 3 divisions bilaterally. Corneal responses are intact.  CN VII: Face is symmetric, he has mild bilateral eye closure and cheek puff weakness CN VIII: Hearing is normal to rubbing fingers CN IX, X: Palate elevates symmetrically. Phonation is normal. CN XI: Head turning and shoulder shrug are intact CN XII: Tongue is midline with normal movements and no atrophy.  MOTOR: He has mild neck flexion, bilateral shoulder extension, abduction, external rotation, mild bilateral hip flexion weakness.  REFLEXES: Reflexes are 2+ and symmetric at the biceps, triceps, knees, and ankles. Plantar responses are flexor.  SENSORY: Light touch, pinprick, position sense, and vibration sense are intact in fingers and toes.  COORDINATION: Rapid alternating movements and fine finger movements are intact. There is no dysmetria on finger-to-nose and heel-knee-shin. There are no abnormal or extraneous movements.   GAIT/STANCE: Posture is normal. Gait is steady with normal steps, base, arm swing, and turning. Heel and toe walking are normal. Tandem gait is normal.  Romberg is absent.   DIAGNOSTIC DATA (LABS, IMAGING, TESTING) - I reviewed patient records, labs, notes, testing and imaging myself where available.  ASSESSMENT AND PLAN  DESTON GANE is a 70 y.o. male    Seropositive generalized myasthenia gravis,   keep Imuran 100 mg twice a day for total of 200 mg daily,  He still has mild eye-closure cheek puff, mild proximal upper and lower extremity weakness  Prednisone was tapered off since October 2016, he had no recurrent weakness  Polypharmacy treatment  Laboratory evaluation today (he might made a mistake by taking Imuran 400 mg daily instead of intended 200 mg daily, repeat laboratory evaluation in April 2017 showed mildly decreased WBC 3.4)  We have  reviewed potential side effect of Imuran, this includes immunosuppressive, bone marrow suppression, abnormal liver function, even bradycardia  Marcial Pacas, M.D. Ph.D.  [facility] [address], [place], [place] [address] Ph: [phone] Fax: [phone]  Addendum: I reviewed primary care note from Dr.[name], he was found to have mild abnormal liver functional tests, highly suspected for recent initiation of atrovastatin, also complicated by  Imuran use, wine intake, will have repeat laboratory evaluation on September twelfth 2017, if still elevated, he will hold atrovastatin for 4 weeks, and recheck,  A1c was 5.6, 5.8,

## 2015-10-09 LAB — COMPREHENSIVE METABOLIC PANEL
A/G RATIO: 2 (ref 1.2–2.2)
ALK PHOS: 62 IU/L (ref 39–117)
ALT: 71 IU/L — ABNORMAL HIGH (ref 0–44)
AST: 92 IU/L — AB (ref 0–40)
Albumin: 4.4 g/dL (ref 3.5–4.8)
BUN / CREAT RATIO: 15 (ref 10–24)
BUN: 13 mg/dL (ref 8–27)
Bilirubin Total: 1 mg/dL (ref 0.0–1.2)
CO2: 23 mmol/L (ref 18–29)
CREATININE: 0.85 mg/dL (ref 0.76–1.27)
Calcium: 9.7 mg/dL (ref 8.6–10.2)
Chloride: 102 mmol/L (ref 96–106)
GFR calc Af Amer: 102 mL/min/{1.73_m2} (ref >59)
GFR calc non Af Amer: 88 mL/min/{1.73_m2} (ref >59)
GLOBULIN, TOTAL: 2.2 g/dL (ref 1.5–4.5)
Glucose: 100 mg/dL — ABNORMAL HIGH (ref 65–99)
POTASSIUM: 5.4 mmol/L — AB (ref 3.5–5.2)
Sodium: 142 mmol/L (ref 134–144)
Total Protein: 6.6 g/dL (ref 6.0–8.5)

## 2015-10-09 LAB — CBC WITH DIFFERENTIAL/PLATELET
BASOS: 0 %
Basophils Absolute: 0 10*3/uL (ref 0.0–0.2)
EOS (ABSOLUTE): 0.1 10*3/uL (ref 0.0–0.4)
EOS: 2 %
HEMATOCRIT: 42.7 % (ref 37.5–51.0)
HEMOGLOBIN: 14.4 g/dL (ref 12.6–17.7)
Immature Grans (Abs): 0 10*3/uL (ref 0.0–0.1)
Immature Granulocytes: 0 %
LYMPHS ABS: 0.7 10*3/uL (ref 0.7–3.1)
Lymphs: 20 %
MCH: 32.5 pg (ref 26.6–33.0)
MCHC: 33.7 g/dL (ref 31.5–35.7)
MCV: 96 fL (ref 79–97)
MONOCYTES: 9 %
MONOS ABS: 0.3 10*3/uL (ref 0.1–0.9)
NEUTROS ABS: 2.2 10*3/uL (ref 1.4–7.0)
Neutrophils: 69 %
Platelets: 279 10*3/uL (ref 150–379)
RBC: 4.43 x10E6/uL (ref 4.14–5.80)
RDW: 14.7 % (ref 12.3–15.4)
WBC: 3.2 10*3/uL — ABNORMAL LOW (ref 3.4–10.8)

## 2015-10-12 DIAGNOSIS — I480 Paroxysmal atrial fibrillation: Secondary | ICD-10-CM | POA: Diagnosis not present

## 2015-10-13 DIAGNOSIS — R7301 Impaired fasting glucose: Secondary | ICD-10-CM | POA: Diagnosis not present

## 2015-10-13 DIAGNOSIS — E038 Other specified hypothyroidism: Secondary | ICD-10-CM | POA: Diagnosis not present

## 2015-10-13 DIAGNOSIS — I10 Essential (primary) hypertension: Secondary | ICD-10-CM | POA: Diagnosis not present

## 2015-10-13 DIAGNOSIS — E784 Other hyperlipidemia: Secondary | ICD-10-CM | POA: Diagnosis not present

## 2015-10-13 DIAGNOSIS — C61 Malignant neoplasm of prostate: Secondary | ICD-10-CM | POA: Diagnosis not present

## 2015-10-13 DIAGNOSIS — L989 Disorder of the skin and subcutaneous tissue, unspecified: Secondary | ICD-10-CM | POA: Diagnosis not present

## 2015-10-13 DIAGNOSIS — Z6826 Body mass index (BMI) 26.0-26.9, adult: Secondary | ICD-10-CM | POA: Diagnosis not present

## 2015-10-13 DIAGNOSIS — R945 Abnormal results of liver function studies: Secondary | ICD-10-CM | POA: Diagnosis not present

## 2015-10-13 DIAGNOSIS — G7000 Myasthenia gravis without (acute) exacerbation: Secondary | ICD-10-CM | POA: Diagnosis not present

## 2015-10-13 DIAGNOSIS — Z23 Encounter for immunization: Secondary | ICD-10-CM | POA: Diagnosis not present

## 2015-10-13 DIAGNOSIS — I480 Paroxysmal atrial fibrillation: Secondary | ICD-10-CM | POA: Diagnosis not present

## 2015-10-23 DIAGNOSIS — R339 Retention of urine, unspecified: Secondary | ICD-10-CM | POA: Diagnosis not present

## 2015-10-28 DIAGNOSIS — I2510 Atherosclerotic heart disease of native coronary artery without angina pectoris: Secondary | ICD-10-CM | POA: Diagnosis not present

## 2015-10-28 DIAGNOSIS — I480 Paroxysmal atrial fibrillation: Secondary | ICD-10-CM | POA: Diagnosis not present

## 2015-11-12 DIAGNOSIS — C44229 Squamous cell carcinoma of skin of left ear and external auricular canal: Secondary | ICD-10-CM | POA: Diagnosis not present

## 2015-11-12 DIAGNOSIS — L821 Other seborrheic keratosis: Secondary | ICD-10-CM | POA: Diagnosis not present

## 2015-11-17 DIAGNOSIS — E784 Other hyperlipidemia: Secondary | ICD-10-CM | POA: Diagnosis not present

## 2015-11-17 DIAGNOSIS — R945 Abnormal results of liver function studies: Secondary | ICD-10-CM | POA: Diagnosis not present

## 2015-11-24 DIAGNOSIS — R339 Retention of urine, unspecified: Secondary | ICD-10-CM | POA: Diagnosis not present

## 2015-11-30 DIAGNOSIS — C61 Malignant neoplasm of prostate: Secondary | ICD-10-CM | POA: Diagnosis not present

## 2015-12-01 DIAGNOSIS — H2513 Age-related nuclear cataract, bilateral: Secondary | ICD-10-CM | POA: Diagnosis not present

## 2015-12-08 DIAGNOSIS — C61 Malignant neoplasm of prostate: Secondary | ICD-10-CM | POA: Diagnosis not present

## 2015-12-08 DIAGNOSIS — N4889 Other specified disorders of penis: Secondary | ICD-10-CM | POA: Diagnosis not present

## 2015-12-08 DIAGNOSIS — N35011 Post-traumatic bulbous urethral stricture: Secondary | ICD-10-CM | POA: Diagnosis not present

## 2015-12-08 DIAGNOSIS — N393 Stress incontinence (female) (male): Secondary | ICD-10-CM | POA: Diagnosis not present

## 2015-12-18 DIAGNOSIS — R945 Abnormal results of liver function studies: Secondary | ICD-10-CM | POA: Diagnosis not present

## 2015-12-18 DIAGNOSIS — E784 Other hyperlipidemia: Secondary | ICD-10-CM | POA: Diagnosis not present

## 2015-12-22 DIAGNOSIS — R339 Retention of urine, unspecified: Secondary | ICD-10-CM | POA: Diagnosis not present

## 2015-12-23 DIAGNOSIS — C44229 Squamous cell carcinoma of skin of left ear and external auricular canal: Secondary | ICD-10-CM | POA: Diagnosis not present

## 2016-01-19 DIAGNOSIS — R339 Retention of urine, unspecified: Secondary | ICD-10-CM | POA: Diagnosis not present

## 2016-02-16 DIAGNOSIS — R339 Retention of urine, unspecified: Secondary | ICD-10-CM | POA: Diagnosis not present

## 2016-02-19 DIAGNOSIS — R945 Abnormal results of liver function studies: Secondary | ICD-10-CM | POA: Diagnosis not present

## 2016-02-19 DIAGNOSIS — E784 Other hyperlipidemia: Secondary | ICD-10-CM | POA: Diagnosis not present

## 2016-02-22 DIAGNOSIS — C61 Malignant neoplasm of prostate: Secondary | ICD-10-CM | POA: Diagnosis not present

## 2016-02-29 DIAGNOSIS — R339 Retention of urine, unspecified: Secondary | ICD-10-CM | POA: Diagnosis not present

## 2016-02-29 DIAGNOSIS — N528 Other male erectile dysfunction: Secondary | ICD-10-CM | POA: Diagnosis not present

## 2016-02-29 DIAGNOSIS — C61 Malignant neoplasm of prostate: Secondary | ICD-10-CM | POA: Diagnosis not present

## 2016-03-15 DIAGNOSIS — R339 Retention of urine, unspecified: Secondary | ICD-10-CM | POA: Diagnosis not present

## 2016-04-07 ENCOUNTER — Ambulatory Visit (INDEPENDENT_AMBULATORY_CARE_PROVIDER_SITE_OTHER): Payer: Medicare Other | Admitting: Neurology

## 2016-04-07 ENCOUNTER — Encounter: Payer: Self-pay | Admitting: Neurology

## 2016-04-07 VITALS — BP 140/77 | HR 51 | Ht 71.5 in | Wt 182.0 lb

## 2016-04-07 DIAGNOSIS — G7000 Myasthenia gravis without (acute) exacerbation: Secondary | ICD-10-CM

## 2016-04-07 NOTE — Progress Notes (Signed)
Chief Complaint  Patient presents with  . Myasthenia Gravis    His voice has been hoarse for 2-3 months.  He has also had a few episodes of swallowing difficulty.  After reading for long periods of time, his eyes become tired and he has to take breaks.      PATIENT: Eugene Castaneda DOB: 1945-11-10  HISTORICAL( initial visit May 4th 2016)  Eugene Castaneda is here to follow-up for serum positive generalized myasthenia gravis. 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 Dagmar Hait in May 2016 for evaluation of double vision, he also has intermittent left ptosis, at its worst, he also has mild bulbar, neck flexion weakness, mild chewing difficulty, mild proximal upper extremity weakness. He never had significant swallowing, breathing difficulty, no gait difficulty  CT chest showed no evidence of thymoma May 2016 MRI of the brain in May 2016 showed mild atrophy His symptoms responded very well to Mestinon 60 mg 3 times a day, no significant side effect Prednisone was started since May 2016, up to 40 mg daily, he responded well, was able to tolerate tapering dose of prednisone  Imuran was started in May 21st 2016, 50 mg 2 tablets twice a day  He has urinary incontinence, planning on to have urethral reconstruction surgery by [facility] urologist Dr.Terlecki  Laboratory evaluation in June 2016 with normal CBC, worsening creatinine 1.64, mild elevation from baseline of 1.3, he has mild abnormal glucose, A1c was 6.1, Accu-Chek glucose level was 100-110 while he was treated with prednisone  UPDATE Sep 7th 2016: He only has mild occasionally  double vision especially when looking to the far right side, no chewing difficulty, no jaw fatigue, He is on Imuran 100 mg twice a day, prednisone 10 mg daily, mestinon 30mg  tid glucose level was mildly high average 113 in August, 2016  I reviewed [facility] record, laboratory September 15 2014, creatinine 1.22, glucose 122.   UPDATE Jan 07 2015: He had urethroplasty in 10/2013. artificial urethral sphincter placement in August 15th 2016, which works well for him, he denies bowel and bladder incontinence,  He has stopped prednisone since October fourth 2016, there was no worsening of his symptoms, he denies double vision, no longer require Mestinon,  He exercise regularly, but since end of September 2016, he noticed penis area paresthesia, burning sensation around the rim of the gland, sometimes the tip, getting worse with movement, sometimes woke up during sleep with stabbing pain, burning sensation  He denies low back pain, no gait difficulties  UPDATE April 07 2015: He had artificail urithoersphinctuer in Oct 2017, gabapentin did hot help his pelvic area pain, in March 2017, he developed urinary retention, was treated with antibiotic, eventually had suprapubic catheter, recent evaluation of cystoscope showed inflamed urethra tissues  He has been stable from myasthenia gravis standpoint, no double vision, no ptosis, no limb muscle weakness, he was not able to exercise regularly  UPDATE Sept 7th 2017: He had artifical urethral sphinter removed on April 1st 2017, he was treated with antibiotics, has lost appetited, lost 25 LB, he still has superbupic catheter, able to swim, he does exercise regularly  His MG is under good control, he is taking  Imuran supposed to 200 mg daily, he might mistakenly taking 400 mg daily, no longer require Mestinon,  I reviewed laboratory evaluation from his primary care Dr. Danna Hefty office   glucose level was 96, normal CMP, creatinine of 1.3, GFR 8 was 54.6, mildly  decreased WBC 3.4, hemoglobin was 12 point 5, mild elevated triglycerides 174, LDL 55, A1c 5.8, normal TSH, PSA was 0.069,  He is also seeing cardiologist [name], receiving Cardiac monitoring reported bradycardia episode, 25/m during sleep  UPDATE April 07 2016:  I reviewed primary care note from Dr.[name], he was found to have mild abnormal liver functional tests, highly suspected for recent initiation of atrovastatin, also complicated by Imuran use, wine intake, will have repeat laboratory evaluation on September twelfth 2017, if still elevated, he will hold atrovastatin for 4 weeks, and recheck, A1c was 5.6,   Repeat laboratory evaluation in January 2018 showed AST 62, ALT 49, LDL 76, cholesterol 139, he is on lower dose of atorvastatin now,  He is now taking Imuran 50 mg 2 tablets twice a day, at extreme gaze he has mild double vision, no problem reading, he has several episodes he has mild swallowing trouble, he feels like that the steak stuck behind his sternum, he has mild hoarseness,    He was treated with Tamiflu in Jan 2018 as preventive medication.  He swim one hour without difficulty.    REVIEW OF SYSTEMS: Full 14 system review of systems performed and notable only for as above ALLERGIES: No Known Allergies  HOME MEDICATIONS: Current Outpatient Prescriptions  Medication Sig Dispense Refill  . aspirin 81 MG tablet Take 81 mg by mouth daily.    . cholecalciferol (VITAMIN D) 1000 UNITS tablet Take 1,000 Units by mouth daily.    . fenofibrate 160 MG tablet Take 160 mg by mouth daily.    . Fish Oil OIL 1 tablet by Does not apply route daily.    . flecainide (TAMBOCOR) 50 MG tablet Take 50 mg by mouth 2 (two) times daily.    Marland Kitchen Leuprolide Acetate (LUPRON IJ) Inject as directed every 3 (three) months.    . levothyroxine (SYNTHROID, LEVOTHROID) 175 MCG tablet Take 175 mcg by mouth daily before breakfast.    . lisinopril (PRINIVIL,ZESTRIL) 20 MG tablet Take 20 mg by mouth daily.      . Multiple Vitamin (MULTIVITAMIN) tablet Take 1 tablet by mouth daily.    Marland Kitchen omeprazole (PRILOSEC) 20 MG capsule Take 20 mg by mouth daily.    . Pomegranate, Punica granatum, 150 MG CAPS Take 1 tablet by mouth daily.       PAST MEDICAL HISTORY: Past Medical History:  Diagnosis Date  . Atrial fibrillation ([place])   . Diplopia   . GERD (gastroesophageal reflux disease)   . Hyperlipemia   . Hypertension   . Hypothyroidism   . Impaired fasting glucose   . Prostate cancer ([place])   . Urethral obstruction   . Urinary retention    Suprapubic Catheter placed 04/25/15    PAST SURGICAL HISTORY: Past Surgical History:  Procedure Laterality Date  . Artificial Urinary Sphincter    . catheter ablationfor afib    . COLONOSCOPY    . PROSTATECTOMY    . TONSILLECTOMY      FAMILY HISTORY: Family History  Problem Relation Age of Onset  . Parkinson's disease Father   . Breast cancer Mother     SOCIAL HISTORY:  History   Social History  . Marital Status: Married  Spouse Name: N/A  . Number of Children: 2  . Years of Education: Masters   Occupational History  . Retired from Network engineer job    Social History Main Topics  . Smoking status: Former Smoker    Quit date: 02/01/1980  . Smokeless tobacco: Not on file  . Alcohol Use: 0.0 oz/week    0 Standard drinks or equivalent per week     Comment: 2 glasses red wine daily  . Drug Use: No  . Sexual Activity: Not on file   Other Topics Concern  . Not on file   Social History Narrative   Lives at home with spouse.   Right-handed.   2 cups caffeine daily.     PHYSICAL EXAM   Vitals:   04/07/16 0803  BP: 140/77  Pulse: (!) 51  Weight: 182 lb (82.6 kg)  Height: 5' 11.5" (1.816 m)    Not recorded      Body mass index is 25.03 kg/m.  PHYSICAL EXAMNIATION:  Gen: NAD, conversant, well nourised, obese, well groomed                     Cardiovascular: Regular rate rhythm, no peripheral edema, warm, nontender. Eyes:  Conjunctivae clear without exudates or hemorrhage Neck: Supple, no carotid bruise. Pulmonary: Clear to auscultation bilaterally   NEUROLOGICAL EXAM:  MENTAL STATUS: Speech:    Speech is normal; fluent and spontaneous with normal comprehension.  Cognition:    The patient is oriented to person, place, and time;     recent and remote memory intact;     language fluent;     normal attention, concentration,     fund of knowledge.  CRANIAL NERVES: CN II: Visual fields are full to confrontation. Pupils are 4 mm and briskly reactive to light.   CN III, IV, VI: extraocular movement are normal. I did not appreciate any ptosis today,  cover and uncover testing showed no significant extraocular muscle weakness CN V: Facial sensation is intact to pinprick in all 3 divisions bilaterally. Corneal responses are intact.  CN VII: Face is symmetric, he has mild to moderate bilateral eye closure and cheek puff weakness CN VIII: Hearing is normal to rubbing fingers CN IX, X: Palate elevates symmetrically. Phonation is normal. CN XI: Head turning and shoulder shrug are intact CN XII: Tongue is midline with normal movements and no atrophy.  MOTOR: He has mild neck flexion, bilateral shoulder extension, abduction, external rotation, slight bilateral hip flexion weakness.  REFLEXES: Reflexes are 2+ and symmetric at the biceps, triceps, knees, and ankles. Plantar responses are flexor.  SENSORY: Light touch, pinprick, position sense, and vibration sense are intact in fingers and toes.  COORDINATION: Rapid alternating movements and fine finger movements are intact. There is no dysmetria on finger-to-nose and heel-knee-shin. There are no abnormal or extraneous movements.   GAIT/STANCE: Posture is normal. Gait is steady with normal steps, base, arm swing, and turning. Heel and toe walking are normal. Tandem gait is normal.  Romberg is absent.   DIAGNOSTIC DATA (LABS, IMAGING, TESTING) - I reviewed  patient records, labs, notes, testing and imaging myself where available.  ASSESSMENT AND PLAN  Eugene Castaneda is a 71 y.o. male    Seropositive generalized myasthenia gravis,   keep Imuran 100 mg twice a day for total of 200 mg daily,  He still has mild to moderate eye-closure cheek puff, mild proximal upper and lower extremity weakness  Prednisone was tapered off since October 2016,  he had no recurrent weakness, he is now taking Mestinon    Marcial Pacas, M.D. Ph.D.  [facility] [address] [place], [place] [address] Ph: [phone] Fax: [phone]

## 2016-04-12 DIAGNOSIS — R339 Retention of urine, unspecified: Secondary | ICD-10-CM | POA: Diagnosis not present

## 2016-05-02 DIAGNOSIS — I2510 Atherosclerotic heart disease of native coronary artery without angina pectoris: Secondary | ICD-10-CM | POA: Diagnosis not present

## 2016-05-02 DIAGNOSIS — I10 Essential (primary) hypertension: Secondary | ICD-10-CM | POA: Diagnosis not present

## 2016-05-02 DIAGNOSIS — E785 Hyperlipidemia, unspecified: Secondary | ICD-10-CM | POA: Diagnosis not present

## 2016-05-02 DIAGNOSIS — I480 Paroxysmal atrial fibrillation: Secondary | ICD-10-CM | POA: Diagnosis not present

## 2016-05-10 DIAGNOSIS — Z466 Encounter for fitting and adjustment of urinary device: Secondary | ICD-10-CM | POA: Diagnosis not present

## 2016-05-23 DIAGNOSIS — R8299 Other abnormal findings in urine: Secondary | ICD-10-CM | POA: Diagnosis not present

## 2016-05-23 DIAGNOSIS — I10 Essential (primary) hypertension: Secondary | ICD-10-CM | POA: Diagnosis not present

## 2016-05-23 DIAGNOSIS — E038 Other specified hypothyroidism: Secondary | ICD-10-CM | POA: Diagnosis not present

## 2016-05-23 DIAGNOSIS — N390 Urinary tract infection, site not specified: Secondary | ICD-10-CM | POA: Diagnosis not present

## 2016-05-23 DIAGNOSIS — E784 Other hyperlipidemia: Secondary | ICD-10-CM | POA: Diagnosis not present

## 2016-05-23 DIAGNOSIS — R7301 Impaired fasting glucose: Secondary | ICD-10-CM | POA: Diagnosis not present

## 2016-05-23 DIAGNOSIS — Z125 Encounter for screening for malignant neoplasm of prostate: Secondary | ICD-10-CM | POA: Diagnosis not present

## 2016-05-27 DIAGNOSIS — C61 Malignant neoplasm of prostate: Secondary | ICD-10-CM | POA: Diagnosis not present

## 2016-05-30 DIAGNOSIS — G7000 Myasthenia gravis without (acute) exacerbation: Secondary | ICD-10-CM | POA: Diagnosis not present

## 2016-05-30 DIAGNOSIS — E038 Other specified hypothyroidism: Secondary | ICD-10-CM | POA: Diagnosis not present

## 2016-05-30 DIAGNOSIS — C61 Malignant neoplasm of prostate: Secondary | ICD-10-CM | POA: Diagnosis not present

## 2016-05-30 DIAGNOSIS — I10 Essential (primary) hypertension: Secondary | ICD-10-CM | POA: Diagnosis not present

## 2016-05-30 DIAGNOSIS — R7301 Impaired fasting glucose: Secondary | ICD-10-CM | POA: Diagnosis not present

## 2016-05-30 DIAGNOSIS — Z1389 Encounter for screening for other disorder: Secondary | ICD-10-CM | POA: Diagnosis not present

## 2016-05-30 DIAGNOSIS — I480 Paroxysmal atrial fibrillation: Secondary | ICD-10-CM | POA: Diagnosis not present

## 2016-05-30 DIAGNOSIS — R945 Abnormal results of liver function studies: Secondary | ICD-10-CM | POA: Diagnosis not present

## 2016-05-30 DIAGNOSIS — N183 Chronic kidney disease, stage 3 (moderate): Secondary | ICD-10-CM | POA: Diagnosis not present

## 2016-05-30 DIAGNOSIS — E784 Other hyperlipidemia: Secondary | ICD-10-CM | POA: Diagnosis not present

## 2016-05-30 DIAGNOSIS — Z0000 Encounter for general adult medical examination without abnormal findings: Secondary | ICD-10-CM | POA: Diagnosis not present

## 2016-05-30 DIAGNOSIS — Z6825 Body mass index (BMI) 25.0-25.9, adult: Secondary | ICD-10-CM | POA: Diagnosis not present

## 2016-05-30 DIAGNOSIS — R339 Retention of urine, unspecified: Secondary | ICD-10-CM | POA: Diagnosis not present

## 2016-05-31 DIAGNOSIS — H2513 Age-related nuclear cataract, bilateral: Secondary | ICD-10-CM | POA: Diagnosis not present

## 2016-05-31 DIAGNOSIS — E119 Type 2 diabetes mellitus without complications: Secondary | ICD-10-CM | POA: Diagnosis not present

## 2016-06-01 DIAGNOSIS — Z1212 Encounter for screening for malignant neoplasm of rectum: Secondary | ICD-10-CM | POA: Diagnosis not present

## 2016-06-07 DIAGNOSIS — R339 Retention of urine, unspecified: Secondary | ICD-10-CM | POA: Diagnosis not present

## 2016-06-09 DIAGNOSIS — C61 Malignant neoplasm of prostate: Secondary | ICD-10-CM | POA: Diagnosis not present

## 2016-06-09 DIAGNOSIS — N393 Stress incontinence (female) (male): Secondary | ICD-10-CM | POA: Diagnosis not present

## 2016-06-16 DIAGNOSIS — D225 Melanocytic nevi of trunk: Secondary | ICD-10-CM | POA: Diagnosis not present

## 2016-06-16 DIAGNOSIS — Z85828 Personal history of other malignant neoplasm of skin: Secondary | ICD-10-CM | POA: Diagnosis not present

## 2016-06-16 DIAGNOSIS — B353 Tinea pedis: Secondary | ICD-10-CM | POA: Diagnosis not present

## 2016-06-16 DIAGNOSIS — L570 Actinic keratosis: Secondary | ICD-10-CM | POA: Diagnosis not present

## 2016-06-16 DIAGNOSIS — L814 Other melanin hyperpigmentation: Secondary | ICD-10-CM | POA: Diagnosis not present

## 2016-06-20 DIAGNOSIS — N3000 Acute cystitis without hematuria: Secondary | ICD-10-CM | POA: Diagnosis not present

## 2016-07-01 DIAGNOSIS — E039 Hypothyroidism, unspecified: Secondary | ICD-10-CM | POA: Diagnosis not present

## 2016-07-01 DIAGNOSIS — R945 Abnormal results of liver function studies: Secondary | ICD-10-CM | POA: Diagnosis not present

## 2016-07-01 DIAGNOSIS — E784 Other hyperlipidemia: Secondary | ICD-10-CM | POA: Diagnosis not present

## 2016-07-01 DIAGNOSIS — I480 Paroxysmal atrial fibrillation: Secondary | ICD-10-CM | POA: Diagnosis not present

## 2016-07-01 DIAGNOSIS — I10 Essential (primary) hypertension: Secondary | ICD-10-CM | POA: Diagnosis not present

## 2016-07-01 DIAGNOSIS — Z6825 Body mass index (BMI) 25.0-25.9, adult: Secondary | ICD-10-CM | POA: Diagnosis not present

## 2016-07-01 DIAGNOSIS — N183 Chronic kidney disease, stage 3 (moderate): Secondary | ICD-10-CM | POA: Diagnosis not present

## 2016-07-01 DIAGNOSIS — K219 Gastro-esophageal reflux disease without esophagitis: Secondary | ICD-10-CM | POA: Diagnosis not present

## 2016-07-04 ENCOUNTER — Other Ambulatory Visit: Payer: Self-pay | Admitting: Internal Medicine

## 2016-07-04 DIAGNOSIS — R945 Abnormal results of liver function studies: Secondary | ICD-10-CM

## 2016-07-06 ENCOUNTER — Encounter: Payer: Self-pay | Admitting: Internal Medicine

## 2016-07-06 ENCOUNTER — Ambulatory Visit (INDEPENDENT_AMBULATORY_CARE_PROVIDER_SITE_OTHER): Payer: Medicare Other | Admitting: Internal Medicine

## 2016-07-06 VITALS — BP 126/62 | HR 60 | Ht 70.25 in | Wt 187.1 lb

## 2016-07-06 DIAGNOSIS — R490 Dysphonia: Secondary | ICD-10-CM

## 2016-07-06 DIAGNOSIS — R1310 Dysphagia, unspecified: Secondary | ICD-10-CM | POA: Diagnosis not present

## 2016-07-06 DIAGNOSIS — K219 Gastro-esophageal reflux disease without esophagitis: Secondary | ICD-10-CM

## 2016-07-06 NOTE — Patient Instructions (Signed)
Stay on your PPI  Call us back if you continue to have swallowing issues.  Please follow up in one year

## 2016-07-06 NOTE — Progress Notes (Addendum)
HISTORY OF PRESENT ILLNESS:  Eugene Castaneda is a 71 y.o. male with multiple medical problems including atrial fibrillation status post ablation, hyperlipidemia, hypertension, hypothyroidism, prostate cancer, myasthenia gravis, GERD, and adenomatous colon polyps. Patient is sent today by his primary care provider Dr. Dagmar Hait regarding problems with recurrent intermittent solid food dysphagia and hoarseness. The patient was last evaluated in this office May 2015 for Hemoccult-positive stool and GERD with intermittent dysphagia. See that dictation. He subsequently underwent colonoscopy and upper endoscopy on 07/11/2013. Colonoscopy revealed 3 diminutive colon polyps, cecal AVMs, and moderate left-sided diverticulosis. Upper endoscopy was essentially normal. At that point he had been on PPI for 1 month with resolution of dysphagia. No dilation performed. At some point after the patient states that he been taking his Prilosec sporadically. About 3 times per week. He started having problems with hoarseness and chronic throat clearing behavior. Has not seen ENT.  More recently he was encouraged by Dr. Dagmar Hait take his PPI regular. Now taking Prilosec 20 mg daily every day. Since instituting this strategy his dysphagia has resolved. No additional GI complaints.  REVIEW OF SYSTEMS:  All non-GI ROS negative except for hoarseness and throat clearing  Past Medical History:  Diagnosis Date  . Atrial fibrillation ([place])   . Diplopia   . GERD (gastroesophageal reflux disease)   . Hyperlipemia   . Hypertension   . Hypothyroidism   . Impaired fasting glucose   . MG (myasthenia gravis) ([place])   . Prostate cancer ([place])   . Urethral obstruction   . Urinary retention    Suprapubic Catheter placed 04/25/15    Past Surgical History:  Procedure Laterality Date  . Artificial Urinary Sphincter    . catheter ablationfor afib    . COLONOSCOPY    . PROSTATECTOMY  2009  . TONSILLECTOMY      Social History BRETTON Castaneda  reports that he quit smoking about 36 years ago. He has never used smokeless tobacco. He reports that he drinks alcohol. He reports that he does not use drugs.  family history includes Breast cancer in his mother; Parkinson's disease in his father.  No Known Allergies     PHYSICAL EXAMINATION: Vital signs: BP 126/62 (BP Location: Left Arm, Patient Position: Sitting, Cuff Size: Normal)   Pulse 60   Ht 5' 10.25" (1.784 m) Comment: height measured without shoes  Wt 187 lb 2 oz (84.9 kg)   BMI 26.66 kg/m   Constitutional: generally well-appearing, no acute distress Psychiatric: alert and oriented x3, cooperative Eyes: extraocular movements intact, anicteric, conjunctiva pink Mouth: oral pharynx moist, no lesions Neck: suppleWithout thyromegaly Lymph: no lymphadenopathy Cardiovascular: heart regular rate and rhythm, no murmur Lungs: clear to auscultation bilaterally Abdomen: soft, nontender, nondistended, no obvious ascites, no peritoneal signs, normal bowel sounds, no organomegaly Rectal:Omitted Extremities: no clubbing cyanosis or lower extremity edema bilaterally Skin: no lesions on visible extremities Neuro: No focal deficits. Cranial nerves intact  ASSESSMENT:  #1. Dysphagia. Suspect his dysphagia secondary to GERD. Improvement on PPI would suggest GERD related edema which has improved. Prior upper endoscopy 3 years ago unremarkable #2. History of adenomatous colon polyps. Due for follow-up June 2020 #3. Multiple medical problems   PLAN:  #1. We discussed several options today including barium esophagram, upper endoscopy with or without empiric dilation, and expectant management. At this point, I recommended: (1). Reflux precautions (2). Continue omeprazole 20 mg daily (3). Contact the office for recurrent issues with dysphagia. If so, would arrange upper endoscopy with  esophageal dilation.  #2. Surveillance colonoscopy 2020  #3. Interval follow-up as needed  25  minutes spent face-to-face with the patient. Greater than 50% a time she is for counseling regarding GERD, issues with dysphagia, and issues with hoarseness and throat clearing behavior as possibly related to GERD.

## 2016-07-07 DIAGNOSIS — R8290 Unspecified abnormal findings in urine: Secondary | ICD-10-CM | POA: Diagnosis not present

## 2016-07-07 DIAGNOSIS — R339 Retention of urine, unspecified: Secondary | ICD-10-CM | POA: Diagnosis not present

## 2016-07-08 ENCOUNTER — Ambulatory Visit
Admission: RE | Admit: 2016-07-08 | Discharge: 2016-07-08 | Disposition: A | Discharge status: Home / Self Care | Payer: Medicare Other | Source: Ambulatory Visit | Attending: Internal Medicine | Admitting: Internal Medicine

## 2016-07-08 DIAGNOSIS — K760 Fatty (change of) liver, not elsewhere classified: Secondary | ICD-10-CM | POA: Diagnosis not present

## 2016-07-08 DIAGNOSIS — R945 Abnormal results of liver function studies: Secondary | ICD-10-CM

## 2016-08-05 DIAGNOSIS — R339 Retention of urine, unspecified: Secondary | ICD-10-CM | POA: Diagnosis not present

## 2016-08-22 DIAGNOSIS — C61 Malignant neoplasm of prostate: Secondary | ICD-10-CM | POA: Diagnosis not present

## 2016-08-29 DIAGNOSIS — R339 Retention of urine, unspecified: Secondary | ICD-10-CM | POA: Diagnosis not present

## 2016-08-29 DIAGNOSIS — Z8546 Personal history of malignant neoplasm of prostate: Secondary | ICD-10-CM | POA: Diagnosis not present

## 2016-08-29 DIAGNOSIS — N5201 Erectile dysfunction due to arterial insufficiency: Secondary | ICD-10-CM | POA: Diagnosis not present

## 2016-09-02 DIAGNOSIS — R339 Retention of urine, unspecified: Secondary | ICD-10-CM | POA: Diagnosis not present

## 2016-09-29 DIAGNOSIS — R339 Retention of urine, unspecified: Secondary | ICD-10-CM | POA: Diagnosis not present

## 2016-10-05 DIAGNOSIS — R339 Retention of urine, unspecified: Secondary | ICD-10-CM | POA: Diagnosis not present

## 2016-10-10 ENCOUNTER — Encounter: Payer: Self-pay | Admitting: Neurology

## 2016-10-10 ENCOUNTER — Ambulatory Visit (INDEPENDENT_AMBULATORY_CARE_PROVIDER_SITE_OTHER): Payer: Medicare Other | Admitting: Neurology

## 2016-10-10 VITALS — BP 140/69 | HR 55 | Ht 70.25 in | Wt 188.2 lb

## 2016-10-10 DIAGNOSIS — G7000 Myasthenia gravis without (acute) exacerbation: Secondary | ICD-10-CM | POA: Diagnosis not present

## 2016-10-10 MED ORDER — AZATHIOPRINE 50 MG PO TABS: 50.0000 mg | ORAL_TABLET | Freq: Two times a day (BID) | ORAL | 4 refills | Status: DC

## 2016-10-10 NOTE — Progress Notes (Signed)
Chief Complaint  Patient presents with  . Myasthenia Gravis    He is here for his routine six month follow up with no new concerns today.      PATIENT: Eugene Castaneda DOB: 18-Mar-1945  HISTORICAL( initial visit May 4th 2016)  Eugene Castaneda is here to follow-up for serum positive generalized myasthenia gravis. 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 Dagmar Hait in May 2016 for evaluation of double vision, he also has intermittent left ptosis, at its worst, he also has mild bulbar, neck flexion weakness, mild chewing difficulty, mild proximal upper extremity weakness. He never had significant swallowing, breathing difficulty, no gait difficulty  CT chest showed no evidence of thymoma May 2016 MRI of the brain in May 2016 showed mild atrophy His symptoms responded very well to Mestinon 60 mg 3 times a day, no significant side effect Prednisone was started since May 2016, up to 40 mg daily, he responded well, was able to tolerate tapering dose of prednisone  Imuran was started in May 21st 2016, 50 mg 2 tablets twice a day  He has urinary incontinence, planning on to have urethral reconstruction surgery by [facility] urologist Dr.Terlecki  Laboratory evaluation in June 2016 with normal CBC, worsening creatinine 1.64, mild elevation from baseline of 1.3, he has mild abnormal glucose, A1c was 6.1, Accu-Chek glucose level was 100-110 while he was treated with prednisone  UPDATE Sep 7th 2016: He only has mild occasionally double vision especially when looking to the far right side, no chewing difficulty, no jaw fatigue, He is on  Imuran 100 mg twice a day, prednisone 10 mg daily, mestinon 30mg  tid glucose level was mildly high average 113 in August, 2016  I reviewed [facility] record, laboratory September 15 2014, creatinine 1.22, glucose 122.   UPDATE Jan 07 2015: He had urethroplasty in 10/2013. artificial urethral sphincter placement in August 15th 2016, which works well for him, he denies bowel and bladder incontinence,  He has stopped prednisone since October fourth 2016, there was no worsening of his symptoms, he denies double vision, no longer require Mestinon,  He exercise regularly, but since end of September 2016, he noticed penis area paresthesia, burning sensation around the rim of the gland, sometimes the tip, getting worse with movement, sometimes woke up during sleep with stabbing pain, burning sensation  He denies low back pain, no gait difficulties  UPDATE April 07 2015: He had artificail urithoersphinctuer in Oct 2017, gabapentin did hot help his pelvic area pain, in March 2017, he developed urinary retention, was treated with antibiotic, eventually had suprapubic catheter, recent evaluation of cystoscope showed inflamed urethra tissues  He has been stable from myasthenia gravis standpoint, no double vision, no ptosis, no limb muscle weakness, he was not able to exercise regularly  UPDATE Sept 7th 2017: He had artifical urethral sphinter removed on April 1st 2017, he was treated with antibiotics, has lost appetited, lost 25 LB, he still has superbupic catheter, able to swim, he does exercise regularly  His MG is under good control, he is taking Imuran supposed to 200 mg daily, he might mistakenly taking 400 mg daily, no longer require Mestinon,  I reviewed laboratory evaluation  from his primary care Dr. Danna Hefty office   glucose level was 96, normal CMP, creatinine of 1.3, GFR 8 was 54.6, mildly decreased WBC 3.4, hemoglobin was 12 point 5, mild elevated triglycerides 174, LDL 55, A1c 5.8, normal TSH, PSA was  0.069,  He is also seeing cardiologist [name], receiving Cardiac monitoring reported bradycardia episode, 25/m during sleep  UPDATE April 07 2016:  I reviewed primary care note from Dr.[name], he was found to have mild abnormal liver functional tests, highly suspected for recent initiation of atrovastatin, also complicated by Imuran use, wine intake, will have repeat laboratory evaluation on September twelfth 2017, if still elevated, he will hold atrovastatin for 4 weeks, and recheck, A1c was 5.6,   Repeat laboratory evaluation in January 2018 showed AST 62, ALT 49, LDL 76, cholesterol 139, he is on lower dose of atorvastatin now,  He is now taking Imuran 50 mg 2 tablets twice a day, at extreme gaze he has mild double vision, no problem reading, he has several episodes he has mild swallowing trouble, he feels like that the steak stuck behind his sternum, he has mild hoarseness,    He was treated with Tamiflu in Jan 2018 as preventive medication.  He swim one hour without difficulty.    UPDATE Sept 10 2018: He was seen by Dr. Radene Gunning on July 01 2016, lab evaluations showed CMP, glucose 110, creatinine 0.8, mild elevated AST 59, ALT 44, WBC was 3.79, hemoglobin of 14.2, normal thyroid functional tasks, LDL was 70, A1c 5.8.  He is taking Imuran 50 mg twice a day, doing very well, no significant side effect noticed, continue has mild double vision on extreme gaze, able to swim regularly  REVIEW OF SYSTEMS: Full 14 system review of systems performed and notable only for as above ALLERGIES: No Known Allergies  HOME MEDICATIONS: Current Outpatient Prescriptions  Medication Sig Dispense Refill  . aspirin 81 MG tablet Take 81 mg by mouth daily.    . cholecalciferol (VITAMIN D) 1000 UNITS tablet Take 1,000 Units by mouth daily.    . fenofibrate 160 MG tablet Take 160 mg by mouth daily.    . Fish Oil OIL 1 tablet by Does not apply route daily.    . flecainide (TAMBOCOR) 50 MG tablet Take 50 mg by  mouth 2 (two) times daily.    Marland Kitchen Leuprolide Acetate (LUPRON IJ) Inject as directed every 3 (three) months.    . levothyroxine (SYNTHROID, LEVOTHROID) 175 MCG tablet Take 175 mcg by mouth daily before breakfast.    . lisinopril (PRINIVIL,ZESTRIL) 20 MG tablet Take 20 mg by mouth daily.    . Multiple Vitamin (MULTIVITAMIN) tablet Take 1 tablet by mouth daily.    Marland Kitchen omeprazole (PRILOSEC) 20 MG capsule Take 20 mg by mouth daily.    . Pomegranate, Punica granatum, 150 MG CAPS Take 1 tablet by mouth daily.       PAST MEDICAL HISTORY: Past Medical History:  Diagnosis Date  . Atrial fibrillation ([place])   . Diplopia   . GERD (gastroesophageal reflux disease)   . Hyperlipemia   . Hypertension   . Hypothyroidism   . Impaired fasting glucose   . MG (myasthenia gravis) ([place])   . Prostate cancer ([place])   . Urethral obstruction   . Urinary retention    Suprapubic Catheter placed 04/25/15    PAST SURGICAL HISTORY: Past Surgical History:  Procedure Laterality Date  . Artificial Urinary Sphincter    . catheter ablationfor afib    .  COLONOSCOPY    . PROSTATECTOMY  2009  . TONSILLECTOMY      FAMILY HISTORY: Family History  Problem Relation Age of Onset  . Parkinson's disease Father   . Breast cancer Mother     SOCIAL HISTORY:  History   Social History  . Marital Status: Married    Spouse Name: N/A  . Number of Children: 2  . Years of Education: Masters   Occupational History  . Retired from Network engineer job    Social History Main Topics  . Smoking status: Former Smoker    Quit date: 02/01/1980  . Smokeless tobacco: Not on file  . Alcohol Use: 0.0 oz/week    0 Standard drinks or equivalent per week     Comment: 2 glasses red wine daily  . Drug Use: No  . Sexual Activity: Not on file   Other Topics Concern  . Not on file   Social History Narrative   Lives at home with spouse.   Right-handed.   2 cups caffeine daily.     PHYSICAL EXAM   Vitals:   10/10/16 0836  BP: 140/69   Pulse: (!) 55  Weight: 188 lb 4 oz (85.4 kg)  Height: 5' 10.25" (1.784 m)    Not recorded      Body mass index is 26.82 kg/m.  PHYSICAL EXAMNIATION:  Gen: NAD, conversant, well nourised, obese, well groomed                     Cardiovascular: Regular rate rhythm, no peripheral edema, warm, nontender. Eyes: Conjunctivae clear without exudates or hemorrhage Neck: Supple, no carotid bruise. Pulmonary: Clear to auscultation bilaterally   NEUROLOGICAL EXAM:  MENTAL STATUS: Speech:    Speech is normal; fluent and spontaneous with normal comprehension.  Cognition:    The patient is oriented to person, place, and time;     recent and remote memory intact;     language fluent;     normal attention, concentration,     fund of knowledge.  CRANIAL NERVES: CN II: Visual fields are full to confrontation. Pupils are 4 mm and briskly reactive to light.   CN III, IV, VI: extraocular movement are normal. I did not appreciate any ptosis today,  cover and uncover testing showed no significant extraocular muscle weakness CN V: Facial sensation is intact to pinprick in all 3 divisions bilaterally. Corneal responses are intact.  CN VII: Face is symmetric, he has mild to moderate bilateral eye closure and cheek puff weakness CN VIII: Hearing is normal to rubbing fingers CN IX, X: Palate elevates symmetrically. Phonation is normal. CN XI: Head turning and shoulder shrug are intact CN XII: Tongue is midline with normal movements and no atrophy.  MOTOR: He has mild neck flexion, bilateral shoulder extension, abduction, external rotation, slight bilateral hip flexion weakness.  REFLEXES: Reflexes are 2+ and symmetric at the biceps, triceps, knees, and ankles. Plantar responses are flexor.  SENSORY: Light touch, pinprick, position sense, and vibration sense are intact in fingers and toes.  COORDINATION: Rapid alternating movements and fine finger movements are intact. There is no dysmetria on  finger-to-nose and heel-knee-shin. There are no abnormal or extraneous movements.   GAIT/STANCE: Posture is normal. Gait is steady with normal steps, base, arm swing, and turning. Heel and toe walking are normal. Tandem gait is normal.  Romberg is absent.   DIAGNOSTIC DATA (LABS, IMAGING, TESTING) - I reviewed patient records, labs, notes, testing and imaging myself where available.  ASSESSMENT AND PLAN  Eugene Castaneda is a 71 y.o. male    Seropositive generalized myasthenia gravis,   keep Imuran 50 mg twice a day now  He still has mild to moderate eye-closure cheek puff, mild proximal upper and lower extremity weakness  Prednisone was tapered off since October 2016, he had no recurrent weakness,   He exercise, swimming regularly without much difficulty  Abnormal liver functional test,  It was thought due to statin use, he is on very low dose of Crestor 2.5 milligrams daily, LDL was 70   Marcial Pacas, M.D. Ph.D.  [facility] [address], [place], [place] [address] Ph: [phone] Fax: [phone]

## 2016-10-11 ENCOUNTER — Other Ambulatory Visit: Payer: Self-pay | Admitting: *Deleted

## 2016-10-11 ENCOUNTER — Encounter: Payer: Self-pay | Admitting: *Deleted

## 2016-10-11 ENCOUNTER — Encounter: Payer: Self-pay | Admitting: Neurology

## 2016-10-11 MED ORDER — AZATHIOPRINE 50 MG PO TABS: ORAL_TABLET | ORAL | 4 refills | Status: DC

## 2016-10-29 DIAGNOSIS — Z23 Encounter for immunization: Secondary | ICD-10-CM | POA: Diagnosis not present

## 2016-11-04 DIAGNOSIS — R339 Retention of urine, unspecified: Secondary | ICD-10-CM | POA: Diagnosis not present

## 2016-11-07 DIAGNOSIS — N183 Chronic kidney disease, stage 3 (moderate): Secondary | ICD-10-CM | POA: Diagnosis not present

## 2016-11-07 DIAGNOSIS — I495 Sick sinus syndrome: Secondary | ICD-10-CM | POA: Diagnosis not present

## 2016-11-07 DIAGNOSIS — I480 Paroxysmal atrial fibrillation: Secondary | ICD-10-CM | POA: Diagnosis not present

## 2016-11-07 DIAGNOSIS — E039 Hypothyroidism, unspecified: Secondary | ICD-10-CM | POA: Diagnosis not present

## 2016-11-07 DIAGNOSIS — I10 Essential (primary) hypertension: Secondary | ICD-10-CM | POA: Diagnosis not present

## 2016-11-07 DIAGNOSIS — I2510 Atherosclerotic heart disease of native coronary artery without angina pectoris: Secondary | ICD-10-CM | POA: Diagnosis not present

## 2016-11-07 DIAGNOSIS — Z79899 Other long term (current) drug therapy: Secondary | ICD-10-CM | POA: Diagnosis not present

## 2016-11-07 DIAGNOSIS — E785 Hyperlipidemia, unspecified: Secondary | ICD-10-CM | POA: Diagnosis not present

## 2016-11-28 DIAGNOSIS — C61 Malignant neoplasm of prostate: Secondary | ICD-10-CM | POA: Diagnosis not present

## 2016-11-29 DIAGNOSIS — C61 Malignant neoplasm of prostate: Secondary | ICD-10-CM | POA: Diagnosis not present

## 2016-11-29 DIAGNOSIS — E7849 Other hyperlipidemia: Secondary | ICD-10-CM | POA: Diagnosis not present

## 2016-11-29 DIAGNOSIS — R945 Abnormal results of liver function studies: Secondary | ICD-10-CM | POA: Diagnosis not present

## 2016-11-29 DIAGNOSIS — G7000 Myasthenia gravis without (acute) exacerbation: Secondary | ICD-10-CM | POA: Diagnosis not present

## 2016-11-29 DIAGNOSIS — N183 Chronic kidney disease, stage 3 (moderate): Secondary | ICD-10-CM | POA: Diagnosis not present

## 2016-11-29 DIAGNOSIS — Z6826 Body mass index (BMI) 26.0-26.9, adult: Secondary | ICD-10-CM | POA: Diagnosis not present

## 2016-11-29 DIAGNOSIS — I480 Paroxysmal atrial fibrillation: Secondary | ICD-10-CM | POA: Diagnosis not present

## 2016-11-29 DIAGNOSIS — R7301 Impaired fasting glucose: Secondary | ICD-10-CM | POA: Diagnosis not present

## 2016-11-29 DIAGNOSIS — I10 Essential (primary) hypertension: Secondary | ICD-10-CM | POA: Diagnosis not present

## 2016-12-01 DIAGNOSIS — E119 Type 2 diabetes mellitus without complications: Secondary | ICD-10-CM | POA: Diagnosis not present

## 2016-12-01 DIAGNOSIS — H2513 Age-related nuclear cataract, bilateral: Secondary | ICD-10-CM | POA: Diagnosis not present

## 2016-12-01 DIAGNOSIS — R339 Retention of urine, unspecified: Secondary | ICD-10-CM | POA: Diagnosis not present

## 2016-12-27 DIAGNOSIS — B353 Tinea pedis: Secondary | ICD-10-CM | POA: Diagnosis not present

## 2016-12-27 DIAGNOSIS — L578 Other skin changes due to chronic exposure to nonionizing radiation: Secondary | ICD-10-CM | POA: Diagnosis not present

## 2016-12-29 DIAGNOSIS — R339 Retention of urine, unspecified: Secondary | ICD-10-CM | POA: Diagnosis not present

## 2017-01-16 DIAGNOSIS — L578 Other skin changes due to chronic exposure to nonionizing radiation: Secondary | ICD-10-CM | POA: Diagnosis not present

## 2017-01-27 DIAGNOSIS — R339 Retention of urine, unspecified: Secondary | ICD-10-CM | POA: Diagnosis not present

## 2017-02-16 DIAGNOSIS — I10 Essential (primary) hypertension: Secondary | ICD-10-CM | POA: Diagnosis not present

## 2017-02-16 DIAGNOSIS — Z6826 Body mass index (BMI) 26.0-26.9, adult: Secondary | ICD-10-CM | POA: Diagnosis not present

## 2017-02-23 DIAGNOSIS — C61 Malignant neoplasm of prostate: Secondary | ICD-10-CM | POA: Diagnosis not present

## 2017-02-23 DIAGNOSIS — R339 Retention of urine, unspecified: Secondary | ICD-10-CM | POA: Diagnosis not present

## 2017-03-02 DIAGNOSIS — I10 Essential (primary) hypertension: Secondary | ICD-10-CM | POA: Diagnosis not present

## 2017-03-03 DIAGNOSIS — Z8546 Personal history of malignant neoplasm of prostate: Secondary | ICD-10-CM | POA: Diagnosis not present

## 2017-03-03 DIAGNOSIS — N35013 Post-traumatic anterior urethral stricture: Secondary | ICD-10-CM | POA: Diagnosis not present

## 2017-03-03 DIAGNOSIS — N320 Bladder-neck obstruction: Secondary | ICD-10-CM | POA: Diagnosis not present

## 2017-03-03 DIAGNOSIS — I10 Essential (primary) hypertension: Secondary | ICD-10-CM | POA: Diagnosis not present

## 2017-03-03 DIAGNOSIS — N528 Other male erectile dysfunction: Secondary | ICD-10-CM | POA: Diagnosis not present

## 2017-03-03 DIAGNOSIS — N393 Stress incontinence (female) (male): Secondary | ICD-10-CM | POA: Diagnosis not present

## 2017-03-03 DIAGNOSIS — Z9359 Other cystostomy status: Secondary | ICD-10-CM | POA: Diagnosis not present

## 2017-03-27 DIAGNOSIS — R339 Retention of urine, unspecified: Secondary | ICD-10-CM | POA: Diagnosis not present

## 2017-04-04 DIAGNOSIS — Z6826 Body mass index (BMI) 26.0-26.9, adult: Secondary | ICD-10-CM | POA: Diagnosis not present

## 2017-04-04 DIAGNOSIS — I10 Essential (primary) hypertension: Secondary | ICD-10-CM | POA: Diagnosis not present

## 2017-04-07 DIAGNOSIS — Z6826 Body mass index (BMI) 26.0-26.9, adult: Secondary | ICD-10-CM | POA: Diagnosis not present

## 2017-04-07 DIAGNOSIS — I10 Essential (primary) hypertension: Secondary | ICD-10-CM | POA: Diagnosis not present

## 2017-04-27 DIAGNOSIS — R339 Retention of urine, unspecified: Secondary | ICD-10-CM | POA: Diagnosis not present

## 2017-05-08 ENCOUNTER — Encounter: Payer: Self-pay | Admitting: Cardiology

## 2017-05-08 DIAGNOSIS — Z79899 Other long term (current) drug therapy: Secondary | ICD-10-CM | POA: Diagnosis not present

## 2017-05-08 DIAGNOSIS — I2510 Atherosclerotic heart disease of native coronary artery without angina pectoris: Secondary | ICD-10-CM | POA: Diagnosis not present

## 2017-05-08 DIAGNOSIS — N183 Chronic kidney disease, stage 3 (moderate): Secondary | ICD-10-CM | POA: Diagnosis not present

## 2017-05-08 DIAGNOSIS — I10 Essential (primary) hypertension: Secondary | ICD-10-CM | POA: Diagnosis not present

## 2017-05-08 DIAGNOSIS — G7000 Myasthenia gravis without (acute) exacerbation: Secondary | ICD-10-CM | POA: Diagnosis not present

## 2017-05-08 DIAGNOSIS — I495 Sick sinus syndrome: Secondary | ICD-10-CM | POA: Diagnosis not present

## 2017-05-08 DIAGNOSIS — I480 Paroxysmal atrial fibrillation: Secondary | ICD-10-CM | POA: Diagnosis not present

## 2017-05-08 DIAGNOSIS — E7849 Other hyperlipidemia: Secondary | ICD-10-CM | POA: Diagnosis not present

## 2017-05-08 DIAGNOSIS — E039 Hypothyroidism, unspecified: Secondary | ICD-10-CM | POA: Diagnosis not present

## 2017-05-08 DIAGNOSIS — E785 Hyperlipidemia, unspecified: Secondary | ICD-10-CM | POA: Diagnosis not present

## 2017-05-08 DIAGNOSIS — I119 Hypertensive heart disease without heart failure: Secondary | ICD-10-CM | POA: Diagnosis not present

## 2017-05-25 DIAGNOSIS — R339 Retention of urine, unspecified: Secondary | ICD-10-CM | POA: Diagnosis not present

## 2017-06-02 DIAGNOSIS — C61 Malignant neoplasm of prostate: Secondary | ICD-10-CM | POA: Diagnosis not present

## 2017-06-08 DIAGNOSIS — H2513 Age-related nuclear cataract, bilateral: Secondary | ICD-10-CM | POA: Diagnosis not present

## 2017-06-08 DIAGNOSIS — E119 Type 2 diabetes mellitus without complications: Secondary | ICD-10-CM | POA: Diagnosis not present

## 2017-06-09 DIAGNOSIS — R972 Elevated prostate specific antigen [PSA]: Secondary | ICD-10-CM | POA: Diagnosis not present

## 2017-06-13 DIAGNOSIS — E038 Other specified hypothyroidism: Secondary | ICD-10-CM | POA: Diagnosis not present

## 2017-06-13 DIAGNOSIS — R7301 Impaired fasting glucose: Secondary | ICD-10-CM | POA: Diagnosis not present

## 2017-06-13 DIAGNOSIS — E7849 Other hyperlipidemia: Secondary | ICD-10-CM | POA: Diagnosis not present

## 2017-06-13 DIAGNOSIS — R82998 Other abnormal findings in urine: Secondary | ICD-10-CM | POA: Diagnosis not present

## 2017-06-13 DIAGNOSIS — I10 Essential (primary) hypertension: Secondary | ICD-10-CM | POA: Diagnosis not present

## 2017-06-13 DIAGNOSIS — Z125 Encounter for screening for malignant neoplasm of prostate: Secondary | ICD-10-CM | POA: Diagnosis not present

## 2017-06-22 DIAGNOSIS — R339 Retention of urine, unspecified: Secondary | ICD-10-CM | POA: Diagnosis not present

## 2017-06-22 DIAGNOSIS — Z466 Encounter for fitting and adjustment of urinary device: Secondary | ICD-10-CM | POA: Diagnosis not present

## 2017-06-30 DIAGNOSIS — C61 Malignant neoplasm of prostate: Secondary | ICD-10-CM | POA: Diagnosis not present

## 2017-06-30 DIAGNOSIS — Z1389 Encounter for screening for other disorder: Secondary | ICD-10-CM | POA: Diagnosis not present

## 2017-06-30 DIAGNOSIS — E7849 Other hyperlipidemia: Secondary | ICD-10-CM | POA: Diagnosis not present

## 2017-06-30 DIAGNOSIS — Z9359 Other cystostomy status: Secondary | ICD-10-CM | POA: Diagnosis not present

## 2017-06-30 DIAGNOSIS — E038 Other specified hypothyroidism: Secondary | ICD-10-CM | POA: Diagnosis not present

## 2017-06-30 DIAGNOSIS — I480 Paroxysmal atrial fibrillation: Secondary | ICD-10-CM | POA: Diagnosis not present

## 2017-06-30 DIAGNOSIS — Z6826 Body mass index (BMI) 26.0-26.9, adult: Secondary | ICD-10-CM | POA: Diagnosis not present

## 2017-06-30 DIAGNOSIS — R7301 Impaired fasting glucose: Secondary | ICD-10-CM | POA: Diagnosis not present

## 2017-06-30 DIAGNOSIS — N183 Chronic kidney disease, stage 3 (moderate): Secondary | ICD-10-CM | POA: Diagnosis not present

## 2017-06-30 DIAGNOSIS — Z0000 Encounter for general adult medical examination without abnormal findings: Secondary | ICD-10-CM | POA: Diagnosis not present

## 2017-06-30 DIAGNOSIS — G4709 Other insomnia: Secondary | ICD-10-CM | POA: Diagnosis not present

## 2017-06-30 DIAGNOSIS — I10 Essential (primary) hypertension: Secondary | ICD-10-CM | POA: Diagnosis not present

## 2017-07-04 DIAGNOSIS — Z1212 Encounter for screening for malignant neoplasm of rectum: Secondary | ICD-10-CM | POA: Diagnosis not present

## 2017-07-06 DIAGNOSIS — R195 Other fecal abnormalities: Secondary | ICD-10-CM | POA: Diagnosis not present

## 2017-07-12 DIAGNOSIS — Z1212 Encounter for screening for malignant neoplasm of rectum: Secondary | ICD-10-CM | POA: Diagnosis not present

## 2017-07-17 DIAGNOSIS — Z85828 Personal history of other malignant neoplasm of skin: Secondary | ICD-10-CM | POA: Diagnosis not present

## 2017-07-17 DIAGNOSIS — L819 Disorder of pigmentation, unspecified: Secondary | ICD-10-CM | POA: Diagnosis not present

## 2017-07-17 DIAGNOSIS — L570 Actinic keratosis: Secondary | ICD-10-CM | POA: Diagnosis not present

## 2017-07-17 DIAGNOSIS — D229 Melanocytic nevi, unspecified: Secondary | ICD-10-CM | POA: Diagnosis not present

## 2017-07-17 DIAGNOSIS — L814 Other melanin hyperpigmentation: Secondary | ICD-10-CM | POA: Diagnosis not present

## 2017-07-17 DIAGNOSIS — L821 Other seborrheic keratosis: Secondary | ICD-10-CM | POA: Diagnosis not present

## 2017-07-24 DIAGNOSIS — Z466 Encounter for fitting and adjustment of urinary device: Secondary | ICD-10-CM | POA: Diagnosis not present

## 2017-07-24 DIAGNOSIS — R339 Retention of urine, unspecified: Secondary | ICD-10-CM | POA: Diagnosis not present

## 2017-08-24 DIAGNOSIS — C61 Malignant neoplasm of prostate: Secondary | ICD-10-CM | POA: Diagnosis not present

## 2017-08-25 DIAGNOSIS — Z466 Encounter for fitting and adjustment of urinary device: Secondary | ICD-10-CM | POA: Diagnosis not present

## 2017-08-25 DIAGNOSIS — R339 Retention of urine, unspecified: Secondary | ICD-10-CM | POA: Diagnosis not present

## 2017-09-04 DIAGNOSIS — N528 Other male erectile dysfunction: Secondary | ICD-10-CM | POA: Diagnosis not present

## 2017-09-04 DIAGNOSIS — Z9359 Other cystostomy status: Secondary | ICD-10-CM | POA: Diagnosis not present

## 2017-09-04 DIAGNOSIS — R339 Retention of urine, unspecified: Secondary | ICD-10-CM | POA: Diagnosis not present

## 2017-09-04 DIAGNOSIS — N320 Bladder-neck obstruction: Secondary | ICD-10-CM | POA: Diagnosis not present

## 2017-09-04 DIAGNOSIS — N35013 Post-traumatic anterior urethral stricture: Secondary | ICD-10-CM | POA: Diagnosis not present

## 2017-09-04 DIAGNOSIS — Z8546 Personal history of malignant neoplasm of prostate: Secondary | ICD-10-CM | POA: Diagnosis not present

## 2017-09-22 DIAGNOSIS — Z466 Encounter for fitting and adjustment of urinary device: Secondary | ICD-10-CM | POA: Diagnosis not present

## 2017-09-22 DIAGNOSIS — R339 Retention of urine, unspecified: Secondary | ICD-10-CM | POA: Diagnosis not present

## 2017-10-11 ENCOUNTER — Encounter: Payer: Self-pay | Admitting: Neurology

## 2017-10-11 ENCOUNTER — Ambulatory Visit (INDEPENDENT_AMBULATORY_CARE_PROVIDER_SITE_OTHER): Payer: Medicare Other | Admitting: Neurology

## 2017-10-11 ENCOUNTER — Other Ambulatory Visit: Payer: Self-pay | Admitting: *Deleted

## 2017-10-11 ENCOUNTER — Telehealth: Payer: Self-pay | Admitting: Neurology

## 2017-10-11 VITALS — BP 107/60 | HR 48 | Ht 70.25 in | Wt 191.8 lb

## 2017-10-11 DIAGNOSIS — G7000 Myasthenia gravis without (acute) exacerbation: Secondary | ICD-10-CM

## 2017-10-11 MED ORDER — AZATHIOPRINE 50 MG PO TABS: ORAL_TABLET | ORAL | 3 refills | Status: DC

## 2017-10-11 MED ORDER — AZATHIOPRINE 100 MG PO TABS: 100.0000 mg | ORAL_TABLET | Freq: Two times a day (BID) | ORAL | 4 refills | Status: DC

## 2017-10-11 NOTE — Progress Notes (Signed)
Chief Complaint  Patient presents with  . Myasthenia Gravis    He is here for his yearly follow up with no new concerns.  He has continued taking Imuran 50mg , two tablets twice daily.      PATIENT: Eugene Castaneda DOB: Sep 02, 1945  HISTORICAL( initial visit May 4th 2016)  Eugene Castaneda is here to follow-up for serum positive generalized myasthenia gravis. 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 Dagmar Hait in May 2016 for evaluation of double vision, he also has intermittent left ptosis, at its worst, he also has mild bulbar, neck flexion weakness, mild chewing difficulty, mild proximal upper extremity weakness. He never had significant swallowing, breathing difficulty, no gait difficulty  CT chest showed no evidence of thymoma May 2016 MRI of the brain in May 2016 showed mild atrophy His symptoms responded very well to Mestinon 60 mg 3 times a day, no significant side effect Prednisone was started since May 2016, up to 40 mg daily, he responded well, was able to tolerate tapering dose of prednisone  Imuran was started in May 21st 2016, 50 mg 2 tablets twice a day  He has urinary incontinence, planning on to have urethral reconstruction surgery by [facility] urologist Dr.Terlecki  Laboratory evaluation in June 2016 with normal CBC, worsening creatinine 1.64, mild elevation from baseline of 1.3, he has mild abnormal glucose, A1c was 6.1, Accu-Chek glucose level was 100-110 while he was treated with prednisone  UPDATE Sep 7th 2016: He only has mild occasionally double vision especially when looking to the far right side, no  chewing difficulty, no jaw fatigue, He is on Imuran 100 mg twice a day, prednisone 10 mg daily, mestinon 30mg  tid glucose level was mildly high average 113 in August, 2016  I reviewed [facility] record, laboratory September 15 2014, creatinine 1.22, glucose 122.   UPDATE Jan 07 2015: He had urethroplasty in 10/2013. artificial urethral sphincter placement in August 15th 2016, which works well for him, he denies bowel and bladder incontinence,  He has stopped prednisone since October fourth 2016, there was no worsening of his symptoms, he denies double vision, no longer require Mestinon,  He exercise regularly, but since end of September 2016, he noticed penis area paresthesia, burning sensation around the rim of the gland, sometimes the tip, getting worse with movement, sometimes woke up during sleep with stabbing pain, burning sensation  He denies low back pain, no gait difficulties  UPDATE April 07 2015: He had artificail urithoersphinctuer in Oct 2017, gabapentin did hot help his pelvic area pain, in March 2017, he developed urinary retention, was treated with antibiotic, eventually had suprapubic catheter, recent evaluation of cystoscope showed inflamed urethra tissues  He has been stable from myasthenia gravis standpoint, no double vision, no ptosis, no limb muscle weakness, he was not able to exercise regularly  UPDATE Sept 7th 2017: He had artifical urethral sphinter removed on April 1st 2017, he was treated with antibiotics, has lost appetited, lost 25 LB, he still has superbupic catheter, able to swim, he does exercise regularly  His MG is under good control, he is taking Imuran supposed to 200 mg daily, he might mistakenly taking 400 mg daily, no  longer require Mestinon,  I reviewed laboratory evaluation from his primary care Dr. Danna Hefty office   glucose level was 96, normal CMP, creatinine of 1.3, GFR 8 was 54.6, mildly decreased WBC 3.4, hemoglobin was 12 point 5, mild elevated  triglycerides 174, LDL 55, A1c 5.8, normal TSH, PSA was 0.069,  He is also seeing cardiologist [name], receiving Cardiac monitoring reported bradycardia episode, 25/m during sleep  UPDATE April 07 2016:  I reviewed primary care note from Dr.[name], he was found to have mild abnormal liver functional tests, highly suspected for recent initiation of atrovastatin, also complicated by Imuran use, wine intake, will have repeat laboratory evaluation on September twelfth 2017, if still elevated, he will hold atrovastatin for 4 weeks, and recheck, A1c was 5.6,   Repeat laboratory evaluation in January 2018 showed AST 62, ALT 49, LDL 76, cholesterol 139, he is on lower dose of atorvastatin now,  He is now taking Imuran 50 mg 2 tablets twice a day, at extreme gaze he has mild double vision, no problem reading, he has several episodes he has mild swallowing trouble, he feels like that the steak stuck behind his sternum, he has mild hoarseness,    He was treated with Tamiflu in Jan 2018 as preventive medication.  He swim one hour without difficulty.    UPDATE Sept 10 2018: He was seen by Dr. Radene Gunning on July 01 2016, lab evaluations showed CMP, glucose 110, creatinine 0.8, mild elevated AST 59, ALT 44, WBC was 3.79, hemoglobin of 14.2, normal thyroid functional tasks, LDL was 70, A1c 5.8.  He is taking Imuran 50 mg twice a day, doing very well, no significant side effect noticed, continue has mild double vision on extreme gaze, able to swim regularly  UPDATE Sept 11 2019: He is overall doing very well, there is no recurrent muscle weakness, when he drives, looking to extreme left or right, he had transient double vision, he denies swallowing difficulty, no limb muscle weakness, able to swim 1 hour without stopping.  Laboratory evaluations in May 2019, A1c was 5.7, normal CBC, creatinine of 0.9, hemoglobin of 14.5, normal TSH, 0.93, elevated PSA 6.98  REVIEW OF SYSTEMS: Full 14 system review of systems  performed and notable only for incontinence of bladder  ALLERGIES: No Known Allergies  HOME MEDICATIONS: Current Outpatient Prescriptions  Medication Sig Dispense Refill  . aspirin 81 MG tablet Take 81 mg by mouth daily.    . cholecalciferol (VITAMIN D) 1000 UNITS tablet Take 1,000 Units by mouth daily.    . fenofibrate 160 MG tablet Take 160 mg by mouth daily.    . Fish Oil OIL 1 tablet by Does not apply route daily.    . flecainide (TAMBOCOR) 50 MG tablet Take 50 mg by mouth 2 (two) times daily.    Marland Kitchen Leuprolide Acetate (LUPRON IJ) Inject as directed every 3 (three) months.    . levothyroxine (SYNTHROID, LEVOTHROID) 175 MCG tablet Take 175 mcg by mouth daily before breakfast.    . lisinopril (PRINIVIL,ZESTRIL) 20 MG tablet Take 20 mg by mouth daily.    . Multiple Vitamin (MULTIVITAMIN) tablet Take 1 tablet by mouth daily.    Marland Kitchen omeprazole (PRILOSEC) 20 MG capsule Take 20 mg by mouth daily.    . Pomegranate, Punica granatum, 150 MG CAPS Take 1 tablet by mouth daily.       PAST MEDICAL HISTORY: Past Medical History:  Diagnosis Date  . Atrial fibrillation ([place])   . Diplopia   . GERD (  gastroesophageal reflux disease)   . Hyperlipemia   . Hypertension   . Hypothyroidism   . Impaired fasting glucose   . MG (myasthenia gravis) ([place])   . Prostate cancer ([place])   . Urethral obstruction   . Urinary retention    Suprapubic Catheter placed 04/25/15    PAST SURGICAL HISTORY: Past Surgical History:  Procedure Laterality Date  . Artificial Urinary Sphincter    . catheter ablationfor afib    . COLONOSCOPY    . PROSTATECTOMY  2009  . TONSILLECTOMY      FAMILY HISTORY: Family History  Problem Relation Age of Onset  . Parkinson's disease Father   . Breast cancer Mother     SOCIAL HISTORY:  History   Social History  . Marital Status: Married    Spouse Name: N/A  . Number of Children: 2  . Years of Education: Masters   Occupational History  . Retired from Network engineer job     Social History Main Topics  . Smoking status: Former Smoker    Quit date: 02/01/1980  . Smokeless tobacco: Not on file  . Alcohol Use: 0.0 oz/week    0 Standard drinks or equivalent per week     Comment: 2 glasses red wine daily  . Drug Use: No  . Sexual Activity: Not on file   Other Topics Concern  . Not on file   Social History Narrative   Lives at home with spouse.   Right-handed.   2 cups caffeine daily.     PHYSICAL EXAM   Vitals:   10/11/17 0821  BP: 107/60  Pulse: (!) 48  Weight: 191 lb 12 oz (87 kg)  Height: 5' 10.25" (1.784 m)    Not recorded      Body mass index is 27.32 kg/m.  PHYSICAL EXAMNIATION:  Gen: NAD, conversant, well nourised, obese, well groomed                     Cardiovascular: Regular rate rhythm, no peripheral edema, warm, nontender. Eyes: Conjunctivae clear without exudates or hemorrhage Neck: Supple, no carotid bruise. Pulmonary: Clear to auscultation bilaterally   NEUROLOGICAL EXAM:  MENTAL STATUS: Speech:    Speech is normal; fluent and spontaneous with normal comprehension.  Cognition:    The patient is oriented to person, place, and time;     recent and remote memory intact;     language fluent;     normal attention, concentration,     fund of knowledge.  CRANIAL NERVES: CN II: Visual fields are full to confrontation. Pupils are 4 mm and briskly reactive to light.   CN III, IV, VI: extraocular movement are normal. I did not appreciate any ptosis today,  cover and uncover testing showed no significant extraocular muscle weakness CN V: Facial sensation is intact to pinprick in all 3 divisions bilaterally. Corneal responses are intact.  CN VII: Face is symmetric, he has mild bilateral eye closure and cheek puff weakness CN VIII: Hearing is normal to rubbing fingers CN IX, X: Palate elevates symmetrically. Phonation is normal. CN XI: Head turning and shoulder shrug are intact CN XII: Tongue is midline with normal  movements and no atrophy.  MOTOR: He has mild neck flexion, bilateral shoulder extension, abduction, external rotation weakness  REFLEXES: Reflexes are 2+ and symmetric at the biceps, triceps, knees, and ankles. Plantar responses are flexor.  SENSORY: Light touch, pinprick, position sense, and vibration sense are intact in fingers and toes.  COORDINATION: Rapid alternating  movements and fine finger movements are intact. There is no dysmetria on finger-to-nose and heel-knee-shin. There are no abnormal or extraneous movements.   GAIT/STANCE: Posture is normal. Gait is steady with normal steps, base, arm swing, and turning. Heel and toe walking are normal. Tandem gait is normal.  Romberg is absent.   DIAGNOSTIC DATA (LABS, IMAGING, TESTING) - I reviewed patient records, labs, notes, testing and imaging myself where available.  ASSESSMENT AND PLAN  Eugene Castaneda is a 72 y.o. male    Seropositive generalized myasthenia gravis,   keep Imuran 100 mg twice a day now  He still has mild  eye-closure cheek puff, mild proximal upper  extremity weakness  Prednisone was tapered off since October 2016, he had no recurrent weakness,   He exercise, swimming regularly without much difficulty  Previously abnormal liver functional test,  It was thought due to statin use, he is on very low dose of Crestor 2.5 milligrams daily, LDL was 70   Marcial Pacas, M.D. Ph.D.  [facility] [address] [place], [place] [address] Ph: [phone] Fax: [phone]

## 2017-10-11 NOTE — Telephone Encounter (Signed)
FYI Pt has called back to provide his Medicare #.  Pt was advised that a copy of the card would be needed.  He said that is fine but he was told to call and give card #[id number] No call back requested

## 2017-10-14 DIAGNOSIS — Z23 Encounter for immunization: Secondary | ICD-10-CM | POA: Diagnosis not present

## 2017-10-23 DIAGNOSIS — Z9359 Other cystostomy status: Secondary | ICD-10-CM | POA: Diagnosis not present

## 2017-10-23 DIAGNOSIS — Z466 Encounter for fitting and adjustment of urinary device: Secondary | ICD-10-CM | POA: Diagnosis not present

## 2017-11-13 ENCOUNTER — Encounter: Payer: Self-pay | Admitting: Cardiology

## 2017-11-13 DIAGNOSIS — I480 Paroxysmal atrial fibrillation: Secondary | ICD-10-CM

## 2017-11-13 DIAGNOSIS — I2510 Atherosclerotic heart disease of native coronary artery without angina pectoris: Secondary | ICD-10-CM

## 2017-11-13 DIAGNOSIS — I119 Hypertensive heart disease without heart failure: Secondary | ICD-10-CM

## 2017-11-13 DIAGNOSIS — N183 Chronic kidney disease, stage 3 unspecified: Secondary | ICD-10-CM | POA: Insufficient documentation

## 2017-11-13 DIAGNOSIS — E039 Hypothyroidism, unspecified: Secondary | ICD-10-CM | POA: Insufficient documentation

## 2017-11-13 DIAGNOSIS — C61 Malignant neoplasm of prostate: Secondary | ICD-10-CM | POA: Insufficient documentation

## 2017-11-13 DIAGNOSIS — Z7901 Long term (current) use of anticoagulants: Secondary | ICD-10-CM | POA: Insufficient documentation

## 2017-11-13 HISTORY — DX: Chronic kidney disease, stage 3 unspecified: N18.30

## 2017-11-13 HISTORY — DX: Hypertensive heart disease without heart failure: I11.9

## 2017-11-13 HISTORY — DX: Atherosclerotic heart disease of native coronary artery without angina pectoris: I25.10

## 2017-11-13 HISTORY — DX: Paroxysmal atrial fibrillation: I48.0

## 2017-11-13 NOTE — Progress Notes (Signed)
Date of visit:  05/08/2017 DOB:  1945-06-12    Age:  72 yrs. Medical record number:  [id number]     Account number:  [id number] Primary Care Provider: [name] R ____________________________ CURRENT DIAGNOSES  1. Paroxysmal atrial fibrillation  2. Hypertensive heart disease without heart failure  3. CAD Native without angina  4. Chronic kidney disease, stage 3 (moderate)  5. Essential (primary) hypertension  6. Sick sinus syndrome  7. Hypothyroidism  8. Hyperlipidemia  9. 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 Hyperlipidemia  11. Other long term (current) drug therapy  12. Personal history of malignant neoplasm of prostate ____________________________ ALLERGIES  No Known Drug Allergies ____________________________ MEDICATIONS  1. aspirin 81 mg chewable tablet, 1 p.o. daily  2. Azasan 100 mg tablet, BID  3. Fish Oil 1,000 mg Capsule, BID  4. flecainide 100 mg tablet, one prn for break through atrial fib  5. flecainide 50 mg tablet, BID  6. levothyroxine 150 mcg tablet, 1 p.o. daily  7. lisinopril 40 mg tablet, 1 p.o. daily  8. Lupron Depot 11.25 mg (3 month) intramuscular syringe kit, Take as directed PRN  9. multivitamin tablet, 1 p.o. daily  10. oxybutynin chloride ER 10 mg tablet,extended release 24 hr, QOD PRN  11. Prilosec OTC 20 mg tablet,delayed release, 1 p.o. daily  12. Renacidin 1980.6 mg-59.44m-980.4mg/30mL irrigation solution, Take as directed PRN  13. rosuvastatin 5 mg tablet, 1/2 tab daily  14. Vitamin D3 2,000 unit tablet, 1/2 tab daily ____________________________ HISTORY OF PRESENT ILLNESS Patient returns for cardiac followup. He continues with episodic palpitations. We have not been able to demonstrate recurrent atrial fibrillation. He continues to have issues related to his prostate cancer which has been recurrent although his PSA is remaining low. He denies PND, orthopnea, syncope, or claudication. ____________________________ PAST HISTORY   Past Medical Illnesses:  hypertension, hypothyroidism, prostate cancer treated with surgery with recurrence followed with radiation /lupron, myasthenia gravis;  Cardiovascular Illnesses:  bradycardia, sick sinus syndrome, atrial fibrillation, arrhythmia-PVCs, CAD as manifested by coronary calcification on CT scan;  Surgical Procedures:  tonsillectomy/adenoids, prostatectomy, urethroplasty, implantation of artificial sphincter;  NYHA Classification:  I;  Canadian Angina Classification:  Class 0: Asymptomatic;  Cardiology Procedures-Invasive:  RF ablation for atrial fibrillation February 2010;  Cardiology Procedures-Noninvasive:  treadmill April 2005, echocardiogram February 2005, treadmill cardiolite March 2010, treadmill cardiolite March 2015;  LVEF of 78% documented via nuclear study on 04/18/2013,  CHADS Score:  1,  CHA2DS2-VASC Score:  3 ____________________________ CARDIO-PULMONARY TEST DATES EKG Date:  05/08/2017;  Holter/Event Monitor Date: 12/19/2007;  Nuclear Study Date:  04/18/2013;  Echocardiography Date: 04/02/2008;  Chest Xray Date: 04/02/2008;   ____________________________ FAMILY HISTORY Brother -- Brother alive and well Brother -- Brother alive and well Father -- Father dead, Parkinsonism Mother -- Mother dead, Dementia/Alzheimers, Carcinoma of breast ____________________________ SOCIAL HISTORY Alcohol Use:  no alcohol use;  Smoking:  used to smoke but quit Prior to 1980;  Diet:  fat modified diet;  Lifestyle:  married;  Exercise:  exercises regularly;  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 Respiratory: denies dyspnea, cough, wheezing or hemoptysis. Cardiovascular:  please review HPI Abdominal: elevated liver enzmes Genitourinary-Male:  indwelling catheter  Musculoskeletal:  denies any history of arthritis, venous insufficiency, or muscle weakness. Neurological:  denies headaches,  stroke, or TIA  ____________________________ PHYSICAL EXAMINATION VITAL SIGNS  Blood Pressure:  128/62 Sitting, Right arm, regular cuff  , 134/70 Standing, Right arm and regular cuff   Pulse:  52/min. Weight:  192.00 lbs. Height:  71.00"BMI: 27  Constitutional:  pleasant white male in no acute distress Skin:  warm and dry to touch, no apparent skin lesions, or masses noted. Head:  normocephalic, balding male hair pattern Neck:  supple, without massess. No JVD, thyromegaly or carotid bruits. Carotid upstroke normal. Chest:  normal symmetry, clear to auscultation. Cardiac:  regular rhythm, normal S1 and S2, No S3 or S4, no murmurs, gallops or rubs detected. Genitalia Male:  catheter in place Extremities & Back:  no deformities, clubbing, cyanosis, erythema or edema observed. Normal muscle strength and tone. Neurological:  no gross motor or sensory deficits noted, affect appropriate, oriented x3. ____________________________ MOST RECENT LIPID PANEL 11/29/16  CHOL TOTL 125 mg/dl, LDL 66 NM, HDL 40 mg/dl, TRIGLYCER 96 mg/dl, ALT 27 u/l, ALK PHOS 64 u/l and AST 36 u/l ____________________________ IMPRESSIONS/PLAN  1. Paroxysmal atrial fibrillation with previous ablation with no recurrence 2. Overweight with need to lose weight 3. Hypertension blood pressure medicines increased recently  Recommendations:  Followup 6 months with EKG. Call if recurrent problems.  ____________________________ TODAYS ORDERS  1. 12 Lead EKG: Today  2. 12 Lead EKG: 6 months  3. Return Visit: 6 months                       ____________________________ Cardiology Physician:  Kerry Hough MD [facility]

## 2017-11-16 DIAGNOSIS — Z466 Encounter for fitting and adjustment of urinary device: Secondary | ICD-10-CM | POA: Diagnosis not present

## 2017-11-27 ENCOUNTER — Encounter: Payer: Self-pay | Admitting: Cardiology

## 2017-11-27 ENCOUNTER — Ambulatory Visit (INDEPENDENT_AMBULATORY_CARE_PROVIDER_SITE_OTHER): Payer: Medicare Other | Admitting: Cardiology

## 2017-11-27 VITALS — BP 149/70 | HR 51 | Resp 16 | Ht 70.0 in | Wt 193.0 lb

## 2017-11-27 DIAGNOSIS — I480 Paroxysmal atrial fibrillation: Secondary | ICD-10-CM

## 2017-11-27 DIAGNOSIS — E782 Mixed hyperlipidemia: Secondary | ICD-10-CM

## 2017-11-27 DIAGNOSIS — Z79899 Other long term (current) drug therapy: Secondary | ICD-10-CM

## 2017-11-27 DIAGNOSIS — I10 Essential (primary) hypertension: Secondary | ICD-10-CM | POA: Diagnosis not present

## 2017-11-27 DIAGNOSIS — I2510 Atherosclerotic heart disease of native coronary artery without angina pectoris: Secondary | ICD-10-CM

## 2017-11-27 MED ORDER — FLECAINIDE ACETATE 50 MG PO TABS: 50.0000 mg | ORAL_TABLET | Freq: Two times a day (BID) | ORAL | 3 refills | Status: DC

## 2017-11-27 NOTE — Patient Instructions (Signed)
Medication Instructions:  Your Physician recommend you continue on your current medication as directed.    If you need a refill on your cardiac medications before your next appointment, please call your pharmacy.   Lab work: None   Testing/Procedures: None  Follow-Up: At [facility], you and your health needs are our priority.  As part of our continuing mission to provide you with exceptional heart care, we have created designated Provider Care Teams.  These Care Teams include your primary Cardiologist (physician) and Advanced Practice Providers (APPs -  Physician Assistants and Nurse Practitioners) who all work together to provide you with the care you need, when you need it. You will need a follow up appointment in 6 months.  Please call our office 2 months in advance to schedule this appointment.  You may see Dr. Christopher or one of the following Advanced Practice Providers on your designated Care Team:   Rhonda Barrett, PA-C . Kathryn Lawrence, DNP, ANP  Any Other Special Instructions Will Be Listed Below (If Applicable).    

## 2017-11-27 NOTE — Progress Notes (Signed)
Cardiology Office Note:    Date:  11/27/2017   ID:  Eugene Castaneda, DOB [birth year], MRN [id number]  PCP:  [name], MD  Cardiologist:  Buford Dresser, MD PhD (patient of [name])  Referring MD: [name], MD   Chief Complaint  Patient presents with  . Atrial Fibrillation  . Coronary Artery Disease    History of Present Illness:    Eugene Castaneda is a 72 y.o. male with a hx of paroxysmal atrial fibrillation, hypertension, CAD without angina, sick sinus syndrome, hypothyroidism, hyperlipidemia who is seen as a new patient to [facility] (followed by [name]) at the request of [name], [name], MD for the evaluation and management of paroxysmal afib.   Overall feels well. No afib that he has noticed; has KardiaMobile device and has not seen any afib. Has history of sick sinus sydrome, able to swim 1.75 miles three times/week without issues. Some days has trouble getting heart rate going in the morning, but not affecting his daily activities. No syncope. No chest pain or shortness of breath. No LE edema. Weight has been stable to rising slightly (lost a lot of weight related to his prostate cancer). No PND or orthopnea.   Has been on flecainide for many years. Reports multiple treadmill stress tests in the past on flecainide that were normal, not sure of most recent test. Has discussed anticoagulation in the past with [name] and declined. He has a suprapubic catheter with intermittent hematuria, but he would also like to avoid stroke. Discussed this at length, summarized below.  Past Medical History:  Diagnosis Date  . CAD (coronary artery disease), native coronary artery 11/13/2017   Coronary calcification noted on CT scan   . Chronic kidney disease (CKD), stage III (moderate) ([place]) 11/13/2017  . Diplopia   . GERD (gastroesophageal reflux disease)   . Hyperlipemia   . Hypertension   . Hypertensive heart disease without CHF 11/13/2017  .  Hypothyroidism   . Impaired fasting glucose   . MG (myasthenia gravis) ([place])   . Paroxysmal atrial fibrillation ([place]) 11/13/2017   RF ablation Dr. [name] 2010 [facility] CHADSVASC 3  . Prostate cancer ([place])   . Urethral obstruction   . Urinary retention    Suprapubic Catheter placed 04/25/15    Past Surgical History:  Procedure Laterality Date  . Artificial Urinary Sphincter    . catheter ablationfor afib    . COLONOSCOPY    . PROSTATECTOMY  2009  . TONSILLECTOMY      Current Medications: Current Outpatient Medications on File Prior to Visit  Medication Sig  . aspirin 81 MG tablet Take 81 mg by mouth daily.  Marland Kitchen azaTHIOprine (IMURAN) 50 MG tablet Take 2 tablets twice daily.  . cholecalciferol (VITAMIN D) 1000 UNITS tablet Take 1,000 Units by mouth daily.  . Fish Oil OIL 1,200 mg by Does not apply route daily.   Marland Kitchen gluconic acid-citric acid (RENACIDIN) irrigation   . glucose blood test strip Use as instructed Acu-Chek Aviva Plus test strips  . Leuprolide Acetate (LUPRON IJ) Inject as directed as needed.   Marland Kitchen levothyroxine (SYNTHROID, LEVOTHROID) 150 MCG tablet Take 1 tablet by mouth daily.  Marland Kitchen lisinopril (PRINIVIL,ZESTRIL) 20 MG tablet Take 20 mg by mouth daily.  . Multiple Vitamin (MULTIVITAMIN) tablet Take 1 tablet by mouth daily.  Marland Kitchen omeprazole (PRILOSEC) 20 MG capsule Take 20 mg by mouth daily.  Marland Kitchen oxybutynin (DITROPAN-XL) 10 MG 24 hr tablet Take 10 mg by mouth as needed.  Marland Kitchen  rosuvastatin (CRESTOR) 5 MG tablet Take 2.5 mg by mouth daily.   No current facility-administered medications on file prior to visit.      Allergies:   Patient has no known allergies.   Social History   Socioeconomic History  . Marital status: Married    Spouse name: Not on file  . Number of children: 2  . Years of education: Masters  . Highest education level: Not on file  Occupational History  . Occupation: Retired  Scientific laboratory technician  . Financial resource strain: Not on file  . Food insecurity:      Worry: Not on file    Inability: Not on file  . Transportation needs:    Medical: Not on file    Non-medical: Not on file  Tobacco Use  . Smoking status: Former Smoker    Last attempt to quit: 02/01/1980    Years since quitting: 37.8  . Smokeless tobacco: Never Used  Substance and Sexual Activity  . Alcohol use: Yes    Alcohol/week: 0.0 standard drinks    Comment: 2 glasses red wine daily  . Drug use: No  . Sexual activity: Not on file  Lifestyle  . Physical activity:    Days per week: Not on file    Minutes per session: Not on file  . Stress: Not on file  Relationships  . Social connections:    Talks on phone: Not on file    Gets together: Not on file    Attends religious service: Not on file    Active member of club or organization: Not on file    Attends meetings of clubs or organizations: Not on file    Relationship status: Not on file  Other Topics Concern  . Not on file  Social History Narrative   Lives at home with spouse.   Right-handed.   2 cups caffeine daily.     Family History: The patient's family history includes Breast cancer in his mother; Parkinson's disease in his father.  ROS:   Please see the history of present illness.  Additional pertinent ROS:  Constitutional: Negative for chills, fever, night sweats, unintentional weight loss  HENT: Negative for ear pain and hearing loss.   Eyes: Negative for loss of vision and eye pain.  Respiratory: Negative for cough, sputum, shortness of breath, wheezing.   Cardiovascular: Negative for chest pain, palpitations , PND, orthopnea, lower extremity edema and claudication.  Gastrointestinal: Negative for abdominal pain, melena, and hematochezia.  Genitourinary: Chronic suprapubic catheter with occasional hematuria and infection. Musculoskeletal: Negative for falls and myalgias.  Skin: Negative for itching and rash.  Neurological: Negative for focal weakness, focal sensory changes and loss of consciousness.   Endo/Heme/Allergies: Does not bruise/bleed easily.    EKGs/Labs/Other Studies Reviewed:    The following studies were reviewed today: Per Dr. Thurman Coyer notes: treadmill myoview, 04/18/2013.  He did 9 minutes, up to stage II Bruce, for a workload total of 10 METs. Heart rate went from 62 bpm to 144 bpm. No significant ST changes or arrhythmia noted. Imaging suggests a low risk study, with inferolateral ischemia vs more likely artifact, EF 78%.  RF ablation 03/2008 Treadmill 05/2003, 03/2008, 03/2013 Echo 03/2003  EKG:  EKG is ordered today.  The ekg ordered today demonstrates sinus bradycardia with sinus arrhythmia, 1st degree AV block. IVCD.  Recent Labs: No results found for requested labs within last 8760 hours.  Recent Lipid Panel No results found for: CHOL, TRIG, HDL, CHOLHDL, VLDL, LDLCALC, LDLDIRECT  Labs from [place]: Lipids 05/2017: Tchol 131, LDL 71, HDL 36, TG 121 A1c 5.7 TSH 0.930 PSA 6.988  Physical Exam:    VS:  BP (!) 149/70   Pulse (!) 51   Resp 16   Ht 5\' 10"  (1.778 m)   Wt 193 lb (87.5 kg)   SpO2 99%   BMI 27.69 kg/m     Wt Readings from Last 3 Encounters:  11/27/17 193 lb (87.5 kg)  10/11/17 191 lb 12 oz (87 kg)  10/10/16 188 lb 4 oz (85.4 kg)     GEN: Well nourished, well developed in no acute distress HEENT: Normal NECK: No JVD; No carotid bruits LYMPHATICS: No lymphadenopathy CARDIAC: regular rhythm, normal S1 and S2, no murmurs, rubs, gallops. Radial and DP pulses 2+ bilaterally. RESPIRATORY:  Clear to auscultation without rales, wheezing or rhonchi  ABDOMEN: Soft, non-tender, non-distended MUSCULOSKELETAL:  No edema; No deformity  SKIN: Warm and dry NEUROLOGIC:  Alert and oriented x 3 PSYCHIATRIC:  Normal affect   ASSESSMENT:    1. Paroxysmal atrial fibrillation (HCC)   2. Essential hypertension   3. Mixed hyperlipidemia   4. Medication management    PLAN:    1. Paroxysmal atrial fibrillation:was on warfarin in the past (distant, >15 years  ago), had hematuria. Currently maintained in sinus rhythm on flecainide and has been for many years. Reports normal stress tests on flecainide, and I have requested records--updated to add that treadmill myoview results received, dated 04/18/2013. He did 9 minutes, up to stage II Bruce, for a workload total of 10 METs. Heart rate went from 62 bpm to 144 bpm. No significant ST changes or arrhythmia noted. Imaging suggests a low risk study, with inferolateral ischemia vs more likely artifact, EF 78%.  I am concerned about the long term use of flecainide and shared the contraindications with him. He has no known ischemia but has noted calcium on scans (therefore has the label of CAD). He also reports a long history of sick sinus syndrome.  I reviewed the contraindication with known ischemia, and I also reviewed the risk of bradycardia and relative contraindication with sick sinus syndrome. He denies pauses or syncope.   We also spoke about anticoagulation at length. He has had intermittent mild hematuria with his chronic indwelling suprapubic catheter. He has not been on anticoagulation for many years since having bleeding with warfarin. His CV=3. We discussed the options at length. He wants to avoid a stroke, but he is also nervous about anticoagulation. I would use a DOAC and not coumadin if he does elect to start anticoagulation.  For now, he wants to continue both flecainide and no anticoagulation.We are awaiting to hear if [name] will resume seeing patients again by his next visit. We have tentatively scheduled follow up with me in 6 mos, or he will see [name] if he has returned at that time. If he starts a DOAC I would stop the aspirin.  -CHA2DS2/VAS Stroke Risk Points= 3  >= 2 Points: High Risk     Points Metrics  0 Has Congestive Heart Failure:  No   1 Has Vascular Disease:  Yes   1 Has Hypertension:  Yes    1 Age:  29   0 Has Diabetes:  No   0 Had Stroke:  No  Had TIA:  No  Had  thromboembolism:  No   0 Male:  No     2. Hypertension: slightly elevated today, but was 128/62 at his prior  visit. Continue lisinopril. 3. Hyperlipidemia: LDL 71. Continue rosuvastatin 4. Noted asymptomatic CAD on imaging: low risk stress test in 2015, no chest pain. On aspirin and rosuvastatin. As noted above, as this was incidental and he is without symptoms, he has been treated with flecainide to this point. If any symptoms of ischemia, need to stop flecainide.  Plan for follow up: 6 mos with either me or [name].  TIME SPENT WITH PATIENT: 50 minutes of direct patient care. More than 50% of that time was spent on coordination of care and counseling regarding atrial fibrillation, medication management, risk of stroke/bleeding. There was complex decision making regarding risk and benefit of flecainide and anticoagulation.  Buford Dresser, MD, PhD [place]  [facility]   Medication Adjustments/Labs and Tests Ordered: Current medicines are reviewed at length with the patient today.  Concerns regarding medicines are outlined above.  Orders Placed This Encounter  Procedures  . EKG 12-Lead   Meds ordered this encounter  Medications  . flecainide (TAMBOCOR) 50 MG tablet    Sig: Take 1 tablet (50 mg total) by mouth 2 (two) times daily.    Dispense:  180 tablet    Refill:  3    Patient Instructions  Medication Instructions:  Your Physician recommend you continue on your current medication as directed.    If you need a refill on your cardiac medications before your next appointment, please call your pharmacy.   Lab work: None   Testing/Procedures: None  Follow-Up: At [facility], you and your health needs are our priority.  As part of our continuing mission to provide you with exceptional heart care, we have created designated Provider Care Teams.  These Care Teams include your primary Cardiologist (physician) and Advanced Practice Providers (APPs -   Physician Assistants and Nurse Practitioners) who all work together to provide you with the care you need, when you need it. You will need a follow up appointment in 6 months.  Please call our office 2 months in advance to schedule this appointment.  You may see Dr. Harrell Gave or one of the following Advanced Practice Providers on your designated Care Team:   Rosaria Ferries, PA-C . Jory Sims, DNP, ANP  Any Other Special Instructions Will Be Listed Below (If Applicable).       Signed, Buford Dresser, MD PhD 11/27/2017 12:38 PM    [place]

## 2017-12-04 DIAGNOSIS — C61 Malignant neoplasm of prostate: Secondary | ICD-10-CM | POA: Diagnosis not present

## 2017-12-14 DIAGNOSIS — Z466 Encounter for fitting and adjustment of urinary device: Secondary | ICD-10-CM | POA: Diagnosis not present

## 2017-12-14 DIAGNOSIS — Z9359 Other cystostomy status: Secondary | ICD-10-CM | POA: Diagnosis not present

## 2018-01-02 DIAGNOSIS — I10 Essential (primary) hypertension: Secondary | ICD-10-CM | POA: Diagnosis not present

## 2018-01-02 DIAGNOSIS — R7301 Impaired fasting glucose: Secondary | ICD-10-CM | POA: Diagnosis not present

## 2018-01-02 DIAGNOSIS — N183 Chronic kidney disease, stage 3 (moderate): Secondary | ICD-10-CM | POA: Diagnosis not present

## 2018-01-02 DIAGNOSIS — I480 Paroxysmal atrial fibrillation: Secondary | ICD-10-CM | POA: Diagnosis not present

## 2018-01-02 DIAGNOSIS — G7000 Myasthenia gravis without (acute) exacerbation: Secondary | ICD-10-CM | POA: Diagnosis not present

## 2018-01-02 DIAGNOSIS — E038 Other specified hypothyroidism: Secondary | ICD-10-CM | POA: Diagnosis not present

## 2018-01-02 DIAGNOSIS — Z9359 Other cystostomy status: Secondary | ICD-10-CM | POA: Diagnosis not present

## 2018-01-02 DIAGNOSIS — C61 Malignant neoplasm of prostate: Secondary | ICD-10-CM | POA: Diagnosis not present

## 2018-01-02 DIAGNOSIS — E7849 Other hyperlipidemia: Secondary | ICD-10-CM | POA: Diagnosis not present

## 2018-01-02 DIAGNOSIS — Z6827 Body mass index (BMI) 27.0-27.9, adult: Secondary | ICD-10-CM | POA: Diagnosis not present

## 2018-01-12 DIAGNOSIS — Z466 Encounter for fitting and adjustment of urinary device: Secondary | ICD-10-CM | POA: Diagnosis not present

## 2018-01-12 DIAGNOSIS — R339 Retention of urine, unspecified: Secondary | ICD-10-CM | POA: Diagnosis not present

## 2018-01-16 DIAGNOSIS — L821 Other seborrheic keratosis: Secondary | ICD-10-CM | POA: Diagnosis not present

## 2018-01-16 DIAGNOSIS — Z85828 Personal history of other malignant neoplasm of skin: Secondary | ICD-10-CM | POA: Diagnosis not present

## 2018-01-16 DIAGNOSIS — D229 Melanocytic nevi, unspecified: Secondary | ICD-10-CM | POA: Diagnosis not present

## 2018-01-16 DIAGNOSIS — L814 Other melanin hyperpigmentation: Secondary | ICD-10-CM | POA: Diagnosis not present

## 2018-01-16 DIAGNOSIS — L819 Disorder of pigmentation, unspecified: Secondary | ICD-10-CM | POA: Diagnosis not present

## 2018-02-09 DIAGNOSIS — Z466 Encounter for fitting and adjustment of urinary device: Secondary | ICD-10-CM | POA: Diagnosis not present

## 2018-02-09 DIAGNOSIS — R339 Retention of urine, unspecified: Secondary | ICD-10-CM | POA: Diagnosis not present

## 2018-03-05 DIAGNOSIS — C61 Malignant neoplasm of prostate: Secondary | ICD-10-CM | POA: Diagnosis not present

## 2018-03-09 DIAGNOSIS — Z466 Encounter for fitting and adjustment of urinary device: Secondary | ICD-10-CM | POA: Diagnosis not present

## 2018-03-09 DIAGNOSIS — R339 Retention of urine, unspecified: Secondary | ICD-10-CM | POA: Diagnosis not present

## 2018-03-12 DIAGNOSIS — N183 Chronic kidney disease, stage 3 (moderate): Secondary | ICD-10-CM | POA: Diagnosis not present

## 2018-03-12 DIAGNOSIS — N35013 Post-traumatic anterior urethral stricture: Secondary | ICD-10-CM | POA: Diagnosis not present

## 2018-03-12 DIAGNOSIS — Z8546 Personal history of malignant neoplasm of prostate: Secondary | ICD-10-CM | POA: Diagnosis not present

## 2018-03-12 DIAGNOSIS — R339 Retention of urine, unspecified: Secondary | ICD-10-CM | POA: Diagnosis not present

## 2018-03-12 DIAGNOSIS — N528 Other male erectile dysfunction: Secondary | ICD-10-CM | POA: Diagnosis not present

## 2018-03-12 DIAGNOSIS — Z9359 Other cystostomy status: Secondary | ICD-10-CM | POA: Diagnosis not present

## 2018-04-06 DIAGNOSIS — R339 Retention of urine, unspecified: Secondary | ICD-10-CM | POA: Diagnosis not present

## 2018-04-06 DIAGNOSIS — Z466 Encounter for fitting and adjustment of urinary device: Secondary | ICD-10-CM | POA: Diagnosis not present

## 2018-05-08 DIAGNOSIS — Z466 Encounter for fitting and adjustment of urinary device: Secondary | ICD-10-CM | POA: Diagnosis not present

## 2018-05-08 DIAGNOSIS — R339 Retention of urine, unspecified: Secondary | ICD-10-CM | POA: Diagnosis not present

## 2018-05-31 ENCOUNTER — Telehealth: Payer: Self-pay | Admitting: Cardiology

## 2018-05-31 ENCOUNTER — Telehealth: Payer: Self-pay

## 2018-05-31 DIAGNOSIS — Z466 Encounter for fitting and adjustment of urinary device: Secondary | ICD-10-CM | POA: Diagnosis not present

## 2018-05-31 DIAGNOSIS — C61 Malignant neoplasm of prostate: Secondary | ICD-10-CM | POA: Diagnosis not present

## 2018-05-31 DIAGNOSIS — R339 Retention of urine, unspecified: Secondary | ICD-10-CM | POA: Diagnosis not present

## 2018-05-31 NOTE — Telephone Encounter (Signed)

## 2018-06-01 NOTE — Telephone Encounter (Signed)
Smartphone/consent/ my chart/ pre reg completed °

## 2018-06-04 ENCOUNTER — Encounter: Payer: Self-pay | Admitting: Cardiology

## 2018-06-04 ENCOUNTER — Telehealth (INDEPENDENT_AMBULATORY_CARE_PROVIDER_SITE_OTHER): Payer: Medicare Other | Admitting: Cardiology

## 2018-06-04 VITALS — BP 125/79 | HR 55 | Ht 70.0 in | Wt 192.0 lb

## 2018-06-04 DIAGNOSIS — Z7189 Other specified counseling: Secondary | ICD-10-CM

## 2018-06-04 DIAGNOSIS — I2510 Atherosclerotic heart disease of native coronary artery without angina pectoris: Secondary | ICD-10-CM | POA: Diagnosis not present

## 2018-06-04 DIAGNOSIS — I480 Paroxysmal atrial fibrillation: Secondary | ICD-10-CM | POA: Diagnosis not present

## 2018-06-04 NOTE — Patient Instructions (Signed)

## 2018-06-04 NOTE — Progress Notes (Signed)
Virtual Visit via Video Note   This visit type was conducted due to national recommendations for restrictions regarding the COVID-19 Pandemic (e.g. social distancing) in an effort to limit this patient's exposure and mitigate transmission in our community.  Due to his co-morbid illnesses, this patient is at least at moderate risk for complications without adequate follow up.  This format is felt to be most appropriate for this patient at this time.  All issues noted in this document were discussed and addressed.  A limited physical exam was performed with this format.  Please refer to the patient's chart for his consent to telehealth for [facility].   Date:  06/04/2018   ID:  Eugene Castaneda, DOB [birth year], MRN [id number]  Patient Location: Home Provider Location: Home  PCP:  [name], MD  Cardiologist:  Buford Dresser, MD Fredricka Bonine [name] Electrophysiologist:  None   Evaluation Performed:  Follow-Up Visit  Chief Complaint:  Follow up  History of Present Illness:    Eugene Castaneda is a 73 y.o. male with paroxysmal atrial fibrillation, HTN, CAD without angina, sick sinus syndrome, hypothyroidism, hyperlipidemia.  At our visit on 11/27/2017, we reviewed both flecainide and anticoagulation risk/benefit.   The patient does not have symptoms concerning for COVID-19 infection (fever, chills, cough, or new shortness of breath).   Echo 10/12/2015 Mild cLVH with normal WM. EF 55% Moderate LAE  Mild RAE Trace AR Mild MR Trace TR and PR IVC dilated, <50% RC  Overall he is doing very well. Has not seen any afib on his KardiaMobile. Used to swim daily, but with social distancing this has been impacted.   Denies chest pain, shortness of breath at rest or with normal exertion. No PND, orthopnea, LE edema or unexpected weight gain. No syncope or palpitations.  We again reviewed warning for flecainide with CAD. Despite calcium on imaging (CAD equivalent), he has not  had ischemia on stress testing. Discussed alternatives, including dofetilide and amiodarone. He wishes to continue with flecainide and understands the risks.    Past Medical History:  Diagnosis Date   CAD (coronary artery disease), native coronary artery 11/13/2017   Coronary calcification noted on CT scan    Chronic kidney disease (CKD), stage III (moderate) ([place]) 11/13/2017   Diplopia    GERD (gastroesophageal reflux disease)    Hyperlipemia    Hypertension    Hypertensive heart disease without CHF 11/13/2017   Hypothyroidism    Impaired fasting glucose    MG (myasthenia gravis) ([place])    Paroxysmal atrial fibrillation ([place]) 11/13/2017   RF ablation Dr. [name] 2010 [facility] CHADSVASC 3   Prostate cancer [facility])    Urethral obstruction    Urinary retention    Suprapubic Catheter placed 04/25/15   Past Surgical History:  Procedure Laterality Date   Artificial Urinary Sphincter     catheter ablationfor afib     COLONOSCOPY     PROSTATECTOMY  2009   TONSILLECTOMY       Current Meds  Medication Sig   aspirin 81 MG tablet Take 81 mg by mouth daily.   azaTHIOprine (IMURAN) 50 MG tablet Take 2 tablets twice daily.   cholecalciferol (VITAMIN D) 1000 UNITS tablet Take 1,000 Units by mouth daily.   Fish Oil OIL 1,200 mg by Does not apply route daily.    flecainide (TAMBOCOR) 50 MG tablet Take 1 tablet (50 mg total) by mouth 2 (two) times daily.   gluconic acid-citric acid (RENACIDIN) irrigation  Leuprolide Acetate (LUPRON IJ) Inject as directed as needed.    levothyroxine (SYNTHROID, LEVOTHROID) 150 MCG tablet Take 1 tablet by mouth daily.   lisinopril (PRINIVIL,ZESTRIL) 20 MG tablet Take 20 mg by mouth 2 (two) times a day.    Multiple Vitamin (MULTIVITAMIN) tablet Take 1 tablet by mouth daily.   omeprazole (PRILOSEC) 20 MG capsule Take 20 mg by mouth every other day.    oxybutynin (DITROPAN-XL) 10 MG 24 hr tablet Take 10 mg by mouth as needed.     rosuvastatin (CRESTOR) 5 MG tablet Take 2.5 mg by mouth daily.   [DISCONTINUED] glucose blood test strip Use as instructed Acu-Chek Aviva Plus test strips     Allergies:   Patient has no known allergies.   Social History   Tobacco Use   Smoking status: Former Smoker    Last attempt to quit: 02/01/1980    Years since quitting: 38.3   Smokeless tobacco: Never Used  Substance Use Topics   Alcohol use: Yes    Alcohol/week: 0.0 standard drinks    Comment: 2 glasses red wine daily   Drug use: No     Family Hx: The patient's family history includes Breast cancer in his mother; Parkinson's disease in his father.  ROS:   Please see the history of present illness.    Constitutional: Negative for chills, fever, night sweats, unintentional weight loss  HENT: Negative for ear pain and hearing loss.   Eyes: Negative for loss of vision and eye pain.  Respiratory: Negative for cough, sputum, wheezing.   Cardiovascular: See HPI. Gastrointestinal: Negative for abdominal pain, melena, and hematochezia.  Genitourinary: Negative for dysuria and hematuria.  Musculoskeletal: Negative for falls and myalgias.  Skin: Negative for itching and rash.  Neurological: Negative for focal weakness, focal sensory changes and loss of consciousness.  Endo/Heme/Allergies: Does not bruise/bleed easily.  All other systems reviewed and are negative.   Prior CV studies:   The following studies were reviewed today: treadmill myoview, 04/18/2013.  He did 9 minutes, up to stage II Bruce, for a workload total of 10 METs. Heart rate went from 62 bpm to 144 bpm. No significant ST changes or arrhythmia noted. Imaging suggests a low risk study, with inferolateral ischemia vs more likely artifact, EF 78%.  RF ablation 03/2008 Treadmill 05/2003, 03/2008, 03/2013 Echo 03/2003  Labs/Other Tests and Data Reviewed:    EKG:  An ECG dated 11/27/17 was personally reviewed today and demonstrated:  sinus bradycardia, sinus  arrhythmia, 1st degree AV block, IVCD  Recent Labs: No results found for requested labs within last 8760 hours.   Recent Lipid Panel No results found for: CHOL, TRIG, HDL, CHOLHDL, LDLCALC, LDLDIRECT  Wt Readings from Last 3 Encounters:  06/04/18 192 lb (87.1 kg)  11/27/17 193 lb (87.5 kg)  10/11/17 191 lb 12 oz (87 kg)     Objective:    Vital Signs:  BP 125/79    Pulse (!) 55    Ht 5\' 10"  (1.778 m)    Wt 192 lb (87.1 kg)    BMI 27.55 kg/m    VITAL SIGNS:  reviewed GEN:  no acute distress EYES:  sclerae anicteric, EOMI - Extraocular Movements Intact RESPIRATORY:  normal respiratory effort, symmetric expansion CARDIOVASCULAR:  no peripheral edema SKIN:  no rash, lesions or ulcers. MUSCULOSKELETAL:  no obvious deformities. NEURO:  alert and oriented x 3, no obvious focal deficit PSYCH:  normal affect  ASSESSMENT & PLAN:    Paroxysmal atrial fibrillation: has been  in sinus rhythm based on his KardiaMobile device. -CHA2DS2/VAS Stroke Risk Points=3. Not on anticoagulation given prior hematuria, have discussed at length previously (see prior note). Does not wish to be on anticoagulation. -has maintained sinus rhythm on flecainide for many years. See my initial note with him on this on 11/27/17. He did not have any ischemia on stress test in 2015, but he has coronary calcium (CAD equivalent) on imaging. Again today we discussed alternatives, but he would like to continue flecainide for now. We discussed that if he develops any symptoms of ischemia, he needs to alert me ASAP so that we can stop the flecainide. Given that it has been 5 years since his last treadmill, it would be reasonable to repeat this at our follow up.   Hypertension: well controlled today, continue lisinopril 20 mg BID  Hyperlipidemia: LDL 71, continue rosuvastatin 5 mg  Noted coronary calcium on imaging, with low risk stress test 2105, asymptomatic: Continue aspirin and rosuvastatin.     COVID-19 Education: The  signs and symptoms of COVID-19 were discussed with the patient and how to seek care for testing (follow up with PCP or arrange E-visit).  The importance of social distancing was discussed today.  Time:   Today, I have spent 37 minutes with the patient with telehealth technology discussing the above problems.    Patient Instructions  Medication Instructions:  Your Physician recommend you continue on your current medication as directed.    If you need a refill on your cardiac medications before your next appointment, please call your pharmacy.   Lab work: None  Testing/Procedures: None  Follow-Up: At [facility], you and your health needs are our priority.  As part of our continuing mission to provide you with exceptional heart care, we have created designated Provider Care Teams.  These Care Teams include your primary Cardiologist (physician) and Advanced Practice Providers (APPs -  Physician Assistants and Nurse Practitioners) who all work together to provide you with the care you need, when you need it. You will need a follow up appointment in 6 months.  Please call our office 2 months in advance to schedule this appointment.  You may see Buford Dresser, MD or one of the following Advanced Practice Providers on your designated Care Team:   Rosaria Ferries, PA-C  Jory Sims, DNP, ANP       Medication Adjustments/Labs and Tests Ordered: Current medicines are reviewed at length with the patient today.  Concerns regarding medicines are outlined above.   Tests Ordered: No orders of the defined types were placed in this encounter.   Medication Changes: No orders of the defined types were placed in this encounter.   Disposition:  Follow up 6 mos  Signed, Buford Dresser, MD  06/04/2018 9:22 AM    [facility]

## 2018-06-22 DIAGNOSIS — E119 Type 2 diabetes mellitus without complications: Secondary | ICD-10-CM | POA: Diagnosis not present

## 2018-06-22 DIAGNOSIS — H2513 Age-related nuclear cataract, bilateral: Secondary | ICD-10-CM | POA: Diagnosis not present

## 2018-06-29 DIAGNOSIS — R339 Retention of urine, unspecified: Secondary | ICD-10-CM | POA: Diagnosis not present

## 2018-06-29 DIAGNOSIS — R7301 Impaired fasting glucose: Secondary | ICD-10-CM | POA: Diagnosis not present

## 2018-06-29 DIAGNOSIS — Z466 Encounter for fitting and adjustment of urinary device: Secondary | ICD-10-CM | POA: Diagnosis not present

## 2018-06-29 DIAGNOSIS — I10 Essential (primary) hypertension: Secondary | ICD-10-CM | POA: Diagnosis not present

## 2018-06-29 DIAGNOSIS — E7849 Other hyperlipidemia: Secondary | ICD-10-CM | POA: Diagnosis not present

## 2018-06-29 DIAGNOSIS — Z125 Encounter for screening for malignant neoplasm of prostate: Secondary | ICD-10-CM | POA: Diagnosis not present

## 2018-06-29 DIAGNOSIS — E038 Other specified hypothyroidism: Secondary | ICD-10-CM | POA: Diagnosis not present

## 2018-07-02 DIAGNOSIS — I10 Essential (primary) hypertension: Secondary | ICD-10-CM | POA: Diagnosis not present

## 2018-07-02 DIAGNOSIS — R82998 Other abnormal findings in urine: Secondary | ICD-10-CM | POA: Diagnosis not present

## 2018-07-06 ENCOUNTER — Encounter: Payer: Self-pay | Admitting: Internal Medicine

## 2018-07-06 DIAGNOSIS — Z0000 Encounter for general adult medical examination without abnormal findings: Secondary | ICD-10-CM | POA: Diagnosis not present

## 2018-07-06 DIAGNOSIS — I10 Essential (primary) hypertension: Secondary | ICD-10-CM | POA: Diagnosis not present

## 2018-07-06 DIAGNOSIS — I480 Paroxysmal atrial fibrillation: Secondary | ICD-10-CM | POA: Diagnosis not present

## 2018-07-06 DIAGNOSIS — R7301 Impaired fasting glucose: Secondary | ICD-10-CM | POA: Diagnosis not present

## 2018-07-06 DIAGNOSIS — C61 Malignant neoplasm of prostate: Secondary | ICD-10-CM | POA: Diagnosis not present

## 2018-07-06 DIAGNOSIS — E039 Hypothyroidism, unspecified: Secondary | ICD-10-CM | POA: Diagnosis not present

## 2018-07-06 DIAGNOSIS — E785 Hyperlipidemia, unspecified: Secondary | ICD-10-CM | POA: Diagnosis not present

## 2018-07-06 DIAGNOSIS — Z9359 Other cystostomy status: Secondary | ICD-10-CM | POA: Diagnosis not present

## 2018-07-06 DIAGNOSIS — Z1331 Encounter for screening for depression: Secondary | ICD-10-CM | POA: Diagnosis not present

## 2018-07-06 DIAGNOSIS — K219 Gastro-esophageal reflux disease without esophagitis: Secondary | ICD-10-CM | POA: Diagnosis not present

## 2018-07-06 DIAGNOSIS — G7000 Myasthenia gravis without (acute) exacerbation: Secondary | ICD-10-CM | POA: Diagnosis not present

## 2018-07-27 DIAGNOSIS — R339 Retention of urine, unspecified: Secondary | ICD-10-CM | POA: Diagnosis not present

## 2018-08-01 ENCOUNTER — Other Ambulatory Visit: Payer: Self-pay

## 2018-08-01 ENCOUNTER — Ambulatory Visit: Payer: Medicare Other

## 2018-08-01 VITALS — Ht 70.5 in | Wt 192.0 lb

## 2018-08-01 DIAGNOSIS — Z86010 Personal history of colonic polyps: Secondary | ICD-10-CM

## 2018-08-01 MED ORDER — SUPREP BOWEL PREP KIT 17.5-3.13-1.6 GM/177ML PO SOLN: 1.0000 | Freq: Once | ORAL | 0 refills | Status: AC

## 2018-08-01 NOTE — Progress Notes (Signed)
Per pt, no allergies to soy or egg products.Pt not taking any weight loss meds or using  O2 at home.  Pt denies sedation problems. Pt refused emmi video.  The PV was done over the phone due to COVID-19. Verified pt's address and insurance. Reviewed prep instructions and medical hx with pt and will mail instructions to pt today.Informed pt to call with any questions or changes prior to his procedure. Pt understood.

## 2018-08-08 ENCOUNTER — Telehealth: Payer: Self-pay | Admitting: Internal Medicine

## 2018-08-08 NOTE — Telephone Encounter (Signed)

## 2018-08-08 NOTE — Telephone Encounter (Signed)
Patient called back and answered not to all questions.

## 2018-08-09 ENCOUNTER — Ambulatory Visit (AMBULATORY_SURGERY_CENTER): Payer: Medicare Other | Admitting: Internal Medicine

## 2018-08-09 ENCOUNTER — Encounter: Payer: Self-pay | Admitting: Internal Medicine

## 2018-08-09 ENCOUNTER — Other Ambulatory Visit: Payer: Self-pay

## 2018-08-09 VITALS — BP 130/63 | HR 100 | Temp 97.6°F | Resp 11 | Ht 70.0 in | Wt 193.0 lb

## 2018-08-09 DIAGNOSIS — I129 Hypertensive chronic kidney disease with stage 1 through stage 4 chronic kidney disease, or unspecified chronic kidney disease: Secondary | ICD-10-CM | POA: Diagnosis not present

## 2018-08-09 DIAGNOSIS — D122 Benign neoplasm of ascending colon: Secondary | ICD-10-CM

## 2018-08-09 DIAGNOSIS — K219 Gastro-esophageal reflux disease without esophagitis: Secondary | ICD-10-CM | POA: Diagnosis not present

## 2018-08-09 DIAGNOSIS — N183 Chronic kidney disease, stage 3 (moderate): Secondary | ICD-10-CM | POA: Diagnosis not present

## 2018-08-09 DIAGNOSIS — D125 Benign neoplasm of sigmoid colon: Secondary | ICD-10-CM | POA: Diagnosis not present

## 2018-08-09 DIAGNOSIS — Z86010 Personal history of colonic polyps: Secondary | ICD-10-CM | POA: Diagnosis not present

## 2018-08-09 DIAGNOSIS — D128 Benign neoplasm of rectum: Secondary | ICD-10-CM

## 2018-08-09 DIAGNOSIS — Z860101 Personal history of adenomatous and serrated colon polyps: Secondary | ICD-10-CM

## 2018-08-09 DIAGNOSIS — D129 Benign neoplasm of anus and anal canal: Secondary | ICD-10-CM

## 2018-08-09 MED ORDER — SODIUM CHLORIDE 0.9 % IV SOLN: 500.0000 mL | Freq: Once | INTRAVENOUS | Status: DC

## 2018-08-09 NOTE — Op Note (Signed)
[name] Procedure Date: 08/09/2018 8:55 AM MRN: [id number] Endoscopist: Docia Chuck. Henrene Pastor , MD Age: 73 Referring MD:  Date of Birth: Apr 22, 1945 Gender: Male Account #: [id number] Procedure:                Colonoscopy with cold snare polypectomy x5 Indications:              High risk colon cancer surveillance: Personal                            history of multiple (3 or more) adenomas. Previous                            examinations elsewhere ("polyps") then here June                            2015 (3 tubular adenomas) Medicines:                Monitored Anesthesia Care Procedure:                Pre-Anesthesia Assessment:                           - Prior to the procedure, a History and Physical                            was performed, and patient medications and                            allergies were reviewed. The patient's tolerance of                            previous anesthesia was also reviewed. The risks                            and benefits of the procedure and the sedation                            options and risks were discussed with the patient.                            All questions were answered, and informed consent                            was obtained. Prior Anticoagulants: The patient has                            taken no previous anticoagulant or antiplatelet                            agents. ASA Grade Assessment: II - A patient with                            mild systemic disease. After reviewing the risks  and benefits, the patient was deemed in                            satisfactory condition to undergo the procedure.                           After obtaining informed consent, the colonoscope                            was passed under direct vision. Throughout the                            procedure, the patient's blood pressure, pulse, and                            oxygen  saturations were monitored continuously. The                            Colonoscope was introduced through the anus and                            advanced to the the cecum, identified by                            appendiceal orifice and ileocecal valve. The                            ileocecal valve, appendiceal orifice, and rectum                            were photographed. The quality of the bowel                            preparation was good. The colonoscopy was performed                            without difficulty. The patient tolerated the                            procedure well. The bowel preparation used was                            SUPREP via split dose instruction. Scope In: 9:03:59 AM Scope Out: 9:19:46 AM Scope Withdrawal Time: 0 hours 12 minutes 15 seconds  Total Procedure Duration: 0 hours 15 minutes 47 seconds  Findings:                 Five polyps were found in the rectum, sigmoid colon                            and ascending colon. The polyps were 1 to 4 mm in                            size. These polyps were removed with  a cold snare.                            Resection and retrieval were complete.                           Multiple small and large-mouthed diverticula were                            found in the left colon.                           Internal hemorrhoids were found during                            retroflexion. The hemorrhoids were small.                           The exam was otherwise without abnormality on                            direct and retroflexion views. Complications:            No immediate complications. Estimated blood loss:                            None. Estimated Blood Loss:     Estimated blood loss: none. Impression:               - Five 1 to 4 mm polyps in the rectum, in the                            sigmoid colon and in the ascending colon, removed                            with a cold snare. Resected and  retrieved.                           - Diverticulosis in the left colon.                           - Internal hemorrhoids.                           - The examination was otherwise normal on direct                            and retroflexion views. Recommendation:           - Repeat colonoscopy in 5 years for surveillance.                           - Patient has a contact number available for                            emergencies. The signs and symptoms of potential  delayed complications were discussed with the                            patient. Return to normal activities tomorrow.                            Written discharge instructions were provided to the                            patient.                           - Resume previous diet.                           - Continue present medications.                           - Await pathology results. Docia Chuck. Henrene Pastor, MD 08/09/2018 9:26:36 AM This report has been signed electronically.

## 2018-08-09 NOTE — Progress Notes (Signed)
Called to room to assist during endoscopic procedure.  Patient ID and intended procedure confirmed with present staff. Received instructions for my participation in the procedure from the performing physician.  

## 2018-08-09 NOTE — Progress Notes (Signed)
Report to PACU, RN, vss, BBS= Clear.  

## 2018-08-09 NOTE — Patient Instructions (Addendum)
Handouts Provided:  Polyps and Diverticulosis   YOU HAD AN ENDOSCOPIC PROCEDURE TODAY AT [place]:   Refer to the procedure report that was given to you for any specific questions about what was found during the examination.  If the procedure report does not answer your questions, please call your gastroenterologist to clarify.  If you requested that your care partner not be given the details of your procedure findings, then the procedure report has been included in a sealed envelope for you to review at your convenience later.  YOU SHOULD EXPECT: Some feelings of bloating in the abdomen. Passage of more gas than usual.  Walking can help get rid of the air that was put into your GI tract during the procedure and reduce the bloating. If you had a lower endoscopy (such as a colonoscopy or flexible sigmoidoscopy) you may notice spotting of blood in your stool or on the toilet paper. If you underwent a bowel prep for your procedure, you may not have a normal bowel movement for a few days.  Please Note:  You might notice some irritation and congestion in your nose or some drainage.  This is from the oxygen used during your procedure.  There is no need for concern and it should clear up in a day or so.  SYMPTOMS TO REPORT IMMEDIATELY:   Following lower endoscopy (colonoscopy or flexible sigmoidoscopy):  Excessive amounts of blood in the stool  Significant tenderness or worsening of abdominal pains  Swelling of the abdomen that is new, acute  Fever of 100F or higher  For urgent or emergent issues, a gastroenterologist can be reached at any hour by calling [phone].   DIET:  We do recommend a small meal at first, but then you may proceed to your regular diet.  Drink plenty of fluids but you should avoid alcoholic beverages for 24 hours.  ACTIVITY:  You should plan to take it easy for the rest of today and you should NOT DRIVE or use heavy machinery until tomorrow (because of  the sedation medicines used during the test).    FOLLOW UP: Our staff will call the number listed on your records 48-72 hours following your procedure to check on you and address any questions or concerns that you may have regarding the information given to you following your procedure. If we do not reach you, we will leave a message.  We will attempt to reach you two times.  During this call, we will ask if you have developed any symptoms of COVID 19. If you develop any symptoms (ie: fever, flu-like symptoms, shortness of breath, cough etc.) before then, please call [phone].  If you test positive for Covid 19 in the 2 weeks post procedure, please call and report this information to [place].    If any biopsies were taken you will be contacted by phone or by letter within the next 1-3 weeks.  Please call us at [phone] if you have not heard about the biopsies in 3 weeks.    SIGNATURES/CONFIDENTIALITY: You and/or your care partner have signed paperwork which will be entered into your electronic medical record.  These signatures attest to the fact that that the information above on your After Visit Summary has been reviewed and is understood.  Full responsibility of the confidentiality of this discharge information lies with you and/or your care-partner.

## 2018-08-13 ENCOUNTER — Telehealth: Payer: Self-pay

## 2018-08-13 ENCOUNTER — Encounter: Payer: Self-pay | Admitting: Internal Medicine

## 2018-08-13 NOTE — Telephone Encounter (Signed)
  Follow up Call-  Call back number 08/09/2018  Post procedure Call Back phone  # [phone]  Permission to leave phone message Yes  Some recent data might be hidden     Patient questions:  Do you have a fever, pain , or abdominal swelling? No. Pain Score  0 *  Have you tolerated food without any problems? Yes.    Have you been able to return to your normal activities? Yes.    Do you have any questions about your discharge instructions: Diet   No. Medications  No. Follow up visit  No.  Do you have questions or concerns about your Care? No.  Actions: * If pain score is 4 or above: No action needed, pain <4.  1. Have you developed a fever since your procedure? no  2.   Have you had an respiratory symptoms (SOB or cough) since your procedure? no  3.   Have you tested positive for COVID 19 since your procedure no  4.   Have you had any family members/close contacts diagnosed with the COVID 19 since your procedure?  no   If yes to any of these questions please route to Joylene John, RN and Alphonsa Gin, Therapist, sports.

## 2018-08-23 DIAGNOSIS — R339 Retention of urine, unspecified: Secondary | ICD-10-CM | POA: Diagnosis not present

## 2018-08-23 DIAGNOSIS — Z466 Encounter for fitting and adjustment of urinary device: Secondary | ICD-10-CM | POA: Diagnosis not present

## 2018-09-03 DIAGNOSIS — C61 Malignant neoplasm of prostate: Secondary | ICD-10-CM | POA: Diagnosis not present

## 2018-09-10 DIAGNOSIS — Z9359 Other cystostomy status: Secondary | ICD-10-CM | POA: Diagnosis not present

## 2018-09-10 DIAGNOSIS — N35013 Post-traumatic anterior urethral stricture: Secondary | ICD-10-CM | POA: Diagnosis not present

## 2018-09-10 DIAGNOSIS — N528 Other male erectile dysfunction: Secondary | ICD-10-CM | POA: Diagnosis not present

## 2018-09-10 DIAGNOSIS — R339 Retention of urine, unspecified: Secondary | ICD-10-CM | POA: Diagnosis not present

## 2018-09-10 DIAGNOSIS — R9721 Rising PSA following treatment for malignant neoplasm of prostate: Secondary | ICD-10-CM | POA: Diagnosis not present

## 2018-09-11 DIAGNOSIS — E038 Other specified hypothyroidism: Secondary | ICD-10-CM | POA: Diagnosis not present

## 2018-10-13 DIAGNOSIS — Z23 Encounter for immunization: Secondary | ICD-10-CM | POA: Diagnosis not present

## 2018-10-18 DIAGNOSIS — R338 Other retention of urine: Secondary | ICD-10-CM | POA: Diagnosis not present

## 2018-10-22 ENCOUNTER — Ambulatory Visit (INDEPENDENT_AMBULATORY_CARE_PROVIDER_SITE_OTHER): Payer: Medicare Other | Admitting: Adult Health

## 2018-10-22 ENCOUNTER — Encounter: Payer: Self-pay | Admitting: Adult Health

## 2018-10-22 ENCOUNTER — Other Ambulatory Visit: Payer: Self-pay

## 2018-10-22 VITALS — BP 150/69 | HR 48 | Temp 95.7°F | Ht 70.0 in | Wt 199.4 lb

## 2018-10-22 DIAGNOSIS — I2510 Atherosclerotic heart disease of native coronary artery without angina pectoris: Secondary | ICD-10-CM | POA: Diagnosis not present

## 2018-10-22 DIAGNOSIS — R251 Tremor, unspecified: Secondary | ICD-10-CM | POA: Diagnosis not present

## 2018-10-22 DIAGNOSIS — G7000 Myasthenia gravis without (acute) exacerbation: Secondary | ICD-10-CM | POA: Diagnosis not present

## 2018-10-22 MED ORDER — AZATHIOPRINE 50 MG PO TABS: ORAL_TABLET | ORAL | 3 refills | Status: DC

## 2018-10-22 NOTE — Patient Instructions (Signed)
Your Plan:  Continue Imuran  If your symptoms worsen or you develop new symptoms please let us know.       Thank you for coming to see us at [facility]. I hope we have been able to provide you high quality care today.  You may receive a patient satisfaction survey over the next few weeks. We would appreciate your feedback and comments so that we may continue to improve ourselves and the health of our patients.  

## 2018-10-22 NOTE — Progress Notes (Signed)
PATIENT: Eugene Castaneda DOB: 03-25-1945  REASON FOR VISIT: follow up HISTORY FROM: patient  HISTORY OF PRESENT ILLNESS: Today 10/22/18:  Eugene Castaneda is a 73 year old male with a history of myasthenia gravis.  He returns today for follow-up.  He remains on Imuran.  Denies any new issues.  He had blood work with his primary care in May.  I have reviewed the blood work and it was relatively unremarkable.  The patient denies any muscle weakness in the extremities.  Denies any changes with his vision or ptosis.  He does report that if he looks to the extreme right or left he may have diplopia.  He also reports a tremor in the hands.  He primarily notices it sometimes with his handwriting or with eating.  Reports that his father had Parkinson's disease.  He returns today for an evaluation.  HISTORY (copied from Eugene Castaneda): UPDATE Sept 11 2019: He is overall doing very well, there is no recurrent muscle weakness, when he drives, looking to extreme left or right, he had transient double vision, he denies swallowing difficulty, no limb muscle weakness, able to swim 1 hour without stopping.  Laboratory evaluations in May 2019, A1c was 5.7, normal CBC, creatinine of 0.9, hemoglobin of 14.5, normal TSH, 0.93, elevated PSA 6.98  REVIEW OF SYSTEMS: Out of a complete 14 system review of symptoms, the patient complains only of the following symptoms, and all other reviewed systems are negative.  Incontinence of bladder, frequent waking  ALLERGIES: No Known Allergies  HOME MEDICATIONS: Outpatient Medications Prior to Visit  Medication Sig Dispense Refill  . aspirin 81 MG tablet Take 81 mg by mouth daily.    Marland Kitchen azaTHIOprine (IMURAN) 50 MG tablet Take 2 tablets twice daily. 360 tablet 3  . cholecalciferol (VITAMIN D) 1000 UNITS tablet Take 1,000 Units by mouth daily.    . Fish Oil OIL 1,000 mg by Does not apply route daily.     . flecainide (TAMBOCOR) 50 MG tablet Take 1 tablet (50 mg total) by mouth  2 (two) times daily. 180 tablet 3  . gluconic acid-citric acid (RENACIDIN) irrigation     . Leuprolide Acetate (LUPRON IJ) Inject as directed as needed.     Marland Kitchen levothyroxine (SYNTHROID, LEVOTHROID) 150 MCG tablet Take 1 tablet by mouth daily.    Marland Kitchen lisinopril (PRINIVIL,ZESTRIL) 20 MG tablet Take 20 mg by mouth 2 (two) times a day.     . Multiple Vitamin (MULTIVITAMIN) tablet Take 1 tablet by mouth daily.    Marland Kitchen omeprazole (PRILOSEC) 20 MG capsule Take 20 mg by mouth daily.     Marland Kitchen oxybutynin (DITROPAN-XL) 10 MG 24 hr tablet Take 10 mg by mouth as needed.    . rosuvastatin (CRESTOR) 5 MG tablet Take 2.5 mg by mouth daily.    Marland Kitchen levothyroxine (EUTHYROX) 150 MCG tablet Take 150 mcg by mouth daily before breakfast.     No facility-administered medications prior to visit.     PAST MEDICAL HISTORY: Past Medical History:  Diagnosis Date  . CAD (coronary artery disease), native coronary artery 11/13/2017   Coronary calcification noted on CT scan   . Chronic kidney disease (CKD), stage III (moderate) ([place]) 11/13/2017  . Diplopia   . Foley catheter present    pt has supra-pubic foley catheter in place/since 2016  . GERD (gastroesophageal reflux disease)   . Hyperlipemia   . Hypertension   . Hypertensive heart disease without CHF 11/13/2017  . Hypothyroidism   . Impaired  fasting glucose   . MG (myasthenia gravis) ([place])   . Paroxysmal atrial fibrillation ([place]) 11/13/2017   RF ablation Dr. [name] 2010 [facility] CHADSVASC 3  . Prostate cancer ([place]) 2009  . Urethral obstruction   . Urinary retention    Suprapubic Catheter placed 04/25/15    PAST SURGICAL HISTORY: Past Surgical History:  Procedure Laterality Date  . 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 catheter ablationfor afib  2010  . COLONOSCOPY    . PROSTATECTOMY  2009  . TONSILLECTOMY      FAMILY HISTORY: Family History  Problem  Relation Age of Onset  . Parkinson's disease Father   . Breast cancer Mother     SOCIAL HISTORY: Social History   Socioeconomic History  . Marital status: Married    Spouse name: Not on file  . Number of children: 2  . Years of education: Masters  . Highest education level: Not on file  Occupational History  . Occupation: Retired  Scientific laboratory technician  . Financial resource strain: Not on file  . Food insecurity    Worry: Not on file    Inability: Not on file  . Transportation needs    Medical: Not on file    Non-medical: Not on file  Tobacco Use  . Smoking status: Former Smoker    Quit date: 02/01/1980    Years since quitting: 38.7  . Smokeless tobacco: Never Used  Substance and Sexual Activity  . Alcohol use: Yes    Alcohol/week: 0.0 standard drinks    Comment: 2 glasses red wine daily  . Drug use: No  . Sexual activity: Not on file  Lifestyle  . Physical activity    Days per week: Not on file    Minutes per session: Not on file  . Stress: Not on file  Relationships  . Social Herbalist on phone: Not on file    Gets together: Not on file    Attends religious service: Not on file    Active member of club or organization: Not on file    Attends meetings of clubs or organizations: Not on file    Relationship status: Not on file  . Intimate partner violence    Fear of current or ex partner: Not on file    Emotionally abused: Not on file    Physically abused: Not on file    Forced sexual activity: Not on file  Other Topics Concern  . Not on file  Social History Narrative   Lives at home with spouse.   Right-handed.   2 cups caffeine daily.      PHYSICAL EXAM  Vitals:   10/22/18 0840  BP: (!) 150/69  Pulse: (!) 48  Temp: (!) 95.7 F (35.4 C)  TempSrc: Oral  Weight: 199 lb 6.4 oz (90.4 kg)  Height: 5\' 10"  (1.778 m)   Body mass index is 28.61 kg/m.  Generalized: Well developed, in no acute distress   Neurological examination  Mentation: Alert  oriented to time, place, history taking. Follows all commands speech and language fluent Cranial nerve II-XII: Pupils were equal round reactive to light. Extraocular movements were full, visual field were full on confrontational test.Head turning and shoulder shrug  were normal and symmetric. Motor: The motor testing reveals 5 over 5 strength of all 4 extremities. Good symmetric motor tone is noted throughout.  Sensory: Sensory testing is  intact to soft touch on all 4 extremities. No evidence of extinction is noted.  Coordination: Cerebellar testing reveals good finger-nose-finger and heel-to-shin bilaterally.  Tremor noted in the upper extremities with finger-nose-finger. Gait and station: Gait is normal.  Good arm swing.  Good turns. Reflexes: Deep tendon reflexes are symmetric and normal bilaterally.   DIAGNOSTIC DATA (LABS, IMAGING, TESTING) - I reviewed patient records, labs, notes, testing and imaging myself where available.  Lab Results  Component Value Date   WBC 3.2 (L) 10/08/2015   HGB 14.4 10/08/2015   HCT 42.7 10/08/2015   MCV 96 10/08/2015   PLT 279 10/08/2015      Component Value Date/Time   NA 142 10/08/2015 1034   K 5.4 (H) 10/08/2015 1034   CL 102 10/08/2015 1034   CO2 23 10/08/2015 1034   GLUCOSE 100 (H) 10/08/2015 1034   GLUCOSE 89 04/02/2008 1215   BUN 13 10/08/2015 1034   CREATININE 0.85 10/08/2015 1034   CALCIUM 9.7 10/08/2015 1034   PROT 6.6 10/08/2015 1034   ALBUMIN 4.4 10/08/2015 1034   AST 92 (H) 10/08/2015 1034   ALT 71 (H) 10/08/2015 1034   ALKPHOS 62 10/08/2015 1034   BILITOT 1.0 10/08/2015 1034   GFRNONAA 88 10/08/2015 1034   GFRAA 102 10/08/2015 1034   No results found for: CHOL, HDL, LDLCALC, LDLDIRECT, TRIG, CHOLHDL Lab Results  Component Value Date   HGBA1C 6.2 (H) 04/07/2015   No results found for: VITAMINB12 No results found for: TSH    ASSESSMENT AND PLAN 73 y.o. year old male  has a past medical history of CAD (coronary artery  disease), native coronary artery (11/13/2017), Chronic kidney disease (CKD), stage III (moderate) ([facility]) (11/13/2017), Diplopia, Foley catheter present, GERD (gastroesophageal reflux disease), Hyperlipemia, Hypertension, Hypertensive heart disease without CHF (11/13/2017), Hypothyroidism, Impaired fasting glucose, MG (myasthenia gravis) ([place]), Paroxysmal atrial fibrillation ([place]) (11/13/2017), Prostate cancer ([place]) (2009), Urethral obstruction, and Urinary retention. here with:  1.  Myasthenia gravis 2.  Tremor  Overall the patient has done well.  He will continue on Imuran.  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  We will continue to monitor symptoms given that he has a family history of Parkinson's disease.  He is advised that if his symptoms worsen or he develops new symptoms he should let us know.  He will follow-up in 6 months or sooner if needed.    Ward Givens, MSN, NP-C 10/22/2018, 9:06 AM [facility] [address] [place], [place] [address] [phone]

## 2018-10-23 NOTE — Progress Notes (Signed)
I have reviewed and agreed above plan. 

## 2018-11-19 ENCOUNTER — Other Ambulatory Visit: Payer: Self-pay | Admitting: Cardiology

## 2018-11-19 DIAGNOSIS — I480 Paroxysmal atrial fibrillation: Secondary | ICD-10-CM

## 2018-11-20 NOTE — Telephone Encounter (Signed)
Rx request sent to pharmacy.  

## 2018-12-06 DIAGNOSIS — C61 Malignant neoplasm of prostate: Secondary | ICD-10-CM | POA: Diagnosis not present

## 2018-12-07 ENCOUNTER — Telehealth: Payer: Self-pay

## 2018-12-07 NOTE — Telephone Encounter (Signed)
Called pt to reschedule as provider will not be in the office that day. Left message to call back.

## 2018-12-10 NOTE — Telephone Encounter (Signed)
Appointment rescheduled for 11/24.

## 2018-12-20 ENCOUNTER — Ambulatory Visit: Payer: Medicare Other | Admitting: Cardiology

## 2018-12-25 ENCOUNTER — Ambulatory Visit (INDEPENDENT_AMBULATORY_CARE_PROVIDER_SITE_OTHER): Payer: Medicare Other | Admitting: Cardiology

## 2018-12-25 ENCOUNTER — Other Ambulatory Visit: Payer: Self-pay

## 2018-12-25 VITALS — BP 142/71 | HR 50 | Ht 70.0 in | Wt 198.2 lb

## 2018-12-25 DIAGNOSIS — I10 Essential (primary) hypertension: Secondary | ICD-10-CM

## 2018-12-25 DIAGNOSIS — Z7189 Other specified counseling: Secondary | ICD-10-CM | POA: Diagnosis not present

## 2018-12-25 DIAGNOSIS — I480 Paroxysmal atrial fibrillation: Secondary | ICD-10-CM | POA: Diagnosis not present

## 2018-12-25 DIAGNOSIS — E785 Hyperlipidemia, unspecified: Secondary | ICD-10-CM

## 2018-12-25 DIAGNOSIS — I2510 Atherosclerotic heart disease of native coronary artery without angina pectoris: Secondary | ICD-10-CM | POA: Diagnosis not present

## 2018-12-25 MED ORDER — APIXABAN 5 MG PO TABS: 5.0000 mg | ORAL_TABLET | Freq: Two times a day (BID) | ORAL | 3 refills | Status: DC

## 2018-12-25 NOTE — Patient Instructions (Addendum)
Medication Instructions:  Stop: Aspirin 81 mg daily Start: Eliquis 5 mg two times a day  *If you need a refill on your cardiac medications before your next appointment, please call your pharmacy*  Lab Work: None  Testing/Procedures: None  Follow-Up: At [facility], you and your health needs are our priority.  As part of our continuing mission to provide you with exceptional heart care, we have created designated Provider Care Teams.  These Care Teams include your primary Cardiologist (physician) and Advanced Practice Providers (APPs -  Physician Assistants and Nurse Practitioners) who all work together to provide you with the care you need, when you need it.  Your next appointment:   6 month(s)  The format for your next appointment:   In Person  Provider:   Buford Dresser, MD     Apixaban oral tablets What is this medicine? APIXABAN (a PIX a ban) is an anticoagulant (blood thinner). It is used to lower the chance of stroke in people with a medical condition called atrial fibrillation. It is also used to treat or prevent blood clots in the lungs or in the veins. This medicine may be used for other purposes; ask your health care provider or pharmacist if you have questions. COMMON BRAND NAME(S): Eliquis What should I tell my health care provider before I take this medicine? They need to know if you have any of these conditions:  antiphospholipid antibody syndrome  bleeding disorders  bleeding in the brain  blood in your stools (black or tarry stools) or if you have blood in your vomit  history of blood clots  history of stomach bleeding  kidney disease  liver disease  mechanical heart valve  an unusual or allergic reaction to apixaban, other medicines, foods, dyes, or preservatives  pregnant or trying to get pregnant  breast-feeding How should I use this medicine? Take this medicine by mouth with a glass of water. Follow the directions on the  prescription label. You can take it with or without food. If it upsets your stomach, take it with food. Take your medicine at regular intervals. Do not take it more often than directed. Do not stop taking except on your doctor's advice. Stopping this medicine may increase your risk of a blood clot. Be sure to refill your prescription before you run out of medicine. Talk to your pediatrician regarding the use of this medicine in children. Special care may be needed. Overdosage: If you think you have taken too much of this medicine contact a poison control center or emergency room at once. NOTE: This medicine is only for you. Do not share this medicine with others. What if I miss a dose? If you miss a dose, take it as soon as you can. If it is almost time for your next dose, take only that dose. Do not take double or extra doses. What may interact with this medicine? This medicine may interact with the following:  aspirin and aspirin-like medicines  certain medicines for fungal infections like ketoconazole and itraconazole  certain medicines for seizures like carbamazepine and phenytoin  certain medicines that treat or prevent blood clots like warfarin, enoxaparin, and dalteparin  clarithromycin  NSAIDs, medicines for pain and inflammation, like ibuprofen or naproxen  rifampin  ritonavir  St. John's wort This list may not describe all possible interactions. Give your health care provider a list of all the medicines, herbs, non-prescription drugs, or dietary supplements you use. Also tell them if you smoke, drink alcohol, or use  illegal drugs. Some items may interact with your medicine. What should I watch for while using this medicine? Visit your healthcare professional for regular checks on your progress. You may need blood work done while you are taking this medicine. Your condition will be monitored carefully while you are receiving this medicine. It is important not to miss any  appointments. Avoid sports and activities that might cause injury while you are using this medicine. Severe falls or injuries can cause unseen bleeding. Be careful when using sharp tools or knives. Consider using an Copy. Take special care brushing or flossing your teeth. Report any injuries, bruising, or red spots on the skin to your healthcare professional. If you are going to need surgery or other procedure, tell your healthcare professional that you are taking this medicine. Wear a medical ID bracelet or chain. Carry a card that describes your disease and details of your medicine and dosage times. What side effects may I notice from receiving this medicine? Side effects that you should report to your doctor or health care professional as soon as possible:  allergic reactions like skin rash, itching or hives, swelling of the face, lips, or tongue  signs and symptoms of bleeding such as bloody or black, tarry stools; red or dark-brown urine; spitting up blood or brown material that looks like coffee grounds; red spots on the skin; unusual bruising or bleeding from the eye, gums, or nose  signs and symptoms of a blood clot such as chest pain; shortness of breath; pain, swelling, or warmth in the leg  signs and symptoms of a stroke such as changes in vision; confusion; trouble speaking or understanding; severe headaches; sudden numbness or weakness of the face, arm or leg; trouble walking; dizziness; loss of coordination This list may not describe all possible side effects. Call your doctor for medical advice about side effects. You may report side effects to FDA at 1-800-FDA-1088. Where should I keep my medicine? Keep out of the reach of children. Store at room temperature between 20 and 25 degrees C (68 and 77 degrees F). Throw away any unused medicine after the expiration date. NOTE: This sheet is a summary. It may not cover all possible information. If you have questions about this  medicine, talk to your doctor, pharmacist, or health care provider.  2020 Elsevier/Gold Standard (2017-09-27 17:39:34)

## 2018-12-25 NOTE — Progress Notes (Signed)
Cardiology Office Note:    Date:  12/25/2018   ID:  Eugene Castaneda, DOB 09-Apr-1945, MRN [id number]  PCP:  [name], MD  Cardiologist:  Buford Dresser, MD (former patient of [name])  Referring MD: [name], MD   CC: follow up, concern for afib on his device  History of Present Illness:    Eugene Castaneda is a 73 y.o. male with a hx of paroxysmal atrial fibrillation, hypertension, CAD without angina, sick sinus syndrome, hypothyroidism, hyperlipidemia who is seen for follow up. I initially saw him 11/27/2017  as a new patient to [facility] (previously followed by [name]) at the request of [name], [name], MD for the evaluation and management of paroxysmal afib.   Cardiac history: paroxysmal afib, on flecainide "for many years," reports prior treadmill tests that I do not have access to. Has declined anticoagulation. Uses [employer] intermittently to evaluate for afib.  Today: [employer] strips with him. Having more symptoms, fluttering is off and on, sometimes several times per day. No associated symptoms beyond fluttering. Most have some artifact but QRS marches like sinus rhythm. There is an event 09/06/18 that appears to be likely afib. The baseline is difficult to follow due to artifact, but there are no clear p waves, and the QRS interval is irregular.   Has resting bradycardia, rates around 50 usually, asymptomatic. Overall, he brings about 30 pages of [employer] strips, which we reviewed together. Majority of strips have normal interval, difficult to see p waves on most strips, but only the portion on 09/06/18 appears to have irregular intervals. Rate is still well controlled even if it is afib.  Had recent lifeline screening, no results but told that the day of looked ok. Last had done 2 years ago.   We discussed anticoagulation at length today. He had preferred to remain off of it in the past. We reviewed that aspirin does not  prevent stroke due to afib. Discussed options for anticoagulation, risk/benefit. See below.  Denies chest pain, shortness of breath at rest or with normal exertion. No PND, orthopnea, LE edema or unexpected weight gain. No syncope.  Past Medical History:  Diagnosis Date  . CAD (coronary artery disease), native coronary artery 11/13/2017   Coronary calcification noted on CT scan   . Chronic kidney disease (CKD), stage III (moderate) ([place]) 11/13/2017  . Diplopia   . Foley catheter present    pt has supra-pubic foley catheter in place/since 2016  . GERD (gastroesophageal reflux disease)   . Hyperlipemia   . Hypertension   . Hypertensive heart disease without CHF 11/13/2017  . Hypothyroidism   . Impaired fasting glucose   . MG (myasthenia gravis) ([place])   . Paroxysmal atrial fibrillation ([place]) 11/13/2017   RF ablation Dr. [name] 2010 [facility] CHADSVASC 3  . Prostate cancer ([place]) 2009  . Urethral obstruction   . Urinary retention    Suprapubic Catheter placed 04/25/15    Past Surgical History:  Procedure Laterality Date  . 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 catheter ablationfor afib  2010  . COLONOSCOPY    . PROSTATECTOMY  2009  . TONSILLECTOMY      Current Medications: Current Outpatient Medications on File Prior to Visit  Medication Sig  . aspirin 81 MG tablet Take 81 mg by mouth daily.  Marland Kitchen azaTHIOprine (IMURAN) 50 MG tablet Take 2 tablets twice daily.  Marland Kitchen  cholecalciferol (VITAMIN D) 1000 UNITS tablet Take 1,000 Units by mouth daily.  . Fish Oil OIL 1,000 mg by Does not apply route daily.   . flecainide (TAMBOCOR) 50 MG tablet Take 1 tablet (50 mg total) by mouth 2 (two) times daily.  Marland Kitchen gluconic acid-citric acid (RENACIDIN) irrigation   . Leuprolide Acetate (LUPRON IJ) Inject as directed as needed.   Marland Kitchen levothyroxine (SYNTHROID, LEVOTHROID) 150 MCG tablet Take 1 tablet  by mouth daily.  Marland Kitchen lisinopril (PRINIVIL,ZESTRIL) 20 MG tablet Take 20 mg by mouth 2 (two) times a day.   . Multiple Vitamin (MULTIVITAMIN) tablet Take 1 tablet by mouth daily.  Marland Kitchen omeprazole (PRILOSEC) 20 MG capsule Take 20 mg by mouth daily.   Marland Kitchen oxybutynin (DITROPAN-XL) 10 MG 24 hr tablet Take 10 mg by mouth as needed.  . rosuvastatin (CRESTOR) 5 MG tablet Take 2.5 mg by mouth daily.   No current facility-administered medications on file prior to visit.      Allergies:   Patient has no known allergies.   Social History   Tobacco Use  . Smoking status: Former Smoker    Quit date: 02/01/1980    Years since quitting: 38.9  . Smokeless tobacco: Never Used  Substance Use Topics  . Alcohol use: Yes    Alcohol/week: 0.0 standard drinks    Comment: 2 glasses red wine daily  . Drug use: No    Family History: The patient's family history includes Breast cancer in his mother; Parkinson's disease in his father.  ROS:   Please see the history of present illness.  Additional pertinent ROS: Constitutional: Negative for chills, fever, night sweats, unintentional weight loss  HENT: Negative for ear pain and hearing loss.   Eyes: Negative for loss of vision and eye pain.  Respiratory: Negative for cough, sputum, wheezing.   Cardiovascular: See HPI. Gastrointestinal: Negative for abdominal pain, melena, and hematochezia.  Genitourinary: Chronic suprapubic catheter, no recent hematuria  Musculoskeletal: Negative for falls and myalgias.  Skin: Negative for itching and rash.  Neurological: Negative for focal weakness, focal sensory changes and loss of consciousness.  Endo/Heme/Allergies: Does not bruise/bleed easily.   EKGs/Labs/Other Studies Reviewed:    The following studies were reviewed today: Per Dr. Thurman Coyer notes: treadmill myoview, 04/18/2013.  He did 9 minutes, up to stage II [place], for a workload total of 10 METs. Heart rate went from 62 bpm to 144 bpm. No significant ST changes or  arrhythmia noted. Imaging suggests a low risk study, with inferolateral ischemia vs more likely artifact, EF 78%.  Echo 10/12/2015 Mild cLVH with normal WM. EF 55% Moderate LAE  Mild RAE Trace AR Mild MR Trace TR and PR IVC dilated, <50% RC  RF ablation 03/2008 Treadmill 05/2003, 03/2008, 03/2013 Echo 03/2003  EKG:  EKG is ordered today.  The ekg ordered today demonstrates sinus bradycardia, 1st degree AV block, incomplete RBBB at rate of 50 bpm.  Recent Labs: No results found for requested labs within last 8760 hours.  Recent Lipid Panel No results found for: CHOL, TRIG, HDL, CHOLHDL, VLDL, LDLCALC, LDLDIRECT  Labs from [facility]: Lipids 06/2018: Tchol 120, LDL 63, HDL 37, TG 102 A1c 5.8  Physical Exam:    VS:  BP (!) 142/71   Pulse (!) 50   Ht 5\' 10"  (1.778 m)   Wt 198 lb 3.2 oz (89.9 kg)   SpO2 100%   BMI 28.44 kg/m     Wt Readings from Last 3 Encounters:  10/22/18 199 lb 6.4 oz (  90.4 kg)  08/09/18 193 lb (87.5 kg)  08/01/18 192 lb (87.1 kg)    GEN: Well nourished, well developed in no acute distress HEENT: Normal, moist mucous membranes NECK: No JVD CARDIAC: regular rhythm, normal S1 and S2, no rubs or gallops. No murmur. VASCULAR: Radial and DP pulses 2+ bilaterally. No carotid bruits RESPIRATORY:  Clear to auscultation without rales, wheezing or rhonchi  ABDOMEN: Soft, non-tender, non-distended MUSCULOSKELETAL:  Ambulates independently SKIN: Warm and dry, no edema NEUROLOGIC:  Alert and oriented x 3. No focal neuro deficits noted. PSYCHIATRIC:  Normal affect    ASSESSMENT:    1. Paroxysmal atrial fibrillation (HCC)   2. Essential hypertension   3. Hyperlipidemia, unspecified hyperlipidemia type   4. Coronary artery calcification seen on CT scan   5. Cardiac risk counseling   6. Counseling on health promotion and disease prevention    PLAN:    Paroxysmal atrial fibrillation: having occasional palpitations, checks with K[employer]ia[employer] device. 30 pages of strips  reviewed today. One event on 09/06/18 is suggestive of afib based on irregular QRS intervals. -CHA2DS2/VAS Stroke Risk Points=3. We discussed anticoagulation at length today. With documentation of a possible episode, he is amenable to trying apixaban. Will stop aspirin to decreased bleeding risk. His biggest risk for bleeding is likely hematuria due to indwelling suprapubic catheter. He will monitor for this. Last Hgb 14. -has maintained sinus rhythm on flecainide for many years. See my initial note with him on this on 11/27/17. He did not have any ischemia on stress test in 2015, but he has coronary calcium (CAD equivalent) on imaging. Again today we discussed alternatives, but he would like to continue flecainide for now.  -we did discuss alternatives such as tikosyn. He would prefer to avoid elective hospitalization for tikosyn load for now, and I agree. Would need to continue on anticoagulation for tikosyn long term as well.   Hypertension: slightly elevated today, but reports that overall control has been good.  -continue lisinopril 20 mg BID (per patient preference, can be taken once daily)  Hyperlipidemia: LDL well controlled -continue rosuvastatin, taking 2.5 mg daily -Noted coronary calcium on imaging, with low risk stress test 2015, asymptomatic. Would aim for LDL <70, which he meets with LDL 63  CV risk and prevention counseling: -recommend heart healthy/Mediterranean diet, with whole grains, fruits, vegetable, fish, lean meats, nuts, and olive oil. Limit salt. -recommend moderate walking, 3-5 times/week for 30-50 minutes each session. Aim for at least 150 minutes.week. Goal should be pace of 3 miles/hours, or walking 1.5 miles in 30 minutes -recommend avoidance of tobacco products. Avoid excess alcohol.  Plan for follow up: 6 months or sooner as needed.  TIME SPENT WITH PATIENT: 35 minutes of direct patient care. More than 50% of that time was spent on coordination of care and  counseling regarding review of his strips, discussion of afib management.  Buford Dresser, MD, PhD [place]  [facility]   Medication Adjustments/Labs and Tests Ordered: Current medicines are reviewed at length with the patient today.  Concerns regarding medicines are outlined above.  Orders Placed This Encounter  Procedures  . EKG 12-Lead   Meds ordered this encounter  Medications  . apixaban (ELIQUIS) 5 MG TABS tablet    Sig: Take 1 tablet (5 mg total) by mouth 2 (two) times daily.    Dispense:  180 tablet    Refill:  3    Patient Instructions  Medication Instructions:  Stop: Aspirin 81 mg daily Start: Eliquis 5  mg two times a day  *If you need a refill on your cardiac medications before your next appointment, please call your pharmacy*  Lab Work: None  Testing/Procedures: None  Follow-Up: At [facility], you and your health needs are our priority.  As part of our continuing mission to provide you with exceptional heart care, we have created designated Provider Care Teams.  These Care Teams include your primary Cardiologist (physician) and Advanced Practice Providers (APPs -  Physician Assistants and Nurse Practitioners) who all work together to provide you with the care you need, when you need it.  Your next appointment:   6 month(s)  The format for your next appointment:   In Person  Provider:   Buford Dresser, MD     Apixaban oral tablets What is this medicine? APIXABAN (a PIX a ban) is an anticoagulant (blood thinner). It is used to lower the chance of stroke in people with a medical condition called atrial fibrillation. It is also used to treat or prevent blood clots in the lungs or in the veins. This medicine may be used for other purposes; ask your health care provider or pharmacist if you have questions. COMMON BRAND NAME(S): Eliquis What should I tell my health care provider before I take this medicine? They need to know if you  have any of these conditions:  antiphospholipid antibody syndrome  bleeding disorders  bleeding in the brain  blood in your stools (black or tarry stools) or if you have blood in your vomit  history of blood clots  history of stomach bleeding  kidney disease  liver disease  mechanical heart valve  an unusual or allergic reaction to apixaban, other medicines, foods, dyes, or preservatives  pregnant or trying to get pregnant  breast-feeding How should I use this medicine? Take this medicine by mouth with a glass of water. Follow the directions on the prescription label. You can take it with or without food. If it upsets your stomach, take it with food. Take your medicine at regular intervals. Do not take it more often than directed. Do not stop taking except on your doctor's advice. Stopping this medicine may increase your risk of a blood clot. Be sure to refill your prescription before you run out of medicine. Talk to your pediatrician regarding the use of this medicine in children. Special care may be needed. Overdosage: If you think you have taken too much of this medicine contact a poison control center or emergency room at once. NOTE: This medicine is only for you. Do not share this medicine with others. What if I miss a dose? If you miss a dose, take it as soon as you can. If it is almost time for your next dose, take only that dose. Do not take double or extra doses. What may interact with this medicine? This medicine may interact with the following:  aspirin and aspirin-like medicines  certain medicines for fungal infections like ketoconazole and itraconazole  certain medicines for seizures like carbamazepine and phenytoin  certain medicines that treat or prevent blood clots like warfarin, enoxaparin, and dalteparin  clarithromycin  NSAIDs, medicines for pain and inflammation, like ibuprofen or naproxen  rifampin  ritonavir  St. John's wort This list may not  describe all possible interactions. Give your health care provider a list of all the medicines, herbs, non-prescription drugs, or dietary supplements you use. Also tell them if you smoke, drink alcohol, or use illegal drugs. Some items may interact with your medicine. What  should I watch for while using this medicine? Visit your healthcare professional for regular checks on your progress. You may need blood work done while you are taking this medicine. Your condition will be monitored carefully while you are receiving this medicine. It is important not to miss any appointments. Avoid sports and activities that might cause injury while you are using this medicine. Severe falls or injuries can cause unseen bleeding. Be careful when using sharp tools or knives. Consider using an Copy. Take special care brushing or flossing your teeth. Report any injuries, bruising, or red spots on the skin to your healthcare professional. If you are going to need surgery or other procedure, tell your healthcare professional that you are taking this medicine. Wear a medical ID bracelet or chain. Carry a card that describes your disease and details of your medicine and dosage times. What side effects may I notice from receiving this medicine? Side effects that you should report to your doctor or health care professional as soon as possible:  allergic reactions like skin rash, itching or hives, swelling of the face, lips, or tongue  signs and symptoms of bleeding such as bloody or black, tarry stools; red or dark-brown urine; spitting up blood or brown material that looks like coffee grounds; red spots on the skin; unusual bruising or bleeding from the eye, gums, or nose  signs and symptoms of a blood clot such as chest pain; shortness of breath; pain, swelling, or warmth in the leg  signs and symptoms of a stroke such as changes in vision; confusion; trouble speaking or understanding; severe headaches; sudden  numbness or weakness of the face, arm or leg; trouble walking; dizziness; loss of coordination This list may not describe all possible side effects. Call your doctor for medical advice about side effects. You may report side effects to FDA at 1-800-FDA-1088. Where should I keep my medicine? Keep out of the reach of children. Store at room temperature between 20 and 25 degrees C (68 and 77 degrees F). Throw away any unused medicine after the expiration date. NOTE: This sheet is a summary. It may not cover all possible information. If you have questions about this medicine, talk to your doctor, pharmacist, or health care provider.  2020 Elsevier/Gold Standard (2017-09-27 17:39:34)     Signed, Buford Dresser, MD PhD 12/25/2018 10:16 AM    [place]

## 2018-12-30 ENCOUNTER — Encounter: Payer: Self-pay | Admitting: Cardiology

## 2019-01-07 DIAGNOSIS — G7000 Myasthenia gravis without (acute) exacerbation: Secondary | ICD-10-CM | POA: Diagnosis not present

## 2019-01-07 DIAGNOSIS — K219 Gastro-esophageal reflux disease without esophagitis: Secondary | ICD-10-CM | POA: Diagnosis not present

## 2019-01-07 DIAGNOSIS — I480 Paroxysmal atrial fibrillation: Secondary | ICD-10-CM | POA: Diagnosis not present

## 2019-01-07 DIAGNOSIS — E039 Hypothyroidism, unspecified: Secondary | ICD-10-CM | POA: Diagnosis not present

## 2019-01-07 DIAGNOSIS — R252 Cramp and spasm: Secondary | ICD-10-CM | POA: Diagnosis not present

## 2019-01-07 DIAGNOSIS — Z9359 Other cystostomy status: Secondary | ICD-10-CM | POA: Diagnosis not present

## 2019-01-07 DIAGNOSIS — R7301 Impaired fasting glucose: Secondary | ICD-10-CM | POA: Diagnosis not present

## 2019-01-07 DIAGNOSIS — I10 Essential (primary) hypertension: Secondary | ICD-10-CM | POA: Diagnosis not present

## 2019-01-07 DIAGNOSIS — E785 Hyperlipidemia, unspecified: Secondary | ICD-10-CM | POA: Diagnosis not present

## 2019-01-07 DIAGNOSIS — D126 Benign neoplasm of colon, unspecified: Secondary | ICD-10-CM | POA: Diagnosis not present

## 2019-01-08 DIAGNOSIS — R252 Cramp and spasm: Secondary | ICD-10-CM | POA: Diagnosis not present

## 2019-01-08 DIAGNOSIS — E038 Other specified hypothyroidism: Secondary | ICD-10-CM | POA: Diagnosis not present

## 2019-01-08 DIAGNOSIS — R7301 Impaired fasting glucose: Secondary | ICD-10-CM | POA: Diagnosis not present

## 2019-01-08 DIAGNOSIS — E7849 Other hyperlipidemia: Secondary | ICD-10-CM | POA: Diagnosis not present

## 2019-02-07 DIAGNOSIS — Z466 Encounter for fitting and adjustment of urinary device: Secondary | ICD-10-CM | POA: Diagnosis not present

## 2019-02-20 ENCOUNTER — Ambulatory Visit: Payer: Medicare Other | Attending: Internal Medicine

## 2019-02-20 DIAGNOSIS — Z23 Encounter for immunization: Secondary | ICD-10-CM | POA: Diagnosis not present

## 2019-02-20 NOTE — Progress Notes (Signed)
   Covid-19 Vaccination Clinic  Name:  Eugene Castaneda    MRN: [id number] DOB: 1945-09-10  02/20/2019  [name] was observed post Covid-19 immunization for 15 minutes without incidence. He was provided with Vaccine Information Sheet and instruction to access the V-Safe system.   [name] was instructed to call 911 with any severe reactions post vaccine: Marland Kitchen Difficulty breathing  . Swelling of your face and throat  . A fast heartbeat  . A bad rash all over your body  . Dizziness and weakness    Immunizations Administered    Name Date Dose VIS Date Route   Pfizer COVID-19 Vaccine 02/20/2019  1:32 PM 0.3 mL 01/11/2019 Intramuscular   Manufacturer: [place]   Lot: [id number]   [place]: [id number]

## 2019-03-07 DIAGNOSIS — Z466 Encounter for fitting and adjustment of urinary device: Secondary | ICD-10-CM | POA: Diagnosis not present

## 2019-03-08 ENCOUNTER — Ambulatory Visit: Payer: Medicare Other

## 2019-03-10 ENCOUNTER — Ambulatory Visit: Payer: Medicare Other

## 2019-03-11 DIAGNOSIS — C61 Malignant neoplasm of prostate: Secondary | ICD-10-CM | POA: Diagnosis not present

## 2019-03-13 ENCOUNTER — Ambulatory Visit: Payer: Medicare Other | Attending: Internal Medicine

## 2019-03-13 DIAGNOSIS — Z23 Encounter for immunization: Secondary | ICD-10-CM | POA: Insufficient documentation

## 2019-03-13 NOTE — Progress Notes (Signed)
   Covid-19 Vaccination Clinic  Name:  Eugene Castaneda    MRN: [id number] DOB: [birth year]  03/13/2019  [name] was observed post Covid-19 immunization for 15 minutes without incidence. He was provided with Vaccine Information Sheet and instruction to access the V-Safe system.   [name] was instructed to call 911 with any severe reactions post vaccine: Marland Kitchen Difficulty breathing  . Swelling of your face and throat  . A fast heartbeat  . A bad rash all over your body  . Dizziness and weakness    Immunizations Administered    Name Date Dose VIS Date Route   Pfizer COVID-19 Vaccine 03/13/2019 12:23 PM 0.3 mL 01/11/2019 Intramuscular   Manufacturer: [place]   Lot: [id number]   [place]: [id number]

## 2019-03-18 DIAGNOSIS — C61 Malignant neoplasm of prostate: Secondary | ICD-10-CM | POA: Diagnosis not present

## 2019-03-18 DIAGNOSIS — R339 Retention of urine, unspecified: Secondary | ICD-10-CM | POA: Diagnosis not present

## 2019-03-18 DIAGNOSIS — N35013 Post-traumatic anterior urethral stricture: Secondary | ICD-10-CM | POA: Diagnosis not present

## 2019-03-18 DIAGNOSIS — N320 Bladder-neck obstruction: Secondary | ICD-10-CM | POA: Diagnosis not present

## 2019-03-18 DIAGNOSIS — N529 Male erectile dysfunction, unspecified: Secondary | ICD-10-CM | POA: Diagnosis not present

## 2019-04-04 DIAGNOSIS — Z466 Encounter for fitting and adjustment of urinary device: Secondary | ICD-10-CM | POA: Diagnosis not present

## 2019-04-04 DIAGNOSIS — R339 Retention of urine, unspecified: Secondary | ICD-10-CM | POA: Diagnosis not present

## 2019-05-02 DIAGNOSIS — R339 Retention of urine, unspecified: Secondary | ICD-10-CM | POA: Diagnosis not present

## 2019-05-02 DIAGNOSIS — Z466 Encounter for fitting and adjustment of urinary device: Secondary | ICD-10-CM | POA: Diagnosis not present

## 2019-05-30 DIAGNOSIS — Z466 Encounter for fitting and adjustment of urinary device: Secondary | ICD-10-CM | POA: Diagnosis not present

## 2019-05-30 DIAGNOSIS — R339 Retention of urine, unspecified: Secondary | ICD-10-CM | POA: Diagnosis not present

## 2019-06-06 ENCOUNTER — Ambulatory Visit (INDEPENDENT_AMBULATORY_CARE_PROVIDER_SITE_OTHER): Payer: Medicare Other | Admitting: Cardiology

## 2019-06-06 ENCOUNTER — Encounter: Payer: Self-pay | Admitting: Cardiology

## 2019-06-06 ENCOUNTER — Other Ambulatory Visit: Payer: Self-pay

## 2019-06-06 VITALS — BP 122/60 | HR 57 | Ht 70.5 in | Wt 200.2 lb

## 2019-06-06 DIAGNOSIS — Z7189 Other specified counseling: Secondary | ICD-10-CM

## 2019-06-06 DIAGNOSIS — I480 Paroxysmal atrial fibrillation: Secondary | ICD-10-CM | POA: Diagnosis not present

## 2019-06-06 DIAGNOSIS — E785 Hyperlipidemia, unspecified: Secondary | ICD-10-CM

## 2019-06-06 DIAGNOSIS — I2510 Atherosclerotic heart disease of native coronary artery without angina pectoris: Secondary | ICD-10-CM

## 2019-06-06 DIAGNOSIS — I10 Essential (primary) hypertension: Secondary | ICD-10-CM

## 2019-06-06 NOTE — Progress Notes (Signed)
Cardiology Office Note:    Date:  06/06/2019   ID:  Eugene Castaneda, DOB January 15, 1946, MRN [id number]  PCP:  [name], MD  Cardiologist:  Buford Dresser, MD   Referring MD: [name], MD   CC: follow up  History of Present Illness:    Eugene Castaneda is a 74 y.o. male with a hx of paroxysmal atrial fibrillation, hypertension, CAD without angina, sick sinus syndrome, hypothyroidism, hyperlipidemia who is seen for follow up. I initially saw him 11/27/2017  as a new patient to [facility] (previously followed by Eugene Castaneda) at the request of Eugene, [name], MD for the evaluation and management of paroxysmal afib.   Cardiac history: paroxysmal afib, on flecainide "for many years," reports prior treadmill tests that I do not have access to. Has declined anticoagulation in the past but was amenable to staring 01/2019. Uses KardiaMobile intermittently to evaluate for afib.  Today: Reviewed patient message, below: In meantime, an update. I started Eliquis December 2020 and stopped flecainide. I then started experiencing (Kardia recorded) tachycardia episodes frequently on a regular basis. (Previously, when I took flecainide I often experienced bradycardias). As for Afib, YTD I have experienced episodes about 10 times (including 3 or 4 times during the past week or so, including today). In all of 2020 I recorded a total of about 8 episodes. I have also experienced several different sinus rhythm episodes YTD (Premature Ventricular Contractions, Supraventicular Ectopy, and Sinus Rhythm with Wide QRS). My Eugene Castaneda history is on file. Might it be timely to consider seeing an electrophysiologist? (Note: I had catheter ablation 11 years ago at [facility]).   Discussed in detail today. Stopped flecainide after last visit, though apixaban would replace flecainide. Brings an extensive log of his Kardia device, with frequent afib episodes. These are brief and self limited, minutes usually. He  has had less bradycardia since stopping the flecainide, but he is bothered by the afib episodes.  Brings results of his lifeline screening with him today. Mild bilateral carotid disease, normal abdominal aorta, normal ABI. Lipids Tchol 102, HDL 46, LDL 34, TG 109. Hs-CRP 1 (normal).  No bleeding issues on the apixaban. No missed doses. Still swimming three times/week for an hour each time, able to continue swimming but sometimes notices that he doesn't feel right. Walks as well, mild shortness of breath on hills but otherwise no issue.  Denies chest pain, shortness of breath at rest or with normal exertion. No PND, orthopnea, LE edema or unexpected weight gain. No syncope.   Past Medical History:  Diagnosis Date  . CAD (coronary artery disease), native coronary artery 11/13/2017   Coronary calcification noted on CT scan   . Chronic kidney disease (CKD), stage III (moderate) 11/13/2017  . Diplopia   . Foley catheter present    pt has supra-pubic foley catheter in place/since 2016  . GERD (gastroesophageal reflux disease)   . Hyperlipemia   . Hypertension   . Hypertensive heart disease without CHF 11/13/2017  . Hypothyroidism   . Impaired fasting glucose   . MG (myasthenia gravis) ([place])   . Paroxysmal atrial fibrillation (Eugene Castaneda) 11/13/2017   RF ablation Dr. [name] 2010 [facility] CHADSVASC 3  . Prostate cancer ([place]) 2009  . Urethral obstruction   . Urinary retention    Suprapubic Catheter placed 04/25/15    Past Surgical History:  Procedure Laterality Date  . 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 catheter ablationfor afib  2010  . COLONOSCOPY    . PROSTATECTOMY  2009  . TONSILLECTOMY      Current Medications: Current Outpatient Medications on File Prior to Visit  Medication Sig  . apixaban (ELIQUIS) 5 MG TABS tablet Take 1 tablet (5 mg total) by mouth 2 (two) times daily.    Marland Kitchen azaTHIOprine (IMURAN) 50 MG tablet Take 2 tablets twice daily.  . cholecalciferol (VITAMIN D) 1000 UNITS tablet Take 1,000 Units by mouth daily.  . Fish Oil OIL 1,000 mg by Does not apply route daily.   Marland Kitchen gluconic acid-citric acid (RENACIDIN) irrigation   . Leuprolide Acetate (LUPRON IJ) Inject as directed as needed.   Marland Kitchen levothyroxine (SYNTHROID, LEVOTHROID) 150 MCG tablet Take 1 tablet by mouth daily.  Marland Kitchen lisinopril (PRINIVIL,ZESTRIL) 20 MG tablet Take 20 mg by mouth 2 (two) times a day.   . Multiple Vitamin (MULTIVITAMIN) tablet Take 1 tablet by mouth daily.  Marland Kitchen omeprazole (PRILOSEC) 20 MG capsule Take 20 mg by mouth daily.   Marland Kitchen oxybutynin (DITROPAN-XL) 10 MG 24 hr tablet Take 10 mg by mouth as needed.  . rosuvastatin (CRESTOR) 5 MG tablet Take 2.5 mg by mouth daily.   No current facility-administered medications on file prior to visit.     Allergies:   Patient has no known allergies.   Social History   Tobacco Use  . Smoking status: Former Smoker    Quit date: 02/01/1980    Years since quitting: 39.3  . Smokeless tobacco: Never Used  Substance Use Topics  . Alcohol use: Yes    Alcohol/week: 0.0 standard drinks    Comment: 2 glasses Castaneda wine daily  . Drug use: No    Family History: The patient's family history includes Breast cancer in his mother; Parkinson's disease in his father.  ROS:   Please see the history of present illness.  Additional pertinent ROS otherwise unremarkable.   EKGs/Labs/Other Studies Reviewed:    The following studies were reviewed today: Per Dr. Thurman Coyer notes: treadmill myoview, 04/18/2013.  He did 9 minutes, up to stage II Bruce, for a workload total of 10 METs. Heart rate went from 62 bpm to 144 bpm. No significant ST changes or arrhythmia noted. Imaging suggests a low risk study, with inferolateral ischemia vs more likely artifact, EF 78%.  Echo 10/12/2015 Mild cLVH with normal WM. EF 55% Moderate LAE  Mild RAE Trace AR Mild MR Trace TR and  PR IVC dilated, <50% RC  RF ablation 03/2008 Treadmill 05/2003, 03/2008, 03/2013 Echo 03/2003  EKG:  EKG is reviewed today.  The ekg ordered 12/31/18 demonstrates sinus bradycardia, 1st degree AV block, incomplete RBBB at rate of 50 bpm.  Recent Labs: No results found for requested labs within last 8760 hours.  Recent Lipid Panel No results found for: CHOL, TRIG, HDL, CHOLHDL, VLDL, LDLCALC, LDLDIRECT   Physical Exam:    VS:  BP 122/60   Pulse (!) 57   Ht 5' 10.5" (1.791 m)   Wt 200 lb 3.2 oz (90.8 kg)   SpO2 98%   BMI 28.32 kg/m     Wt Readings from Last 3 Encounters:  06/06/19 200 lb 3.2 oz (90.8 kg)  12/25/18 198 lb 3.2 oz (89.9 kg)  10/22/18 199 lb 6.4 oz (90.4 kg)    GEN: Well nourished, well developed in no acute distress HEENT: Normal, moist mucous membranes NECK: No JVD CARDIAC: regular rhythm, normal S1 and S2, no rubs or gallops. No murmur.  VASCULAR: Radial and DP pulses 2+ bilaterally. No carotid bruits RESPIRATORY:  Clear to auscultation without rales, wheezing or rhonchi  ABDOMEN: Soft, non-tender, non-distended MUSCULOSKELETAL:  Ambulates independently SKIN: Warm and dry, no edema NEUROLOGIC:  Alert and oriented x 3. No focal neuro deficits noted. PSYCHIATRIC:  Normal affect   ASSESSMENT:    1. Paroxysmal atrial fibrillation (HCC)   2. Essential hypertension   3. Hyperlipidemia, unspecified hyperlipidemia type   4. Coronary artery calcification seen on CT scan   5. Cardiac risk counseling   6. Counseling on health promotion and disease prevention    PLAN:    Paroxysmal atrial fibrillation:  -CHA2DS2/VAS Stroke Risk Points=3. -doing well on apixaban, no significant bleeding episodes -he self stopped flecainide after last visit (thought the apixaban replaced) and has had increasing episodes of atrial fibrillation -has maintained sinus rhythm on flecainide for many years. See my initial note with him on this on 11/27/17. He did not have any ischemia on  stress test in 2015, but he has coronary calcium (CAD equivalent) on imaging.  -we did discuss alternatives such as tikosyn. However, he has intermittent bradycardia, so this may not be a good long term option -we discussed multiple approaches. He would like to see EP to see if he is a candidate for a repeat ablation, as he did well for many years with this. If not, he would be interested in discussed medical therapy with them.   Hypertension: well controlled today  -continue lisinopril 20 mg BID (per patient preference, can be taken once daily)  Hyperlipidemia: LDL well controlled -continue rosuvastatin, taking 2.5 mg daily -Noted coronary calcium on imaging, with low risk stress test 2015, asymptomatic. Would aim for LDL <70, which he meets with LDL 34 on lifeline screening  CV risk and prevention counseling: -recommend heart healthy/Mediterranean diet, with whole grains, fruits, vegetable, fish, lean meats, nuts, and olive oil. Limit salt. -recommend moderate walking, 3-5 times/week for 30-50 minutes each session. Aim for at least 150 minutes.week. Goal should be pace of 3 miles/hours, or walking 1.5 miles in 30 minutes -recommend avoidance of tobacco products. Avoid excess alcohol.  Plan for follow up: 6 mos or sooner with me as needed, anticipate interim EP evaluation  Buford Dresser, MD, PhD [place]  [facility]   Medication Adjustments/Labs and Tests Ordered: Current medicines are reviewed at length with the patient today.  Concerns regarding medicines are outlined above.  Orders Placed This Encounter  Procedures  . Ambulatory referral to Cardiac Electrophysiology   No orders of the defined types were placed in this encounter.   Patient Instructions  Medication Instructions:  Your Physician recommend you continue on your current medication as directed.    *If you need a refill on your cardiac medications before your next appointment, please call your  pharmacy*   Lab Work: None   Testing/Procedures: None   Follow-Up: At [facility], you and your health needs are our priority.  As part of our continuing mission to provide you with exceptional heart care, we have created designated Provider Care Teams.  These Care Teams include your primary Cardiologist (physician) and Advanced Practice Providers (APPs -  Physician Assistants and Nurse Practitioners) who all work together to provide you with the care you need, when you need it.  We recommend signing up for the patient portal called "MyChart".  Sign up information is provided on this After Visit Summary.  MyChart is used to connect with patients for Virtual Visits (Telemedicine).  Patients are able to view lab/test results, encounter notes, upcoming appointments, etc.  Non-urgent messages can be sent to your provider as well.   To learn more about what you can do with MyChart, go to [email or web address].    Your next appointment:   6 month(s)  The format for your next appointment:   In Person  Provider:   Buford Dresser, MD  Referred to Electrophysiology- Someone from their office will contact you to schedule appointment.      Signed, Buford Dresser, MD PhD 06/06/2019 3:12 PM    [place]

## 2019-06-06 NOTE — Patient Instructions (Addendum)
Medication Instructions:  Your Physician recommend you continue on your current medication as directed.    *If you need a refill on your cardiac medications before your next appointment, please call your pharmacy*   Lab Work: None   Testing/Procedures: None   Follow-Up: At [facility], you and your health needs are our priority.  As part of our continuing mission to provide you with exceptional heart care, we have created designated Provider Care Teams.  These Care Teams include your primary Cardiologist (physician) and Advanced Practice Providers (APPs -  Physician Assistants and Nurse Practitioners) who all work together to provide you with the care you need, when you need it.  We recommend signing up for the patient portal called "MyChart".  Sign up information is provided on this After Visit Summary.  MyChart is used to connect with patients for Virtual Visits (Telemedicine).  Patients are able to view lab/test results, encounter notes, upcoming appointments, etc.  Non-urgent messages can be sent to your provider as well.   To learn more about what you can do with MyChart, go to [email or web address].    Your next appointment:   6 month(s)  The format for your next appointment:   In Person  Provider:   Buford Dresser, MD  Referred to Electrophysiology- Someone from their office will contact you to schedule appointment.

## 2019-06-10 ENCOUNTER — Other Ambulatory Visit: Payer: Self-pay

## 2019-06-10 ENCOUNTER — Encounter: Payer: Self-pay | Admitting: Internal Medicine

## 2019-06-10 ENCOUNTER — Telehealth (INDEPENDENT_AMBULATORY_CARE_PROVIDER_SITE_OTHER): Payer: Medicare Other | Admitting: Internal Medicine

## 2019-06-10 ENCOUNTER — Telehealth: Payer: Self-pay

## 2019-06-10 DIAGNOSIS — I480 Paroxysmal atrial fibrillation: Secondary | ICD-10-CM

## 2019-06-10 DIAGNOSIS — I119 Hypertensive heart disease without heart failure: Secondary | ICD-10-CM

## 2019-06-10 DIAGNOSIS — I4891 Unspecified atrial fibrillation: Secondary | ICD-10-CM

## 2019-06-10 DIAGNOSIS — C61 Malignant neoplasm of prostate: Secondary | ICD-10-CM | POA: Diagnosis not present

## 2019-06-10 NOTE — Progress Notes (Signed)
Electrophysiology TeleHealth Note   Due to national recommendations of social distancing due to [place] 19, Audio/video telehealth visit is felt to be most appropriate for this patient at this time.  See MyChart message from today for patient consent regarding telehealth for [facility].   Date:  06/10/2019   ID:  Eugene Castaneda, DOB 1945/12/01, MRN [id number]  Location: home  Provider location: [place] Evaluation Performed: New patient consult  PCP:  [name], MD  Cardiologist:  Buford Dresser, MD Electrophysiologist:  None   Chief Complaint:  afib  History of Present Illness:    Eugene Castaneda is a 74 y.o. male who presents via audio/video conferencing for a telehealth visit today.   The patient is referred for new consultation regarding afib by Dr Harrell Gave.  He reports initially having symptoms of atrial fibrillation in the late 1980s.  He was eventually diagnosed with afib by Dr Wynonia Lawman.  He underwent afib ablation in 2010 at [facility] by Dr [name].  He had afib post ablation and was placed on flecainide. 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  He uses KardiaMobile and follows his rhythm with this. He is anticoagulated with eliquis. Most episodes are short.  He also has PVCs as well as NSVT. He is active and swims regularly without limitation.  He has symptoms of palpitations with his afib.    Today, he denies symptoms of palpitations, chest pain, shortness of breath, orthopnea, PND, lower extremity edema, claudication, dizziness, presyncope, syncope, bleeding, or neurologic sequela. The patient is tolerating medications without difficulties and is otherwise without complaint today.     Past Medical History:  Diagnosis Date  . CAD (coronary artery disease), native coronary artery 11/13/2017   Coronary calcification noted on CT scan   . Chronic kidney disease (CKD), stage III (moderate) 11/13/2017  . Diplopia   . Foley  catheter present    pt has supra-pubic foley catheter in place/since 2016  . GERD (gastroesophageal reflux disease)   . Hyperlipemia   . Hypertension   . Hypertensive heart disease without CHF 11/13/2017  . Hypothyroidism   . Impaired fasting glucose   . MG (myasthenia gravis) ([place])   . Paroxysmal atrial fibrillation ([place]) 11/13/2017   RF ablation Dr. [name] 2010 [facility] CHADSVASC 3  . Prostate cancer ([place]) 2009  . Urethral obstruction   . Urinary retention    Suprapubic Catheter placed 04/25/15    Past Surgical History:  Procedure Laterality Date  . 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 catheter ablationfor afib  2010  . COLONOSCOPY    . PROSTATECTOMY  2009  . TONSILLECTOMY      Current Outpatient Medications  Medication Sig Dispense Refill  . apixaban (ELIQUIS) 5 MG TABS tablet Take 1 tablet (5 mg total) by mouth 2 (two) times daily. 180 tablet 3  . azaTHIOprine (IMURAN) 50 MG tablet Take 2 tablets twice daily. 360 tablet 3  . cholecalciferol (VITAMIN D) 1000 UNITS tablet Take 1,000 Units by mouth daily.    . Fish Oil OIL 1,000 mg by Does not apply route daily.     Marland Kitchen gluconic acid-citric acid (RENACIDIN) irrigation     . Leuprolide Acetate (LUPRON IJ) Inject as directed as needed.     Marland Kitchen levothyroxine (SYNTHROID, LEVOTHROID) 150 MCG tablet Take 1 tablet by mouth daily.    Marland Kitchen lisinopril (PRINIVIL,ZESTRIL) 20  MG tablet Take 20 mg by mouth 2 (two) times a day.     . Multiple Vitamin (MULTIVITAMIN) tablet Take 1 tablet by mouth daily.    Marland Kitchen omeprazole (PRILOSEC) 20 MG capsule Take 20 mg by mouth daily.     Marland Kitchen oxybutynin (DITROPAN-XL) 10 MG 24 hr tablet Take 10 mg by mouth as needed.    . rosuvastatin (CRESTOR) 5 MG tablet Take 2.5 mg by mouth daily.     No current facility-administered medications for this visit.    Allergies:   Patient has no known allergies.   Social  History:  The patient  reports that he quit smoking about 39 years ago. He has never used smokeless tobacco. He reports current alcohol use. He reports that he does not use drugs.   Family History:  The patient's family history includes Breast cancer in his mother; Parkinson's disease in his father.    ROS:  Please see the history of present illness.   All other systems are personally reviewed and negative.    Exam:    Vital Signs:  There were no vitals taken for this visit.   Well appearing, alert and conversant, regular work of breathing,  good skin color Eyes- anicteric, neuro- grossly intact, skin- no apparent rash or lesions or cyanosis, mouth- oral mucosa is pink   Labs/Other Tests and Data Reviewed:    Recent Labs: No results found for requested labs within last 8760 hours.   Wt Readings from Last 3 Encounters:  06/06/19 200 lb 3.2 oz (90.8 kg)  12/25/18 198 lb 3.2 oz (89.9 kg)  10/22/18 199 lb 6.4 oz (90.4 kg)     Other studies personally reviewed: Additional studies/ records that were reviewed today include: Dr Mariea Clonts notes, prior ekgs,   Review of the above records today demonstrates: as above   ASSESSMENT & PLAN:    1.  Paroxysmal atrial fibrillation The patient has symptomatic, recurrent paroxysmal atrial fibrillation. he has failed medical therapy with flecainide.  He underwent afib ablation by Dr [name] in 2010 and has done well initially post ablation but has had recurrence of afib Chads2vasc score is 2.  he is anticoagulated with eliquis . Therapeutic strategies for afib including medicine (flecainide, tikosyn, amiodarone) and repeat ablation were discussed in detail with the patient today. Risk, benefits, and alternatives to EP study and radiofrequency ablation for afib were also discussed in detail today. These risks include but are not limited to stroke, bleeding, vascular damage, tamponade, perforation, damage to the esophagus, lungs, and other  structures, pulmonary vein stenosis, worsening renal function, and death. The patient understands these risk and wishes to proceed.  We will therefore proceed with catheter ablation at the next available time.  Carto, ICE, anesthesia are requested for the procedure.  Will also obtain cardiac CT prior to the procedure to exclude LAA thrombus and further evaluate atrial anatomy. We will also obtain an echo at this time to evaluate for structural changes related to afib  2. Hypertensive cardiovascular disease Stable No change required today    Patient Risk:  after full review of this patients clinical status, I feel that they are at moderate risk at this time.   Today, I have spent 25 minutes with the patient with telehealth technology discussing afib .    Signed, [name] MD, [place] 06/10/2019 3:46 PM   [facility] [address] [place] [address] [phone] (office) [phone] (fax)

## 2019-06-10 NOTE — Telephone Encounter (Signed)
-----   Message from [name], MD sent at 06/10/2019  3:50 PM EDT ----- Echo to evaluate afib   Afib ablation [facility]  Cardiac CT

## 2019-06-11 ENCOUNTER — Telehealth: Payer: Self-pay | Admitting: Internal Medicine

## 2019-06-11 NOTE — Telephone Encounter (Signed)
Follow Up:    Pt wanted you to know he have appointment scheduled for 07-15-19 and 07-16-19. The appointment on 07-15-19 can be rescheduled if necessary.

## 2019-06-11 NOTE — Telephone Encounter (Signed)
Call placed to Pt.  Pt scheduled for afib ablation on June 22   Will get lab work same day as echo June 2  covid test June 18  Instruction letters sent via [place].  Work up complete.

## 2019-06-25 DIAGNOSIS — H2513 Age-related nuclear cataract, bilateral: Secondary | ICD-10-CM | POA: Diagnosis not present

## 2019-06-25 DIAGNOSIS — H52203 Unspecified astigmatism, bilateral: Secondary | ICD-10-CM | POA: Diagnosis not present

## 2019-06-25 DIAGNOSIS — H5203 Hypermetropia, bilateral: Secondary | ICD-10-CM | POA: Diagnosis not present

## 2019-07-03 ENCOUNTER — Other Ambulatory Visit: Payer: Medicare Other | Admitting: *Deleted

## 2019-07-03 ENCOUNTER — Ambulatory Visit (HOSPITAL_COMMUNITY): Payer: Medicare Other | Attending: Cardiology

## 2019-07-03 ENCOUNTER — Other Ambulatory Visit: Payer: Self-pay

## 2019-07-03 DIAGNOSIS — I480 Paroxysmal atrial fibrillation: Secondary | ICD-10-CM

## 2019-07-03 LAB — BASIC METABOLIC PANEL
BUN/Creatinine Ratio: 10 (ref 10–24)
BUN: 12 mg/dL (ref 8–27)
CO2: 25 mmol/L (ref 20–29)
Calcium: 10.1 mg/dL (ref 8.6–10.2)
Chloride: 101 mmol/L (ref 96–106)
Creatinine, Ser: 1.16 mg/dL (ref 0.76–1.27)
GFR calc Af Amer: 71 mL/min/{1.73_m2} (ref >59)
GFR calc non Af Amer: 62 mL/min/{1.73_m2} (ref >59)
Glucose: 115 mg/dL — ABNORMAL HIGH (ref 65–99)
Potassium: 4.9 mmol/L (ref 3.5–5.2)
Sodium: 141 mmol/L (ref 134–144)

## 2019-07-03 LAB — CBC WITH DIFFERENTIAL/PLATELET
Basophils Absolute: 0 10*3/uL (ref 0.0–0.2)
Basos: 0 %
EOS (ABSOLUTE): 0.1 10*3/uL (ref 0.0–0.4)
Eos: 2 %
Hematocrit: 41.3 % (ref 37.5–51.0)
Hemoglobin: 14 g/dL (ref 13.0–17.7)
Immature Grans (Abs): 0 10*3/uL (ref 0.0–0.1)
Immature Granulocytes: 1 %
Lymphocytes Absolute: 1 10*3/uL (ref 0.7–3.1)
Lymphs: 21 %
MCH: 31.4 pg (ref 26.6–33.0)
MCHC: 33.9 g/dL (ref 31.5–35.7)
MCV: 93 fL (ref 79–97)
Monocytes Absolute: 0.6 10*3/uL (ref 0.1–0.9)
Monocytes: 12 %
Neutrophils Absolute: 3.2 10*3/uL (ref 1.4–7.0)
Neutrophils: 64 %
Platelets: 287 10*3/uL (ref 150–450)
RBC: 4.46 x10E6/uL (ref 4.14–5.80)
RDW: 15.3 % (ref 11.6–15.4)
WBC: 5 10*3/uL (ref 3.4–10.8)

## 2019-07-09 DIAGNOSIS — E7849 Other hyperlipidemia: Secondary | ICD-10-CM | POA: Diagnosis not present

## 2019-07-09 DIAGNOSIS — R7301 Impaired fasting glucose: Secondary | ICD-10-CM | POA: Diagnosis not present

## 2019-07-09 DIAGNOSIS — Z125 Encounter for screening for malignant neoplasm of prostate: Secondary | ICD-10-CM | POA: Diagnosis not present

## 2019-07-09 DIAGNOSIS — E038 Other specified hypothyroidism: Secondary | ICD-10-CM | POA: Diagnosis not present

## 2019-07-15 ENCOUNTER — Telehealth (HOSPITAL_COMMUNITY): Payer: Self-pay | Admitting: *Deleted

## 2019-07-15 NOTE — Telephone Encounter (Signed)
Returning pt's call regarding upcoming cardiac imaging study; pt verbalizes understanding of appt date/time, parking situation and where to check in, pre-test NPO status and medications ordered, and verified current allergies; name and call back number provided for further questions should they arise  Burley Saver RN Navigator Cardiac [place] [facility] [phone] office [phone] cell

## 2019-07-15 NOTE — Telephone Encounter (Signed)
Attempted to call patient regarding upcoming cardiac CT appointment. Left message on voicemail with name and callback number  Cayenne Breault Tai RN Navigator Cardiac Imaging [facility] [phone] Office [phone] Cell  

## 2019-07-16 DIAGNOSIS — R7301 Impaired fasting glucose: Secondary | ICD-10-CM | POA: Diagnosis not present

## 2019-07-16 DIAGNOSIS — Z9359 Other cystostomy status: Secondary | ICD-10-CM | POA: Diagnosis not present

## 2019-07-16 DIAGNOSIS — R82998 Other abnormal findings in urine: Secondary | ICD-10-CM | POA: Diagnosis not present

## 2019-07-16 DIAGNOSIS — E039 Hypothyroidism, unspecified: Secondary | ICD-10-CM | POA: Diagnosis not present

## 2019-07-16 DIAGNOSIS — I480 Paroxysmal atrial fibrillation: Secondary | ICD-10-CM | POA: Diagnosis not present

## 2019-07-16 DIAGNOSIS — Z0000 Encounter for general adult medical examination without abnormal findings: Secondary | ICD-10-CM | POA: Diagnosis not present

## 2019-07-16 DIAGNOSIS — G7000 Myasthenia gravis without (acute) exacerbation: Secondary | ICD-10-CM | POA: Diagnosis not present

## 2019-07-16 DIAGNOSIS — C61 Malignant neoplasm of prostate: Secondary | ICD-10-CM | POA: Diagnosis not present

## 2019-07-16 DIAGNOSIS — K219 Gastro-esophageal reflux disease without esophagitis: Secondary | ICD-10-CM | POA: Diagnosis not present

## 2019-07-16 DIAGNOSIS — D126 Benign neoplasm of colon, unspecified: Secondary | ICD-10-CM | POA: Diagnosis not present

## 2019-07-16 DIAGNOSIS — E785 Hyperlipidemia, unspecified: Secondary | ICD-10-CM | POA: Diagnosis not present

## 2019-07-16 DIAGNOSIS — M1990 Unspecified osteoarthritis, unspecified site: Secondary | ICD-10-CM | POA: Diagnosis not present

## 2019-07-16 DIAGNOSIS — I10 Essential (primary) hypertension: Secondary | ICD-10-CM | POA: Diagnosis not present

## 2019-07-17 ENCOUNTER — Other Ambulatory Visit: Payer: Self-pay

## 2019-07-17 ENCOUNTER — Ambulatory Visit (HOSPITAL_COMMUNITY)
Admission: RE | Admit: 2019-07-17 | Discharge: 2019-07-17 | Disposition: A | Discharge status: Home / Self Care | Payer: Medicare Other | Source: Ambulatory Visit | Attending: Internal Medicine | Admitting: Internal Medicine

## 2019-07-17 DIAGNOSIS — I4891 Unspecified atrial fibrillation: Secondary | ICD-10-CM | POA: Insufficient documentation

## 2019-07-17 MED ORDER — IOHEXOL 350 MG/ML SOLN
80.0000 mL | Freq: Once | INTRAVENOUS | Status: AC | PRN
  Administered 2019-07-17: 80 mL via INTRAVENOUS

## 2019-07-19 ENCOUNTER — Other Ambulatory Visit (HOSPITAL_COMMUNITY)
Admission: RE | Admit: 2019-07-19 | Discharge: 2019-07-19 | Disposition: A | Discharge status: Home / Self Care | Payer: Medicare Other | Source: Ambulatory Visit | Attending: Internal Medicine | Admitting: Internal Medicine

## 2019-07-19 DIAGNOSIS — Z01812 Encounter for preprocedural laboratory examination: Secondary | ICD-10-CM | POA: Diagnosis present

## 2019-07-19 DIAGNOSIS — Z20822 Contact with and (suspected) exposure to covid-19: Secondary | ICD-10-CM | POA: Insufficient documentation

## 2019-07-19 LAB — SARS CORONAVIRUS 2 (TAT 6-24 HRS): SARS Coronavirus 2: NEGATIVE

## 2019-07-22 ENCOUNTER — Telehealth: Payer: Self-pay | Admitting: Internal Medicine

## 2019-07-22 NOTE — Telephone Encounter (Signed)
Pt reports that he never got instructions for procedure tomorrow. Aware I would resend instructions via mychart, but also verbally reviewed. Patient verbalized understanding and agreeable to plan.

## 2019-07-22 NOTE — Telephone Encounter (Signed)
Patient is requesting to discuss instructions for afib ablation scheduled for 07/23/19 with JA. Please call.

## 2019-07-23 ENCOUNTER — Ambulatory Visit (HOSPITAL_COMMUNITY)
Admission: RE | Admit: 2019-07-23 | Discharge: 2019-07-23 | Disposition: A | Discharge status: Home / Self Care | Payer: Medicare Other | Attending: Internal Medicine | Admitting: Internal Medicine

## 2019-07-23 ENCOUNTER — Ambulatory Visit (HOSPITAL_COMMUNITY): Payer: Medicare Other | Admitting: Anesthesiology

## 2019-07-23 ENCOUNTER — Encounter (HOSPITAL_COMMUNITY)
Admission: RE | Disposition: A | Discharge status: Home / Self Care | Payer: Medicare Other | Source: Home / Self Care | Attending: Internal Medicine

## 2019-07-23 ENCOUNTER — Other Ambulatory Visit: Payer: Self-pay

## 2019-07-23 DIAGNOSIS — Z79899 Other long term (current) drug therapy: Secondary | ICD-10-CM | POA: Diagnosis not present

## 2019-07-23 DIAGNOSIS — I471 Supraventricular tachycardia: Secondary | ICD-10-CM | POA: Insufficient documentation

## 2019-07-23 DIAGNOSIS — Z87891 Personal history of nicotine dependence: Secondary | ICD-10-CM | POA: Insufficient documentation

## 2019-07-23 DIAGNOSIS — Z9079 Acquired absence of other genital organ(s): Secondary | ICD-10-CM | POA: Diagnosis not present

## 2019-07-23 DIAGNOSIS — N183 Chronic kidney disease, stage 3 unspecified: Secondary | ICD-10-CM | POA: Insufficient documentation

## 2019-07-23 DIAGNOSIS — G7000 Myasthenia gravis without (acute) exacerbation: Secondary | ICD-10-CM | POA: Insufficient documentation

## 2019-07-23 DIAGNOSIS — I2510 Atherosclerotic heart disease of native coronary artery without angina pectoris: Secondary | ICD-10-CM | POA: Diagnosis not present

## 2019-07-23 DIAGNOSIS — K219 Gastro-esophageal reflux disease without esophagitis: Secondary | ICD-10-CM | POA: Diagnosis not present

## 2019-07-23 DIAGNOSIS — Z79890 Hormone replacement therapy (postmenopausal): Secondary | ICD-10-CM | POA: Insufficient documentation

## 2019-07-23 DIAGNOSIS — I480 Paroxysmal atrial fibrillation: Secondary | ICD-10-CM | POA: Insufficient documentation

## 2019-07-23 DIAGNOSIS — E039 Hypothyroidism, unspecified: Secondary | ICD-10-CM | POA: Diagnosis not present

## 2019-07-23 DIAGNOSIS — I484 Atypical atrial flutter: Secondary | ICD-10-CM | POA: Insufficient documentation

## 2019-07-23 DIAGNOSIS — E785 Hyperlipidemia, unspecified: Secondary | ICD-10-CM | POA: Diagnosis not present

## 2019-07-23 DIAGNOSIS — Z7901 Long term (current) use of anticoagulants: Secondary | ICD-10-CM | POA: Diagnosis not present

## 2019-07-23 DIAGNOSIS — I129 Hypertensive chronic kidney disease with stage 1 through stage 4 chronic kidney disease, or unspecified chronic kidney disease: Secondary | ICD-10-CM | POA: Insufficient documentation

## 2019-07-23 DIAGNOSIS — I4892 Unspecified atrial flutter: Secondary | ICD-10-CM | POA: Diagnosis not present

## 2019-07-23 HISTORY — PX: ATRIAL FIBRILLATION ABLATION: EP1191

## 2019-07-23 LAB — POCT ACTIVATED CLOTTING TIME: Activated Clotting Time: 279 seconds

## 2019-07-23 SURGERY — ATRIAL FIBRILLATION ABLATION
Anesthesia: General

## 2019-07-23 MED ORDER — ROCURONIUM BROMIDE 10 MG/ML (PF) SYRINGE
PREFILLED_SYRINGE | INTRAVENOUS | Status: DC | PRN
  Administered 2019-07-23: 30 mg via INTRAVENOUS
  Administered 2019-07-23 (×2): 10 mg via INTRAVENOUS

## 2019-07-23 MED ORDER — SODIUM CHLORIDE 0.9% FLUSH: 3.0000 mL | Freq: Two times a day (BID) | INTRAVENOUS | Status: DC

## 2019-07-23 MED ORDER — APIXABAN 5 MG PO TABS
5.0000 mg | ORAL_TABLET | Freq: Once | ORAL | Status: AC
  Administered 2019-07-23: 5 mg via ORAL
  Filled 2019-07-23: qty 1

## 2019-07-23 MED ORDER — HYDROCODONE-ACETAMINOPHEN 5-325 MG PO TABS: 1.0000 | ORAL_TABLET | ORAL | Status: DC | PRN

## 2019-07-23 MED ORDER — ACETAMINOPHEN 325 MG PO TABS: 650.0000 mg | ORAL_TABLET | ORAL | Status: DC | PRN

## 2019-07-23 MED ORDER — HEPARIN (PORCINE) IN NACL 1000-0.9 UT/500ML-% IV SOLN
INTRAVENOUS | Status: DC | PRN
  Administered 2019-07-23: 500 mL

## 2019-07-23 MED ORDER — SUGAMMADEX SODIUM 200 MG/2ML IV SOLN
INTRAVENOUS | Status: DC | PRN
  Administered 2019-07-23: 175 mg via INTRAVENOUS

## 2019-07-23 MED ORDER — ONDANSETRON HCL 4 MG/2ML IJ SOLN: 4.0000 mg | Freq: Once | INTRAMUSCULAR | Status: DC | PRN

## 2019-07-23 MED ORDER — SODIUM CHLORIDE 0.9 % IV SOLN: INTRAVENOUS | Status: DC

## 2019-07-23 MED ORDER — PHENYLEPHRINE 40 MCG/ML (10ML) SYRINGE FOR IV PUSH (FOR BLOOD PRESSURE SUPPORT)
PREFILLED_SYRINGE | INTRAVENOUS | Status: DC | PRN
  Administered 2019-07-23 (×2): 80 ug via INTRAVENOUS

## 2019-07-23 MED ORDER — OXYCODONE HCL 5 MG/5ML PO SOLN
5.0000 mg | Freq: Once | ORAL | Status: DC | PRN
  Filled 2019-07-23: qty 5

## 2019-07-23 MED ORDER — ADENOSINE 6 MG/2ML IV SOLN
INTRAVENOUS | Status: AC
  Filled 2019-07-23: qty 2

## 2019-07-23 MED ORDER — PHENYLEPHRINE HCL-NACL 10-0.9 MG/250ML-% IV SOLN
INTRAVENOUS | Status: DC | PRN
  Administered 2019-07-23: 30 ug/min via INTRAVENOUS

## 2019-07-23 MED ORDER — ISOPROTERENOL HCL 0.2 MG/ML IJ SOLN
INTRAVENOUS | Status: DC | PRN
  Administered 2019-07-23: 20 ug/min via INTRAVENOUS

## 2019-07-23 MED ORDER — HEPARIN SODIUM (PORCINE) 1000 UNIT/ML IJ SOLN
INTRAMUSCULAR | Status: DC | PRN
Start: 2019-07-23 — End: 2019-07-23
  Administered 2019-07-23: 5000 [IU] via INTRAVENOUS

## 2019-07-23 MED ORDER — PROPOFOL 10 MG/ML IV BOLUS
INTRAVENOUS | Status: DC | PRN
  Administered 2019-07-23: 150 mg via INTRAVENOUS
  Administered 2019-07-23: 50 mg via INTRAVENOUS

## 2019-07-23 MED ORDER — HEPARIN SODIUM (PORCINE) 1000 UNIT/ML IJ SOLN
INTRAMUSCULAR | Status: DC | PRN
  Administered 2019-07-23: 1000 [IU] via INTRAVENOUS
  Administered 2019-07-23: 14000 [IU] via INTRAVENOUS

## 2019-07-23 MED ORDER — HEPARIN SODIUM (PORCINE) 1000 UNIT/ML IJ SOLN
INTRAMUSCULAR | Status: AC
  Filled 2019-07-23: qty 1

## 2019-07-23 MED ORDER — SODIUM CHLORIDE 0.9 % IV SOLN: 250.0000 mL | INTRAVENOUS | Status: DC | PRN

## 2019-07-23 MED ORDER — PROTAMINE SULFATE 10 MG/ML IV SOLN
INTRAVENOUS | Status: DC | PRN
Start: 2019-07-23 — End: 2019-07-23
  Administered 2019-07-23: 10 mg via INTRAVENOUS
  Administered 2019-07-23: 30 mg via INTRAVENOUS

## 2019-07-23 MED ORDER — LIDOCAINE 2% (20 MG/ML) 5 ML SYRINGE
INTRAMUSCULAR | Status: DC | PRN
  Administered 2019-07-23: 40 mg via INTRAVENOUS

## 2019-07-23 MED ORDER — EPHEDRINE SULFATE-NACL 50-0.9 MG/10ML-% IV SOSY
PREFILLED_SYRINGE | INTRAVENOUS | Status: DC | PRN
  Administered 2019-07-23 (×3): 10 mg via INTRAVENOUS

## 2019-07-23 MED ORDER — MIDAZOLAM HCL 5 MG/5ML IJ SOLN: INTRAMUSCULAR | Status: DC | PRN

## 2019-07-23 MED ORDER — ONDANSETRON HCL 4 MG/2ML IJ SOLN
INTRAMUSCULAR | Status: DC | PRN
  Administered 2019-07-23: 4 mg via INTRAVENOUS

## 2019-07-23 MED ORDER — ISOPROTERENOL HCL 0.2 MG/ML IJ SOLN
INTRAMUSCULAR | Status: AC
  Filled 2019-07-23: qty 5

## 2019-07-23 MED ORDER — FENTANYL CITRATE (PF) 100 MCG/2ML IJ SOLN: 25.0000 ug | INTRAMUSCULAR | Status: DC | PRN

## 2019-07-23 MED ORDER — SODIUM CHLORIDE 0.9% FLUSH: 3.0000 mL | INTRAVENOUS | Status: DC | PRN

## 2019-07-23 MED ORDER — ADENOSINE 6 MG/2ML IV SOLN
INTRAVENOUS | Status: DC | PRN
  Administered 2019-07-23 (×2): 12 mg via INTRAVENOUS

## 2019-07-23 MED ORDER — OXYCODONE HCL 5 MG PO TABS: 5.0000 mg | ORAL_TABLET | Freq: Once | ORAL | Status: DC | PRN

## 2019-07-23 MED ORDER — FENTANYL CITRATE (PF) 250 MCG/5ML IJ SOLN
INTRAMUSCULAR | Status: DC | PRN
  Administered 2019-07-23 (×2): 50 ug via INTRAVENOUS

## 2019-07-23 MED ORDER — ONDANSETRON HCL 4 MG/2ML IJ SOLN: 4.0000 mg | Freq: Four times a day (QID) | INTRAMUSCULAR | Status: DC | PRN

## 2019-07-23 SURGICAL SUPPLY — 19 items
BLANKET WARM UNDERBOD FULL ACC (MISCELLANEOUS) ×2 IMPLANT
CATH 8FR REPROCESSED SOUNDSTAR (CATHETERS) ×2 IMPLANT
CATH MAPPNG PENTARAY F 2-6-2MM (CATHETERS) ×1 IMPLANT
CATH SMTCH THERMOCOOL SF DF (CATHETERS) ×2 IMPLANT
CATH WEBSTER BI DIR CS D-F CRV (CATHETERS) ×2 IMPLANT
COVER SWIFTLINK CONNECTOR (BAG) ×2 IMPLANT
DEVICE CLOSURE PERCLS PRGLD 6F (VASCULAR PRODUCTS) ×3 IMPLANT
NEEDLE BAYLIS TRANSSEPTAL 71CM (NEEDLE) ×2 IMPLANT
PACK EP LATEX FREE (CUSTOM PROCEDURE TRAY) ×2
PACK EP LF (CUSTOM PROCEDURE TRAY) ×1 IMPLANT
PAD PRO RADIOLUCENT 2001M-C (PAD) ×2 IMPLANT
PATCH CARTO3 (PAD) ×2 IMPLANT
PENTARAY F 2-6-2MM (CATHETERS) ×2
PERCLOSE PROGLIDE 6F (VASCULAR PRODUCTS) ×6
SHEATH PINNACLE 7F 10CM (SHEATH) ×4 IMPLANT
SHEATH PINNACLE 9F 10CM (SHEATH) ×2 IMPLANT
SHEATH PROBE COVER 6X72 (BAG) ×2 IMPLANT
SHEATH SWARTZ TS SL2 63CM 8.5F (SHEATH) ×2 IMPLANT
TUBING SMART ABLATE COOLFLOW (TUBING) ×2 IMPLANT

## 2019-07-23 NOTE — H&P (Signed)
Chief Complaint:  afib  History of Present Illness:    Eugene Castaneda is a 74 y.o. male who presents for afib ablation.  He reports initially having symptoms of atrial fibrillation in the late 1980s.  He was eventually diagnosed with afib by Dr Wynonia Lawman.  He underwent afib ablation in 2010 at [facility] by Dr [name].  He had afib post ablation and was placed on flecainide. 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  He uses [employer] and follows his rhythm with this. He is anticoagulated with eliquis. Most episodes are short.  He also has PVCs as well as NSVT. He is active and swims regularly without limitation.  He has symptoms of palpitations with his afib.    Today, he denies symptoms of palpitations, chest pain, shortness of breath, orthopnea, PND, lower extremity edema, claudication, dizziness, presyncope, syncope, bleeding, or neurologic sequela. The patient is tolerating medications without difficulties and is otherwise without complaint today.         Past Medical History:  Diagnosis Date  . CAD (coronary artery disease), native coronary artery 11/13/2017   Coronary calcification noted on CT scan   . Chronic kidney disease (CKD), stage III (moderate) 11/13/2017  . Diplopia   . Foley catheter present    pt has supra-pubic foley catheter in place/since 2016  . GERD (gastroesophageal reflux disease)   . Hyperlipemia   . Hypertension   . Hypertensive heart disease without CHF 11/13/2017  . Hypothyroidism   . Impaired fasting glucose   . MG (myasthenia gravis) ([place])   . Paroxysmal atrial fibrillation ([place]) 11/13/2017   RF ablation Dr. [name] 2010 [facility] CHADSVASC 3  . Prostate cancer ([place]) 2009  . Urethral obstruction   . Urinary retention    Suprapubic Catheter placed 04/25/15         Past Surgical History:  Procedure Laterality Date  . 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 catheter ablationfor afib  2010  . COLONOSCOPY    . PROSTATECTOMY  2009  . TONSILLECTOMY            Current Outpatient Medications  Medication Sig Dispense Refill  . apixaban (ELIQUIS) 5 MG TABS tablet Take 1 tablet (5 mg total) by mouth 2 (two) times daily. 180 tablet 3  . azaTHIOprine (IMURAN) 50 MG tablet Take 2 tablets twice daily. 360 tablet 3  . cholecalciferol (VITAMIN D) 1000 UNITS tablet Take 1,000 Units by mouth daily.    . Fish Oil OIL 1,000 mg by Does not apply route daily.     Marland Kitchen gluconic acid-citric acid (RENACIDIN) irrigation     . Leuprolide Acetate (LUPRON IJ) Inject as directed as needed.     Marland Kitchen levothyroxine (SYNTHROID, LEVOTHROID) 150 MCG tablet Take 1 tablet by mouth daily.    Marland Kitchen lisinopril (PRINIVIL,ZESTRIL) 20 MG tablet Take 20 mg by mouth 2 (two) times a day.     . Multiple Vitamin (MULTIVITAMIN) tablet Take 1 tablet by mouth daily.    Marland Kitchen omeprazole (PRILOSEC) 20 MG capsule Take 20 mg by mouth daily.     Marland Kitchen oxybutynin (DITROPAN-XL) 10 MG 24 hr tablet Take 10 mg by mouth as needed.    . rosuvastatin (CRESTOR) 5 MG tablet Take 2.5 mg by mouth daily.     No current facility-administered medications for this visit.    Allergies:   Patient has no known allergies.  Social History:  The patient  reports that he quit smoking about 39 years ago. He has never used smokeless tobacco. He reports current alcohol use. He reports that he does not use drugs.   Family History:  The patient's family history includes Breast cancer in his mother; Parkinson's disease in his father.    ROS:  Please see the history of present illness.   All other systems are personally reviewed and negative.    Exam:    Vital Signs:  There were no vitals taken for this visit.   Well appearing, alert and conversant, regular work of breathing,  good skin color Eyes- anicteric, neuro- grossly  intact, skin- no apparent rash or lesions or cyanosis, mouth- oral mucosa is pink   Labs/Other Tests and Data Reviewed:    Recent Labs: No results found for requested labs within last 8760 hours.      Wt Readings from Last 3 Encounters:  06/06/19 200 lb 3.2 oz (90.8 kg)  12/25/18 198 lb 3.2 oz (89.9 kg)  10/22/18 199 lb 6.4 oz (90.4 kg)     Other studies personally reviewed: Additional studies/ records that were reviewed today include: Dr Mariea Clonts notes, prior ekgs,   Review of the above records today demonstrates: as above   ASSESSMENT & PLAN:    1.  Paroxysmal atrial fibrillation The patient has symptomatic, recurrent paroxysmal atrial fibrillation. he has failed medical therapy with flecainide.  He underwent afib ablation by Dr [name] in 2010 and has done well initially post ablation but has had recurrence of afib Therapy is limited by bradycardia. Chads2vasc score is 2.  he is anticoagulated with eliquis .  Risk, benefits, and alternatives to EP study and radiofrequency ablation for afib were also discussed in detail today. These risks include but are not limited to stroke, bleeding, vascular damage, tamponade, perforation, damage to the esophagus, lungs, and other structures, pulmonary vein stenosis, worsening renal function, and death. The patient understands these risk and wishes to proceed.  Cardiac Ct reviewed with him at length today.  We discussed calcium score, lifestyle modification, and need to follow up with PCP for lipid management.  He reports compliance with eliquis without interruption.  [name] MD, [place] 07/23/2019 10:32 AM

## 2019-07-23 NOTE — Anesthesia Postprocedure Evaluation (Signed)
Anesthesia Post Note  Patient: Eugene Castaneda  Procedure(s) Performed: ATRIAL FIBRILLATION ABLATION (N/A )     Patient location during evaluation: Cath Lab Anesthesia Type: General Level of consciousness: awake and alert Pain management: pain level controlled Vital Signs Assessment: post-procedure vital signs reviewed and stable Respiratory status: spontaneous breathing, nonlabored ventilation, respiratory function stable and patient connected to nasal cannula oxygen Cardiovascular status: blood pressure returned to baseline and stable Postop Assessment: no apparent nausea or vomiting Anesthetic complications: no   No complications documented.  Last Vitals:  Vitals:   07/23/19 1500 07/23/19 1515  BP: (!) 153/69 (!) 141/65  Pulse: 64   Resp:    Temp:    SpO2: 98%     Last Pain:  Vitals:   07/23/19 1515  TempSrc:   PainSc: 0-No pain                 Mathias Bogacki COKER

## 2019-07-23 NOTE — Anesthesia Preprocedure Evaluation (Addendum)
Anesthesia Evaluation  Patient identified by MRN, date of birth, ID band Patient awake    Reviewed: Allergy & Precautions, NPO status , Patient's Chart, lab work & pertinent test results  History of Anesthesia Complications Negative for: history of anesthetic complications  Airway Mallampati: II  TM Distance: >3 FB Neck ROM: Full    Dental  (+) Dental Advisory Given, Teeth Intact   Pulmonary former smoker,    Pulmonary exam normal        Cardiovascular hypertension, Pt. on medications + CAD  + dysrhythmias Atrial Fibrillation  Rhythm:Irregular Rate:Normal   '21 TTE - EF 60 to 65%. Grade I diastolic dysfunction (impaired relaxation). RV size is mildly enlarged. There is normal pulmonary artery systolic pressure. Trivial MR and AI. There is mild dilatation of the ascending aorta measuring 38 mm    Neuro/Psych  Neuromuscular disease (Myasthenia gravis) negative psych ROS   GI/Hepatic Neg liver ROS, GERD  Medicated and Controlled,  Endo/Other  Hypothyroidism   Renal/GU CRFRenal disease    Urinary retention, suprapubic catheter     Musculoskeletal negative musculoskeletal ROS (+)   Abdominal   Peds  Hematology negative hematology ROS (+)  On eliquis    Anesthesia Other Findings Covid test negative   Reproductive/Obstetrics                            Anesthesia Physical Anesthesia Plan  ASA: III  Anesthesia Plan: General   Post-op Pain Management:    Induction: Intravenous  PONV Risk Score and Plan: 2 and Treatment may vary due to age or medical condition, Ondansetron and Dexamethasone  Airway Management Planned: Oral ETT  Additional Equipment: None  Intra-op Plan:   Post-operative Plan: Extubation in OR  Informed Consent: I have reviewed the patients History and Physical, chart, labs and discussed the procedure including the risks, benefits and alternatives for the  proposed anesthesia with the patient or authorized representative who has indicated his/her understanding and acceptance.     Dental advisory given  Plan Discussed with: CRNA and Anesthesiologist  Anesthesia Plan Comments:        Anesthesia Quick Evaluation

## 2019-07-23 NOTE — Transfer of Care (Signed)
Immediate Anesthesia Transfer of Care Note  Patient: Eugene Castaneda  Procedure(s) Performed: ATRIAL FIBRILLATION ABLATION (N/A )  Patient Location: Cath Lab  Anesthesia Type:General  Level of Consciousness: awake, alert  and oriented  Airway & Oxygen Therapy: Patient Spontanous Breathing and Patient connected to nasal cannula oxygen  Post-op Assessment: Report given to RN and Post -op Vital signs reviewed and stable  Post vital signs: Reviewed and stable  Last Vitals:  Vitals Value Taken Time  BP 145/61 07/23/19 1342  Temp 36.2 C 07/23/19 1342  Pulse 71 07/23/19 1343  Resp 17 07/23/19 1343  SpO2 93 % 07/23/19 1343  Vitals shown include unvalidated device data.  Last Pain:  Vitals:   07/23/19 1342  TempSrc: Tympanic  PainSc: 0-No pain      Patients Stated Pain Goal: 3 (14/27/67 0110)  Complications: No complications documented.

## 2019-07-23 NOTE — Anesthesia Procedure Notes (Signed)
Procedure Name: Intubation Date/Time: 07/23/2019 11:08 AM Performed by: Griffin Dakin, CRNA Pre-anesthesia Checklist: Patient identified, Emergency Drugs available, Suction available and Patient being monitored Patient Re-evaluated:Patient Re-evaluated prior to induction Oxygen Delivery Method: Circle system utilized Preoxygenation: Pre-oxygenation with 100% oxygen Induction Type: IV induction Ventilation: Mask ventilation without difficulty and Oral airway inserted - appropriate to patient size Laryngoscope Size: Glidescope and 4 Grade View: Grade I Tube type: Oral Tube size: 7.5 mm Number of attempts: 1 Airway Equipment and Method: Oral airway,  Video-laryngoscopy and Rigid stylet Placement Confirmation: ETT inserted through vocal cords under direct vision,  positive ETCO2 and breath sounds checked- equal and bilateral Secured at: 24 cm Tube secured with: Tape Dental Injury: Teeth and Oropharynx as per pre-operative assessment

## 2019-07-23 NOTE — Discharge Instructions (Signed)

## 2019-07-24 ENCOUNTER — Encounter (HOSPITAL_COMMUNITY): Payer: Self-pay | Admitting: Internal Medicine

## 2019-07-25 ENCOUNTER — Other Ambulatory Visit (HOSPITAL_COMMUNITY): Payer: Medicare Other

## 2019-08-20 ENCOUNTER — Other Ambulatory Visit: Payer: Self-pay

## 2019-08-20 ENCOUNTER — Ambulatory Visit (HOSPITAL_COMMUNITY)
Admission: RE | Admit: 2019-08-20 | Discharge: 2019-08-20 | Disposition: A | Discharge status: Home / Self Care | Payer: Medicare Other | Source: Ambulatory Visit | Attending: Nurse Practitioner | Admitting: Nurse Practitioner

## 2019-08-20 VITALS — BP 128/68 | HR 48 | Ht 71.0 in | Wt 198.2 lb

## 2019-08-20 DIAGNOSIS — I480 Paroxysmal atrial fibrillation: Secondary | ICD-10-CM | POA: Diagnosis not present

## 2019-08-20 DIAGNOSIS — Z87891 Personal history of nicotine dependence: Secondary | ICD-10-CM | POA: Diagnosis not present

## 2019-08-20 DIAGNOSIS — Z8546 Personal history of malignant neoplasm of prostate: Secondary | ICD-10-CM | POA: Insufficient documentation

## 2019-08-20 DIAGNOSIS — Z79899 Other long term (current) drug therapy: Secondary | ICD-10-CM | POA: Insufficient documentation

## 2019-08-20 DIAGNOSIS — I2510 Atherosclerotic heart disease of native coronary artery without angina pectoris: Secondary | ICD-10-CM | POA: Diagnosis not present

## 2019-08-20 DIAGNOSIS — E785 Hyperlipidemia, unspecified: Secondary | ICD-10-CM | POA: Insufficient documentation

## 2019-08-20 DIAGNOSIS — R7301 Impaired fasting glucose: Secondary | ICD-10-CM | POA: Diagnosis not present

## 2019-08-20 DIAGNOSIS — K219 Gastro-esophageal reflux disease without esophagitis: Secondary | ICD-10-CM | POA: Diagnosis not present

## 2019-08-20 DIAGNOSIS — E039 Hypothyroidism, unspecified: Secondary | ICD-10-CM | POA: Diagnosis not present

## 2019-08-20 DIAGNOSIS — N183 Chronic kidney disease, stage 3 unspecified: Secondary | ICD-10-CM | POA: Diagnosis not present

## 2019-08-20 DIAGNOSIS — I1310 Hypertensive heart and chronic kidney disease without heart failure, with stage 1 through stage 4 chronic kidney disease, or unspecified chronic kidney disease: Secondary | ICD-10-CM | POA: Insufficient documentation

## 2019-08-20 DIAGNOSIS — Z79890 Hormone replacement therapy (postmenopausal): Secondary | ICD-10-CM | POA: Diagnosis not present

## 2019-08-20 DIAGNOSIS — D6869 Other thrombophilia: Secondary | ICD-10-CM | POA: Diagnosis not present

## 2019-08-20 DIAGNOSIS — Z9079 Acquired absence of other genital organ(s): Secondary | ICD-10-CM | POA: Diagnosis not present

## 2019-08-20 DIAGNOSIS — Z960 Presence of urogenital implants: Secondary | ICD-10-CM | POA: Insufficient documentation

## 2019-08-20 DIAGNOSIS — Z7901 Long term (current) use of anticoagulants: Secondary | ICD-10-CM | POA: Diagnosis not present

## 2019-08-20 MED ORDER — DILTIAZEM HCL 30 MG PO TABS
ORAL_TABLET | ORAL | 1 refills | Status: DC
Start: 2019-08-20 — End: 2020-01-20

## 2019-08-20 NOTE — Progress Notes (Signed)
Primary Care Physician: [name], MD Referring Physician: Dr. [name] is a 74 y.o. male with a h/o paroxysmal afib , tachycardia that had repeat ablation with Dr. Rayann Heman one  month ago. Initial afib ablation several years ago with Dr. [name] in [place]. HE had more arrhythmia the first 2 weeks but for the last 2 weeks, less arrhythmia seen by his Chad. He has felt better in the last 2 weeks as well. He is in sinus brady at 48 bpm, asymptomatic. He runs in the 50's at home. No swallowing or groin issues. He had previously been on flecainide. He is not currently on any daily AV nodal agents.    T oday, he denies symptoms of palpitations, chest pain, shortness of breath, orthopnea, PND, lower extremity edema, dizziness, presyncope, syncope, or neurologic sequela. The patient is tolerating medications without difficulties and is otherwise without complaint today.   Past Medical History:  Diagnosis Date  . CAD (coronary artery disease), native coronary artery 11/13/2017   Coronary calcification noted on CT scan   . Chronic kidney disease (CKD), stage III (moderate) 11/13/2017  . Diplopia   . Foley catheter present    pt has supra-pubic foley catheter in place/since 2016  . GERD (gastroesophageal reflux disease)   . Hyperlipemia   . Hypertension   . Hypertensive heart disease without CHF 11/13/2017  . Hypothyroidism   . Impaired fasting glucose   . MG (myasthenia gravis) ([place])   . Paroxysmal atrial fibrillation ([place]) 11/13/2017   RF ablation Dr. [name] 2010 [facility] CHADSVASC 3  . Prostate cancer ([place]) 2009  . Urethral obstruction   . Urinary retention    Suprapubic Catheter placed 04/25/15   Past Surgical History:  Procedure Laterality Date  . 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 ATRIAL FIBRILLATION ABLATION N/A 07/23/2019   Procedure: ATRIAL  FIBRILLATION ABLATION;  Surgeon: [name], MD;  Location: [place] [facility];  Service: Cardiovascular;  Laterality: N/A;  . catheter ablationfor afib  2010  . COLONOSCOPY    . PROSTATECTOMY  2009  . TONSILLECTOMY      Current Outpatient Medications  Medication Sig Dispense Refill  . apixaban (ELIQUIS) 5 MG TABS tablet Take 1 tablet (5 mg total) by mouth 2 (two) times daily. 180 tablet 3  . azaTHIOprine (IMURAN) 50 MG tablet Take 2 tablets twice daily. (Patient taking differently: Take 100 mg by mouth in the morning and at bedtime. ) 360 tablet 3  . cholecalciferol (VITAMIN D) 1000 UNITS tablet Take 1,000 Units by mouth daily.    . Fish Oil OIL Take 1,000 mg by mouth daily.     Marland Kitchen gluconic acid-citric acid (RENACIDIN) irrigation Irrigate with 30 mLs as directed every other day.     Marland Kitchen gluconic acid-citric acid (RENACIDIN) irrigation Irrigate with 30 mLs as directed every other day.    . Incontinence Supplies (CATHETER EXTENSION TUBING) MISC Inject 1 fluid ounce into the vein as needed. Use as directed    . Leuprolide Acetate (LUPRON IJ) Inject as directed as needed (Prostate cancer).     Marland Kitchen levothyroxine (SYNTHROID, LEVOTHROID) 150 MCG tablet Take 150 mcg by mouth daily.     Marland Kitchen lisinopril (PRINIVIL,ZESTRIL) 20 MG tablet Take 20 mg by mouth 2 (two) times a day.     . Multiple Vitamin (MULTIVITAMIN) tablet Take 1 tablet by mouth daily.    Marland Kitchen  omeprazole (PRILOSEC) 20 MG capsule Take 20 mg by mouth daily.     Marland Kitchen oxybutynin (DITROPAN-XL) 10 MG 24 hr tablet Take 10 mg by mouth daily as needed (Bladder spasm).     . rosuvastatin (CRESTOR) 5 MG tablet Take 2.5 mg by mouth daily.    Marland Kitchen diltiazem (CARDIZEM) 30 MG tablet Take one tablet by mouth every 4 hours as needed for heart rate greater than 100 and blood pressure needs to be above 100 30 tablet 1  . Water For Irrigation, Sterile (STERILE WATER FOR IRRIGATION) Irrigate with 1,000,000 mLs as directed as needed. Use as directed     No current  facility-administered medications for this encounter.    No Known Allergies  Social History   Socioeconomic History  . Marital status: Married    Spouse name: Not on file  . Number of children: 2  . Years of education: Masters  . Highest education level: Not on file  Occupational History  . Occupation: Retired  Tobacco Use  . Smoking status: Former Smoker    Quit date: 02/01/1980    Years since quitting: 39.5  . Smokeless tobacco: Never Used  Vaping Use  . Vaping Use: Never used  Substance and Sexual Activity  . Alcohol use: Yes    Alcohol/week: 0.0 standard drinks    Comment: 2 glasses red wine daily  . Drug use: No  . Sexual activity: Not on file  Other Topics Concern  . Not on file  Social History Narrative   Lives in [place] with spouse.   Right-handed.   2 cups caffeine daily.   Retired from Database administrator   Social Determinants of Radio broadcast assistant Strain:   . Difficulty of Paying Living Expenses:   Food Insecurity:   . Worried About Charity fundraiser in the Last Year:   . Arboriculturist in the Last Year:   Transportation Needs:   . Film/video editor (Medical):   Marland Kitchen Lack of Transportation (Non-Medical):   Physical Activity:   . Days of Exercise per Week:   . Minutes of Exercise per Session:   Stress:   . Feeling of Stress :   Social Connections:   . Frequency of Communication with Friends and Family:   . Frequency of Social Gatherings with Friends and Family:   . Attends Religious Services:   . Active Member of Clubs or Organizations:   . Attends Archivist Meetings:   Marland Kitchen Marital Status:   Intimate Partner Violence:   . Fear of Current or Ex-Partner:   . Emotionally Abused:   Marland Kitchen Physically Abused:   . Sexually Abused:     Family History  Problem Relation Age of Onset  . Parkinson's disease Father   . Breast cancer Mother     ROS- All systems are reviewed and negative except as per the HPI  above  Physical Exam: Vitals:   08/20/19 0917  BP: 128/68  Pulse: (!) 48  SpO2: 99%  Weight: 89.9 kg  Height: 5\' 11"  (1.803 m)   Wt Readings from Last 3 Encounters:  08/20/19 89.9 kg  07/23/19 88.5 kg  06/06/19 90.8 kg    Labs: Lab Results  Component Value Date   NA 141 07/03/2019   K 4.9 07/03/2019   CL 101 07/03/2019   CO2 25 07/03/2019   GLUCOSE 115 (H) 07/03/2019   BUN 12 07/03/2019   CREATININE 1.16 07/03/2019   CALCIUM 10.1 07/03/2019  Lab Results  Component Value Date   INR 2.5 (H) 04/03/2008   No results found for: CHOL, HDL, LDLCALC, TRIG   GEN- The patient is well appearing, alert and oriented x 3 today.   Head- normocephalic, atraumatic Eyes-  Sclera clear, conjunctiva pink Ears- hearing intact Oropharynx- clear Neck- supple, no JVP Lymph- no cervical lymphadenopathy Lungs- Clear to ausculation bilaterally, normal work of breathing Heart- Regular rate and rhythm, no murmurs, rubs or gallops, PMI not laterally displaced GI- soft, NT, ND, + BS Extremities- no clubbing, cyanosis, or edema MS- no significant deformity or atrophy Skin- no rash or lesion Psych- euthymic mood, full affect Neuro- strength and sensation are intact  EKG-Sinus brady at 48 bpm, with first degree block,  pr int 220 ms, qrs int 86 ms, qtc 398 ms    Assessment and Plan: 1. Afib  S/p one month ablation Pt did have multiple atrial flutter circuits that could not be ablated  He did notice more arrhythmia in the first 2 weeks, less the last 2 weeks, feeling improved  I will rx 30 mg Cardizem daily in case of prolonged arhythmia, instructions on how to use explained to pt   2. CHA2DS2VASc score of 3( htn, 74 on 10/8, CAD)  Continue eliquis 5 mg bid without interruption  3. HTN Stable     F/u with Dr. Rayann Heman 9/22  Geroge Baseman. Marsean Elkhatib, [facility] [address] [place], [place] [address] [phone]

## 2019-09-09 DIAGNOSIS — C61 Malignant neoplasm of prostate: Secondary | ICD-10-CM | POA: Diagnosis not present

## 2019-09-12 DIAGNOSIS — L298 Other pruritus: Secondary | ICD-10-CM | POA: Diagnosis not present

## 2019-09-12 DIAGNOSIS — L570 Actinic keratosis: Secondary | ICD-10-CM | POA: Diagnosis not present

## 2019-09-12 DIAGNOSIS — Z466 Encounter for fitting and adjustment of urinary device: Secondary | ICD-10-CM | POA: Diagnosis not present

## 2019-09-12 DIAGNOSIS — C61 Malignant neoplasm of prostate: Secondary | ICD-10-CM | POA: Diagnosis not present

## 2019-09-12 DIAGNOSIS — L819 Disorder of pigmentation, unspecified: Secondary | ICD-10-CM | POA: Diagnosis not present

## 2019-09-16 DIAGNOSIS — C61 Malignant neoplasm of prostate: Secondary | ICD-10-CM | POA: Diagnosis not present

## 2019-09-16 DIAGNOSIS — R21 Rash and other nonspecific skin eruption: Secondary | ICD-10-CM | POA: Diagnosis not present

## 2019-09-16 DIAGNOSIS — Z9359 Other cystostomy status: Secondary | ICD-10-CM | POA: Diagnosis not present

## 2019-09-16 DIAGNOSIS — N35013 Post-traumatic anterior urethral stricture: Secondary | ICD-10-CM | POA: Diagnosis not present

## 2019-09-16 DIAGNOSIS — N529 Male erectile dysfunction, unspecified: Secondary | ICD-10-CM | POA: Diagnosis not present

## 2019-09-16 DIAGNOSIS — R339 Retention of urine, unspecified: Secondary | ICD-10-CM | POA: Diagnosis not present

## 2019-09-16 DIAGNOSIS — N320 Bladder-neck obstruction: Secondary | ICD-10-CM | POA: Diagnosis not present

## 2019-10-02 ENCOUNTER — Telehealth (HOSPITAL_COMMUNITY): Payer: Self-pay | Admitting: *Deleted

## 2019-10-02 NOTE — Telephone Encounter (Signed)
Pt called in stating he continues to have daily issues with arrhythmias - bradycardia, tachycardia, afib captured on his kardia - yesterday he had a pre-syncopal episode of feeling very faint and nauseous he is not sure if he tripped or passed all the way out but found himself on the ground. He did not take a kardia strip with this episode. Pt feels well today - went for his normal swimming exercise regimen without difficulty today. Discussed with Roderic Palau NP recommends having follow up with Dr. Rayann Heman asap to further discuss and determine treatment options. Pt verbalized agreement.

## 2019-10-03 ENCOUNTER — Encounter: Payer: Self-pay | Admitting: Internal Medicine

## 2019-10-03 ENCOUNTER — Other Ambulatory Visit: Payer: Self-pay

## 2019-10-03 ENCOUNTER — Ambulatory Visit (INDEPENDENT_AMBULATORY_CARE_PROVIDER_SITE_OTHER): Payer: Medicare Other | Admitting: Internal Medicine

## 2019-10-03 VITALS — BP 142/70 | HR 57 | Ht 71.0 in | Wt 191.0 lb

## 2019-10-03 DIAGNOSIS — R42 Dizziness and giddiness: Secondary | ICD-10-CM | POA: Diagnosis not present

## 2019-10-03 DIAGNOSIS — I480 Paroxysmal atrial fibrillation: Secondary | ICD-10-CM

## 2019-10-03 DIAGNOSIS — I2510 Atherosclerotic heart disease of native coronary artery without angina pectoris: Secondary | ICD-10-CM

## 2019-10-03 DIAGNOSIS — I10 Essential (primary) hypertension: Secondary | ICD-10-CM | POA: Diagnosis not present

## 2019-10-03 DIAGNOSIS — D6869 Other thrombophilia: Secondary | ICD-10-CM

## 2019-10-03 MED ORDER — FLECAINIDE ACETATE 50 MG PO TABS
50.0000 mg | ORAL_TABLET | Freq: Two times a day (BID) | ORAL | 3 refills | Status: DC
Start: 2019-10-03 — End: 2020-06-17

## 2019-10-03 NOTE — Progress Notes (Signed)
PCP: [name], MD  CC: diziness  Eugene Castaneda is a 74 y.o. male who presents today for electrophysiology followup.  Since his recent afib ablation, the patient reports doing reasonably well.  he denies procedure related complications.  He has frequent ERAF.  He has also had dizziness.  + presyncope.  He fell several days ago and thinks that he may have "blacked out".  Today, he denies symptoms of chest pain, shortness of breath, or  lower extremity edema.  The patient is otherwise without complaint today.   Past Medical History:  Diagnosis Date  . CAD (coronary artery disease), native coronary artery 11/13/2017   Coronary calcification noted on CT scan   . Chronic kidney disease (CKD), stage III (moderate) 11/13/2017  . Diplopia   . Foley catheter present    pt has supra-pubic foley catheter in place/since 2016  . GERD (gastroesophageal reflux disease)   . Hyperlipemia   . Hypertension   . Hypertensive heart disease without CHF 11/13/2017  . Hypothyroidism   . Impaired fasting glucose   . MG (myasthenia gravis) ([place])   . Paroxysmal atrial fibrillation ([place]) 11/13/2017   RF ablation Dr. [name] 2010 [facility] CHADSVASC 3  . Prostate cancer ([place]) 2009  . Urethral obstruction   . Urinary retention    Suprapubic Catheter placed 04/25/15   Past Surgical History:  Procedure Laterality Date  . 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 ATRIAL FIBRILLATION ABLATION N/A 07/23/2019   Procedure: ATRIAL FIBRILLATION ABLATION;  Surgeon: [name], MD;  Location: [place] [facility];  Service: Cardiovascular;  Laterality: N/A;  . catheter ablationfor afib  2010  . COLONOSCOPY    . PROSTATECTOMY  2009  . TONSILLECTOMY      ROS- all systems are personally reviewed and negatives except as per HPI above  Current Outpatient Medications  Medication Sig Dispense Refill  .  apixaban (ELIQUIS) 5 MG TABS tablet Take 1 tablet (5 mg total) by mouth 2 (two) times daily. 180 tablet 3  . azaTHIOprine (IMURAN) 50 MG tablet Take 2 tablets twice daily. 360 tablet 3  . cholecalciferol (VITAMIN D) 1000 UNITS tablet Take 1,000 Units by mouth daily.    . clotrimazole-betamethasone (LOTRISONE) cream Apply 1 application topically See admin instructions.    Marland Kitchen diltiazem (CARDIZEM) 30 MG tablet Take one tablet by mouth every 4 hours as needed for heart rate greater than 100 and blood pressure needs to be above 100 30 tablet 1  . Fish Oil OIL Take 1,000 mg by mouth daily.     Marland Kitchen gluconic acid-citric acid (RENACIDIN) irrigation Irrigate with 30 mLs as directed every other day.    . Incontinence Supplies (CATHETER EXTENSION TUBING) MISC Inject 1 fluid ounce into the vein as needed. Use as directed    . Leuprolide Acetate (LUPRON IJ) Inject as directed as needed (Prostate cancer).     Marland Kitchen levothyroxine (SYNTHROID, LEVOTHROID) 150 MCG tablet Take 150 mcg by mouth daily.     Marland Kitchen lisinopril (PRINIVIL,ZESTRIL) 20 MG tablet Take 20 mg by mouth 2 (two) times a day.     . Multiple Vitamin (MULTIVITAMIN) tablet Take 1 tablet by mouth daily.    Marland Kitchen omeprazole (PRILOSEC) 20 MG capsule Take 20 mg by mouth daily.     Marland Kitchen oxybutynin (DITROPAN-XL) 10 MG 24 hr tablet Take 10 mg by mouth daily as needed (Bladder spasm).     Marland Kitchen  rosuvastatin (CRESTOR) 5 MG tablet Take 2.5 mg by mouth daily.    . Water For Irrigation, Sterile (STERILE WATER FOR IRRIGATION) Irrigate with 1,000,000 mLs as directed as needed. Use as directed     No current facility-administered medications for this visit.    Physical Exam: Vitals:   10/03/19 1244  BP: (!) 142/70  Pulse: (!) 57  SpO2: 98%  Weight: 191 lb (86.6 kg)  Height: 5\' 11"  (1.803 m)    GEN- The patient is well appearing, alert and oriented x 3 today.   Head- normocephalic, atraumatic Eyes-  Sclera clear, conjunctiva pink Ears- hearing intact Oropharynx- clear Lungs-  Clear to ausculation bilaterally, normal work of breathing Heart- Regular rate and rhythm, no murmurs, rubs or gallops, PMI not laterally displaced GI- soft, NT, ND, + BS Extremities- no clubbing, cyanosis, or edema  EKG tracing ordered today is personally reviewed and shows sinus rhythm with frequent PACs  Assessment and Plan:  1. Paroxysmal atrial fibrillation Having ERAF s/p ablation chads2vasc score is 2 Start flecainide 50mg  BID  2. HTN Stable No change required today  3. Presyncope  Likely due to an arrhythmia (either RVR or post pac pauses) Start flecainide as above No driving (pt aware)   Return to see me in 2 weeks  [name] MD, [facility] 10/03/2019 12:50 PM

## 2019-10-03 NOTE — Patient Instructions (Addendum)
Medication Instructions:  Your physician has recommended you make the following change in your medication:   1.  Start taking flecainide 50 mg-  Take one tablet by mouth twice a day  No driving  Labwork: None ordered.  Testing/Procedures: None ordered.  Follow-Up: Your physician wants you to follow-up in:  2 weeks with Dr. Rayann Castaneda at the [facility] [address] office October 23, 2019 at 4:15 pm  Any Other Special Instructions Will Be Listed Below (If Applicable).  If you need a refill on your cardiac medications before your next appointment, please call your pharmacy.   Flecainide tablets What is this medicine? FLECAINIDE (FLEK a nide) is an antiarrhythmic drug. This medicine is used to prevent irregular heart rhythm. It can also slow down fast heartbeats called tachycardia. This medicine may be used for other purposes; ask your health care provider or pharmacist if you have questions. COMMON BRAND NAME(S): Tambocor What should I tell my health care provider before I take this medicine? They need to know if you have any of these conditions:  abnormal levels of potassium in the blood  heart disease including heart rhythm and heart rate problems  kidney or liver disease  recent heart attack  an unusual or allergic reaction to flecainide, local anesthetics, other medicines, foods, dyes, or preservatives  pregnant or trying to get pregnant  breast-feeding How should I use this medicine? Take this medicine by mouth with a glass of water. Follow the directions on the prescription label. You can take this medicine with or without food. Take your doses at regular intervals. Do not take your medicine more often than directed. Do not stop taking this medicine suddenly. This may cause serious, heart-related side effects. If your doctor wants you to stop the medicine, the dose may be slowly lowered over time to avoid any side effects. Talk to your pediatrician regarding the use of this medicine  in children. While this drug may be prescribed for children as young as 1 year of age for selected conditions, precautions do apply. Overdosage: If you think you have taken too much of this medicine contact a poison control center or emergency room at once. NOTE: This medicine is only for you. Do not share this medicine with others. What if I miss a dose? If you miss a dose, take it as soon as you can. If it is almost time for your next dose, take only that dose. Do not take double or extra doses. What may interact with this medicine? Do not take this medicine with any of the following medications:  amoxapine  arsenic trioxide  certain antibiotics like clarithromycin, erythromycin, gatifloxacin, gemifloxacin, levofloxacin, moxifloxacin, sparfloxacin, or troleandomycin  certain antidepressants called tricyclic antidepressants like amitriptyline, imipramine, or nortriptyline  certain medicines to control heart rhythm like disopyramide, encainide, moricizine, procainamide, propafenone, and quinidine  cisapride  delavirdine  droperidol  haloperidol  hawthorn  imatinib  levomethadyl  maprotiline  medicines for malaria like chloroquine and halofantrine  pentamidine  phenothiazines like chlorpromazine, mesoridazine, prochlorperazine, thioridazine  pimozide  quinine  ranolazine  ritonavir  sertindole This medicine may also interact with the following medications:  cimetidine  dofetilide  medicines for angina or high blood pressure  medicines to control heart rhythm like amiodarone and digoxin  ziprasidone This list may not describe all possible interactions. Give your health care provider a list of all the medicines, herbs, non-prescription drugs, or dietary supplements you use. Also tell them if you smoke, drink alcohol, or use illegal drugs. Some  items may interact with your medicine. What should I watch for while using this medicine? Visit your doctor or  health care professional for regular checks on your progress. Because your condition and the use of this medicine carries some risk, it is a good idea to carry an identification card, necklace or bracelet with details of your condition, medications and doctor or health care professional. Check your blood pressure and pulse rate regularly. Ask your health care professional what your blood pressure and pulse rate should be, and when you should contact him or her. Your doctor or health care professional also may schedule regular blood tests and electrocardiograms to check your progress. You may get drowsy or dizzy. Do not drive, use machinery, or do anything that needs mental alertness until you know how this medicine affects you. Do not stand or sit up quickly, especially if you are an older patient. This reduces the risk of dizzy or fainting spells. Alcohol can make you more dizzy, increase flushing and rapid heartbeats. Avoid alcoholic drinks. What side effects may I notice from receiving this medicine? Side effects that you should report to your doctor or health care professional as soon as possible:  chest pain, continued irregular heartbeats  difficulty breathing  swelling of the legs or feet  trembling, shaking  unusually weak or tired Side effects that usually do not require medical attention (report to your doctor or health care professional if they continue or are bothersome):  blurred vision  constipation  headache  nausea, vomiting  stomach pain This list may not describe all possible side effects. Call your doctor for medical advice about side effects. You may report side effects to FDA at 1-800-FDA-1088. Where should I keep my medicine? Keep out of the reach of children. Store at room temperature between 15 and 30 degrees C (59 and 86 degrees F). Protect from light. Keep container tightly closed. Throw away any unused medicine after the expiration date. NOTE: This sheet is a  summary. It may not cover all possible information. If you have questions about this medicine, talk to your doctor, pharmacist, or health care provider.  2020 Elsevier/Gold Standard (2018-01-08 11:41:38)

## 2019-10-10 DIAGNOSIS — Z466 Encounter for fitting and adjustment of urinary device: Secondary | ICD-10-CM | POA: Diagnosis not present

## 2019-10-17 ENCOUNTER — Other Ambulatory Visit: Payer: Self-pay | Admitting: Adult Health

## 2019-10-21 ENCOUNTER — Other Ambulatory Visit: Payer: Self-pay | Admitting: Adult Health

## 2019-10-22 ENCOUNTER — Ambulatory Visit (INDEPENDENT_AMBULATORY_CARE_PROVIDER_SITE_OTHER): Payer: Medicare Other | Admitting: Adult Health

## 2019-10-22 ENCOUNTER — Other Ambulatory Visit: Payer: Self-pay

## 2019-10-22 ENCOUNTER — Encounter: Payer: Self-pay | Admitting: Adult Health

## 2019-10-22 VITALS — BP 134/72 | HR 46 | Ht 71.0 in | Wt 190.4 lb

## 2019-10-22 DIAGNOSIS — I2510 Atherosclerotic heart disease of native coronary artery without angina pectoris: Secondary | ICD-10-CM | POA: Diagnosis not present

## 2019-10-22 DIAGNOSIS — G7000 Myasthenia gravis without (acute) exacerbation: Secondary | ICD-10-CM | POA: Diagnosis not present

## 2019-10-22 MED ORDER — AZATHIOPRINE 50 MG PO TABS: ORAL_TABLET | ORAL | 3 refills | Status: DC

## 2019-10-22 NOTE — Patient Instructions (Signed)
Your Plan:  Continue Imuran  If your symptoms worsen or you develop new symptoms please let us know.       Thank you for coming to see [place] at [facility]. I hope we have been able to provide you high quality care today.  You may receive a patient satisfaction survey over the next few weeks. We would appreciate your feedback and comments so that we may continue to improve ourselves and the health of our patients.

## 2019-10-22 NOTE — Progress Notes (Signed)
PATIENT: Eugene Castaneda DOB: 1945-03-12  REASON FOR VISIT: follow up HISTORY FROM: patient  HISTORY OF PRESENT ILLNESS: Today 10/22/19:  Eugene Castaneda is a 74 year old male with a history of myasthenia gravis.  He returns today for follow-up.  He remains on Imuran.  Overall he reports that he has been doing well.  Denies any weakness in the extremities.  Denies any ptosis or diplopia.  Reports that he tries to stay fairly active.  He returns today for an evaluation.  HISTORY 10/22/18:  Eugene Castaneda is a 74 year old male with a history of myasthenia gravis.  He returns today for follow-up.  He remains on Imuran.  Denies any new issues.  He had blood work with his primary care in May.  I have reviewed the blood work and it was relatively unremarkable.  The patient denies any muscle weakness in the extremities.  Denies any changes with his vision or ptosis.  He does report that if he looks to the extreme right or left he may have diplopia.  He also reports a tremor in the hands.  He primarily notices it sometimes with his handwriting or with eating.  Reports that his father had Parkinson's disease.  He returns today for an evaluation.  REVIEW OF SYSTEMS: Out of a complete 14 system review of symptoms, the patient complains only of the following symptoms, and all other reviewed systems are negative.  ALLERGIES: No Known Allergies  HOME MEDICATIONS: Outpatient Medications Prior to Visit  Medication Sig Dispense Refill  . apixaban (ELIQUIS) 5 MG TABS tablet Take 1 tablet (5 mg total) by mouth 2 (two) times daily. 180 tablet 3  . azaTHIOprine (IMURAN) 50 MG tablet Take 2 tablets by mouth twice daily 360 tablet 0  . cholecalciferol (VITAMIN D) 1000 UNITS tablet Take 1,000 Units by mouth daily.    . clotrimazole-betamethasone (LOTRISONE) cream Apply 1 application topically See admin instructions.    Marland Kitchen diltiazem (CARDIZEM) 30 MG tablet Take one tablet by mouth every 4 hours as needed for heart  rate greater than 100 and blood pressure needs to be above 100 30 tablet 1  . Fish Oil OIL Take 1,000 mg by mouth daily.     . flecainide (TAMBOCOR) 50 MG tablet Take 1 tablet (50 mg total) by mouth 2 (two) times daily. 180 tablet 3  . gluconic acid-citric acid (RENACIDIN) irrigation Irrigate with 30 mLs as directed every other day.    . Incontinence Supplies (CATHETER EXTENSION TUBING) MISC Inject 1 fluid ounce into the vein as needed. Use as directed    . Leuprolide Acetate (LUPRON IJ) Inject as directed as needed (Prostate cancer).     Marland Kitchen levothyroxine (SYNTHROID, LEVOTHROID) 150 MCG tablet Take 150 mcg by mouth daily.     Marland Kitchen lisinopril (PRINIVIL,ZESTRIL) 20 MG tablet Take 20 mg by mouth 2 (two) times a day.     . Multiple Vitamin (MULTIVITAMIN) tablet Take 1 tablet by mouth daily.    Marland Kitchen omeprazole (PRILOSEC) 20 MG capsule Take 20 mg by mouth daily.     Marland Kitchen oxybutynin (DITROPAN-XL) 10 MG 24 hr tablet Take 10 mg by mouth daily as needed (Bladder spasm).     . rosuvastatin (CRESTOR) 5 MG tablet Take 2.5 mg by mouth daily.    . Water For Irrigation, Sterile (STERILE WATER FOR IRRIGATION) Irrigate with 1,000,000 mLs as directed as needed. Use as directed     No facility-administered medications prior to visit.    PAST MEDICAL HISTORY: Past  Medical History:  Diagnosis Date  . CAD (coronary artery disease), native coronary artery 11/13/2017   Coronary calcification noted on CT scan   . Chronic kidney disease (CKD), stage III (moderate) 11/13/2017  . Diplopia   . Foley catheter present    pt has supra-pubic foley catheter in place/since 2016  . GERD (gastroesophageal reflux disease)   . Hyperlipemia   . Hypertension   . Hypertensive heart disease without CHF 11/13/2017  . Hypothyroidism   . Impaired fasting glucose   . MG (myasthenia gravis) ([place])   . Paroxysmal atrial fibrillation ([place]) 11/13/2017   RF ablation Dr. [name] 2010 [facility] CHADSVASC 3  . Prostate cancer ([place]) 2009  .  Urethral obstruction   . Urinary retention    Suprapubic Catheter placed 04/25/15    PAST SURGICAL HISTORY: Past Surgical History:  Procedure Laterality Date  . 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 ATRIAL FIBRILLATION ABLATION N/A 07/23/2019   Procedure: ATRIAL FIBRILLATION ABLATION;  Surgeon: [name], MD;  Location: [place] [facility];  Service: Cardiovascular;  Laterality: N/A;  . catheter ablationfor afib  2010  . COLONOSCOPY    . PROSTATECTOMY  2009  . TONSILLECTOMY      FAMILY HISTORY: Family History  Problem Relation Age of Onset  . Parkinson's disease Father   . Breast cancer Mother     SOCIAL HISTORY: Social History   Socioeconomic History  . Marital status: Married    Spouse name: Not on file  . Number of children: 2  . Years of education: Masters  . Highest education level: Not on file  Occupational History  . Occupation: Retired  Tobacco Use  . Smoking status: Former Smoker    Quit date: 02/01/1980    Years since quitting: 39.7  . Smokeless tobacco: Never Used  Vaping Use  . Vaping Use: Never used  Substance and Sexual Activity  . Alcohol use: Yes    Alcohol/week: 0.0 standard drinks    Comment: 2 glasses red wine daily  . Drug use: No  . Sexual activity: Not on file  Other Topics Concern  . Not on file  Social History Narrative   Lives in [place] with spouse.   Right-handed.   2 cups caffeine daily.   Retired from Database administrator   Social Determinants of Radio broadcast assistant Strain:   . Difficulty of Paying Living Expenses: Not on file  Food Insecurity:   . Worried About Charity fundraiser in the Last Year: Not on file  . Ran Out of Food in the Last Year: Not on file  Transportation Needs:   . Lack of Transportation (Medical): Not on file  . Lack of Transportation (Non-Medical): Not on file  Physical  Activity:   . Days of Exercise per Week: Not on file  . Minutes of Exercise per Session: Not on file  Stress:   . Feeling of Stress : Not on file  Social Connections:   . Frequency of Communication with Friends and Family: Not on file  . Frequency of Social Gatherings with Friends and Family: Not on file  . Attends Religious Services: Not on file  . Active Member of Clubs or Organizations: Not on file  . Attends Archivist Meetings: Not on file  . Marital Status: Not on file  Intimate Partner Violence:   . Fear of Current or Ex-Partner:  Not on file  . Emotionally Abused: Not on file  . Physically Abused: Not on file  . Sexually Abused: Not on file      PHYSICAL EXAM  Vitals:   10/22/19 0845  BP: 134/72  Pulse: (!) 46  Weight: 190 lb 6.4 oz (86.4 kg)  Height: 5\' 11"  (1.803 m)   Body mass index is 26.56 kg/m.  Generalized: Well developed, in no acute distress   Neurological examination  Mentation: Alert oriented to time, place, history taking. Follows all commands speech and language fluent Cranial nerve II-XII: Pupils were equal round reactive to light. Extraocular movements were full, visual field were full on confrontational test. Head turning and shoulder shrug  were normal and symmetric.  With superior gaze held for 1 minute no ptosis or diplopia noted Motor: The motor testing reveals 5 over 5 strength of all 4 extremities. Good symmetric motor tone is noted throughout.  With arms abducted for 1 minute no weakness noted Sensory: Sensory testing is intact to soft touch on all 4 extremities. No evidence of extinction is noted.  Coordination: Cerebellar testing reveals good finger-nose-finger and heel-to-shin bilaterally.  Gait and station: Gait is normal.  Reflexes: Deep tendon reflexes are symmetric and normal bilaterally.   DIAGNOSTIC DATA (LABS, IMAGING, TESTING) - I reviewed patient records, labs, notes, testing and imaging myself where available.  Lab  Results  Component Value Date   WBC 5.0 07/03/2019   HGB 14.0 07/03/2019   HCT 41.3 07/03/2019   MCV 93 07/03/2019   PLT 287 07/03/2019      Component Value Date/Time   NA 141 07/03/2019 0815   K 4.9 07/03/2019 0815   CL 101 07/03/2019 0815   CO2 25 07/03/2019 0815   GLUCOSE 115 (H) 07/03/2019 0815   GLUCOSE 89 04/02/2008 1215   BUN 12 07/03/2019 0815   CREATININE 1.16 07/03/2019 0815   CALCIUM 10.1 07/03/2019 0815   PROT 6.6 10/08/2015 1034   ALBUMIN 4.4 10/08/2015 1034   AST 92 (H) 10/08/2015 1034   ALT 71 (H) 10/08/2015 1034   ALKPHOS 62 10/08/2015 1034   BILITOT 1.0 10/08/2015 1034   GFRNONAA 62 07/03/2019 0815   GFRAA 71 07/03/2019 0815   No results found for: CHOL, HDL, LDLCALC, LDLDIRECT, TRIG, CHOLHDL Lab Results  Component Value Date   HGBA1C 6.2 (H) 04/07/2015   No results found for: VITAMINB12 No results found for: TSH    ASSESSMENT AND PLAN 74 y.o. year old male  has a past medical history of CAD (coronary artery disease), native coronary artery (11/13/2017), Chronic kidney disease (CKD), stage III (moderate) (11/13/2017), Diplopia, Foley catheter present, GERD (gastroesophageal reflux disease), Hyperlipemia, Hypertension, Hypertensive heart disease without CHF (11/13/2017), Hypothyroidism, Impaired fasting glucose, MG (myasthenia gravis) ([place]), Paroxysmal atrial fibrillation ([place]) (11/13/2017), Prostate cancer ([place]) (2009), Urethral obstruction, and Urinary retention. here with:  1.  Myasthenia gravis   Continue Imuran  Recent blood work in June that was relatively unremarkable  Follow-up in 1 year or sooner if needed   I spent 20 minutes of face-to-face and non-face-to-face time with patient.  This included previsit chart review, lab review, study review, order entry, electronic health record documentation, patient education.  Ward Givens, MSN, NP-C 10/22/2019, 8:59 AM [facility] [address] [place], [place]  [address] [phone]

## 2019-10-23 ENCOUNTER — Encounter: Payer: Self-pay | Admitting: Internal Medicine

## 2019-10-23 ENCOUNTER — Ambulatory Visit (INDEPENDENT_AMBULATORY_CARE_PROVIDER_SITE_OTHER): Payer: Medicare Other | Admitting: Internal Medicine

## 2019-10-23 ENCOUNTER — Ambulatory Visit: Payer: Medicare Other | Admitting: Internal Medicine

## 2019-10-23 ENCOUNTER — Other Ambulatory Visit: Payer: Self-pay

## 2019-10-23 VITALS — BP 138/68 | HR 63 | Ht 71.0 in | Wt 190.6 lb

## 2019-10-23 DIAGNOSIS — I10 Essential (primary) hypertension: Secondary | ICD-10-CM

## 2019-10-23 DIAGNOSIS — I480 Paroxysmal atrial fibrillation: Secondary | ICD-10-CM | POA: Diagnosis not present

## 2019-10-23 DIAGNOSIS — I2510 Atherosclerotic heart disease of native coronary artery without angina pectoris: Secondary | ICD-10-CM | POA: Diagnosis not present

## 2019-10-23 NOTE — Progress Notes (Signed)
I have reviewed and agreed above plan.  It will be helpful to document imuran dose in note, chart reviewed, imuran 50mg  2 tabs bid now

## 2019-10-23 NOTE — Patient Instructions (Addendum)
Medication Instructions:  Your physician recommends that you continue on your current medications as directed. Please refer to the Current Medication list given to you today.  *If you need a refill on your cardiac medications before your next appointment, please call your pharmacy*  Lab Work: None ordered.  If you have labs (blood work) drawn today and your tests are completely normal, you will receive your results only by: Marland Kitchen MyChart Message (if you have MyChart) OR . A paper copy in the mail If you have any lab test that is abnormal or we need to change your treatment, we will call you to review the results.  Testing/Procedures: None ordered.  Follow-Up: At [facility], you and your health needs are our priority.  As part of our continuing mission to provide you with exceptional heart care, we have created designated Provider Care Teams.  These Care Teams include your primary Cardiologist (physician) and Advanced Practice Providers (APPs -  Physician Assistants and Nurse Practitioners) who all work together to provide you with the care you need, when you need it.  We recommend signing up for the patient portal called "MyChart".  Sign up information is provided on this After Visit Summary.  MyChart is used to connect with patients for Virtual Visits (Telemedicine).  Patients are able to view lab/test results, encounter notes, upcoming appointments, etc.  Non-urgent messages can be sent to your provider as well.   To learn more about what you can do with MyChart, go to [email or web address].    Your next appointment:   Your physician wants you to follow-up in: 01/20/2020 at 11 am    Other Instructions:

## 2019-10-23 NOTE — Progress Notes (Signed)
PCP: [name], MD Primary Cardiologist: Dr Harrell Gave Primary EP: Dr Rayann Heman  Eugene Castaneda is a 74 y.o. male who presents today for routine electrophysiology followup.  Since last being seen in our clinic, the patient reports doing very well.  He is pleased with current state.  Today, he denies symptoms of palpitations, chest pain, shortness of breath,  lower extremity edema, dizziness, presyncope, or syncope.  The patient is otherwise without complaint today.   Past Medical History:  Diagnosis Date  . CAD (coronary artery disease), native coronary artery 11/13/2017   Coronary calcification noted on CT scan   . Chronic kidney disease (CKD), stage III (moderate) 11/13/2017  . Diplopia   . Foley catheter present    pt has supra-pubic foley catheter in place/since 2016  . GERD (gastroesophageal reflux disease)   . Hyperlipemia   . Hypertension   . Hypertensive heart disease without CHF 11/13/2017  . Hypothyroidism   . Impaired fasting glucose   . MG (myasthenia gravis) ([place])   . Paroxysmal atrial fibrillation ([place]) 11/13/2017   RF ablation Dr. [name] 2010 [facility] CHADSVASC 3  . Prostate cancer ([place]) 2009  . Urethral obstruction   . Urinary retention    Suprapubic Catheter placed 04/25/15   Past Surgical History:  Procedure Laterality Date  . 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 ATRIAL FIBRILLATION ABLATION N/A 07/23/2019   Procedure: ATRIAL FIBRILLATION ABLATION;  Surgeon: [name], MD;  Location: [place] [facility];  Service: Cardiovascular;  Laterality: N/A;  . catheter ablationfor afib  2010  . COLONOSCOPY    . PROSTATECTOMY  2009  . TONSILLECTOMY      ROS- all systems are reviewed and negatives except as per HPI above  Current Outpatient Medications  Medication Sig Dispense Refill  . apixaban (ELIQUIS) 5 MG TABS tablet Take 1 tablet (5 mg  total) by mouth 2 (two) times daily. 180 tablet 3  . azaTHIOprine (IMURAN) 50 MG tablet Take 2 tablets by mouth twice daily 360 tablet 3  . cholecalciferol (VITAMIN D) 1000 UNITS tablet Take 1,000 Units by mouth daily.    . clotrimazole-betamethasone (LOTRISONE) cream Apply 1 application topically See admin instructions.    Marland Kitchen diltiazem (CARDIZEM) 30 MG tablet Take one tablet by mouth every 4 hours as needed for heart rate greater than 100 and blood pressure needs to be above 100 30 tablet 1  . Fish Oil OIL Take 1,000 mg by mouth daily.     . flecainide (TAMBOCOR) 50 MG tablet Take 1 tablet (50 mg total) by mouth 2 (two) times daily. 180 tablet 3  . gluconic acid-citric acid (RENACIDIN) irrigation Irrigate with 30 mLs as directed every other day.    . Incontinence Supplies (CATHETER EXTENSION TUBING) MISC Inject 1 fluid ounce into the vein as needed. Use as directed    . Leuprolide Acetate (LUPRON IJ) Inject as directed as needed (Prostate cancer).     Marland Kitchen levothyroxine (SYNTHROID, LEVOTHROID) 150 MCG tablet Take 150 mcg by mouth daily.     Marland Kitchen lisinopril (PRINIVIL,ZESTRIL) 20 MG tablet Take 20 mg by mouth 2 (two) times a day.     . Multiple Vitamin (MULTIVITAMIN) tablet Take 1 tablet by mouth daily.    Marland Kitchen omeprazole (PRILOSEC) 20 MG capsule Take 20 mg by mouth daily.     Marland Kitchen oxybutynin (DITROPAN-XL) 10 MG 24 hr tablet Take 10 mg by  mouth daily as needed (Bladder spasm).     . rosuvastatin (CRESTOR) 5 MG tablet Take 2.5 mg by mouth daily.    . Water For Irrigation, Sterile (STERILE WATER FOR IRRIGATION) Irrigate with 1,000,000 mLs as directed as needed. Use as directed     No current facility-administered medications for this visit.    Physical Exam: Vitals:   10/23/19 1632  BP: 138/68  Pulse: 63  SpO2: 99%  Weight: 190 lb 9.6 oz (86.5 kg)  Height: 5\' 11"  (1.803 m)    GEN- The patient is well appearing, alert and oriented x 3 today.   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 Paroxysmal atrial fibrillation He had ERAF s/p ablation He underwent prior AF ablation by Dr [name] in 2010. I started flecainide 10/03/19.  He has done very well since then.  No arrhythmias by his [facility] which I personally reviewed with him today.  We will follow him closely on flecainide to avoid toxicity with this medicine. chads2vasc score is 2. He is on eliquis  2. Presyncope resolved  3. HTN Stable No change required today  Risks, benefits and potential toxicities for medications prescribed and/or refilled reviewed with patient today.   Return to see me in 3 months  [name] MD, [facility] 10/23/2019 4:54 PM

## 2019-10-31 DIAGNOSIS — Z23 Encounter for immunization: Secondary | ICD-10-CM | POA: Diagnosis not present

## 2019-11-02 DIAGNOSIS — Z23 Encounter for immunization: Secondary | ICD-10-CM | POA: Diagnosis not present

## 2019-11-07 ENCOUNTER — Telehealth: Payer: Self-pay | Admitting: *Deleted

## 2019-11-07 DIAGNOSIS — Z466 Encounter for fitting and adjustment of urinary device: Secondary | ICD-10-CM | POA: Diagnosis not present

## 2019-11-07 NOTE — Telephone Encounter (Signed)
A message was left, re: his follow up visit. 

## 2019-12-04 ENCOUNTER — Encounter: Payer: Self-pay | Admitting: Cardiology

## 2019-12-04 ENCOUNTER — Other Ambulatory Visit: Payer: Self-pay

## 2019-12-04 ENCOUNTER — Ambulatory Visit (INDEPENDENT_AMBULATORY_CARE_PROVIDER_SITE_OTHER): Payer: Medicare Other | Admitting: Cardiology

## 2019-12-04 VITALS — BP 124/78 | HR 60 | Ht 70.5 in | Wt 190.8 lb

## 2019-12-04 DIAGNOSIS — R5383 Other fatigue: Secondary | ICD-10-CM | POA: Diagnosis not present

## 2019-12-04 DIAGNOSIS — I480 Paroxysmal atrial fibrillation: Secondary | ICD-10-CM

## 2019-12-04 DIAGNOSIS — I2510 Atherosclerotic heart disease of native coronary artery without angina pectoris: Secondary | ICD-10-CM | POA: Diagnosis not present

## 2019-12-04 DIAGNOSIS — I10 Essential (primary) hypertension: Secondary | ICD-10-CM

## 2019-12-04 DIAGNOSIS — D6869 Other thrombophilia: Secondary | ICD-10-CM | POA: Diagnosis not present

## 2019-12-04 NOTE — Progress Notes (Signed)
Cardiology Office Note:    Date:  12/04/2019   ID:  Eugene Castaneda, DOB 1945/05/20, MRN [id number]  PCP:  [name], MD  Cardiologist:  Buford Dresser, MD   Referring MD: [name], MD   CC: follow up  History of Present Illness:    Eugene Castaneda is a 74 y.o. male with a hx of paroxysmal atrial fibrillation, hypertension, CAD without angina, sick sinus syndrome, hypothyroidism, hyperlipidemia who is seen for follow up. I initially saw him 11/27/2017  as a new patient to [facility] (previously followed by [name]) at the request of [name], [name], MD for the evaluation and management of paroxysmal afib.   Cardiac history: paroxysmal afib, on flecainide "for many years," reports prior treadmill tests that I do not have access to. Has declined anticoagulation in the past but was amenable to staring 01/2019. Uses [employer] intermittently to evaluate for afib.  Today: Has been seen by Dr. Rayann Heman since our last visit. Doing very well on flecainide. Brings extensive logs with him today. Had ablation on 07/23/19 with ERAF after. Started the flecainide again 10/2019 and has had less afib (though still occasional). Only brief episodes of afib, though can have several in a day on occasion.  No loss of consciousness that he knows of. Had one episode where he fell bending over, does not think he blacked out, recovered quickly. Mows the lawn with a push mower, takes about an hour, has to stop about three times. But can swim uninterrupted three days/week. Not specifically shortness of breath that stops him, just feels tired. No chest pain. No leg swelling.  Has noted occasional drops in blood pressure if he is being active and then stands for a time. Only one episode where BP systolic number was <798.   Heart rates largely in the 60s, has remained above 50.   Brings lifeline screening results with him since 2018. We reviewed these today. No high risk findings. We  reviewed fish oil guidelines and recommendations; discussed cardiovascular perspective. He has a history of prostate cancer, and he has heard that fish oil can be beneficial for this. I recommended that he discuss this with Dr. Dagmar Hait.  He continues to have fatigue on exertion. We discussed potential cardiovascular reasons for this. After shared decision making, we will pursue a treadmill stress test to evaluate heart rate, blood pressure, and rhythm with exertion.    Past Medical History:  Diagnosis Date  . CAD (coronary artery disease), native coronary artery 11/13/2017   Coronary calcification noted on CT scan   . Chronic kidney disease (CKD), stage III (moderate) ([place]) 11/13/2017  . Diplopia   . Foley catheter present    pt has supra-pubic foley catheter in place/since 2016  . GERD (gastroesophageal reflux disease)   . Hyperlipemia   . Hypertension   . Hypertensive heart disease without CHF 11/13/2017  . Hypothyroidism   . Impaired fasting glucose   . MG (myasthenia gravis) ([place])   . Paroxysmal atrial fibrillation ([place]) 11/13/2017   RF ablation Dr. [name] 2010 [facility] CHADSVASC 3  . Prostate cancer ([place]) 2009  . Urethral obstruction   . Urinary retention    Suprapubic Catheter placed 04/25/15    Past Surgical History:  Procedure Laterality Date  . 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 ATRIAL FIBRILLATION ABLATION N/A 07/23/2019   Procedure: ATRIAL FIBRILLATION ABLATION;  Surgeon: [name],  MD;  Location: [place] [facility];  Service: Cardiovascular;  Laterality: N/A;  . catheter ablationfor afib  2010  . COLONOSCOPY    . PROSTATECTOMY  2009  . TONSILLECTOMY      Current Medications: Current Outpatient Medications on File Prior to Visit  Medication Sig  . apixaban (ELIQUIS) 5 MG TABS tablet Take 1 tablet (5 mg total) by mouth 2 (two) times daily.  Marland Kitchen azaTHIOprine  (IMURAN) 50 MG tablet Take 2 tablets by mouth twice daily  . cholecalciferol (VITAMIN D3) 25 MCG (1000 UNIT) tablet Take 1,000 Units by mouth daily. 1 Tablet Daily  . clotrimazole-betamethasone (LOTRISONE) cream Apply 1 application topically See admin instructions.  Marland Kitchen diltiazem (CARDIZEM) 30 MG tablet Take one tablet by mouth every 4 hours as needed for heart rate greater than 100 and blood pressure needs to be above 100  . Fish Oil OIL Take 1,000 mg by mouth daily.   . flecainide (TAMBOCOR) 50 MG tablet Take 1 tablet (50 mg total) by mouth 2 (two) times daily.  Marland Kitchen gluconic acid-citric acid (RENACIDIN) irrigation Irrigate with 30 mLs as directed every other day.  . Incontinence Supplies (CATHETER EXTENSION TUBING) MISC Inject 1 fluid ounce into the vein as needed. Use as directed  . Leuprolide Acetate (LUPRON IJ) Inject as directed as needed (Prostate cancer).   Marland Kitchen levothyroxine (SYNTHROID, LEVOTHROID) 150 MCG tablet Take 150 mcg by mouth daily.   Marland Kitchen lisinopril (PRINIVIL,ZESTRIL) 20 MG tablet Take 20 mg by mouth 2 (two) times a day.   . Multiple Vitamin (MULTIVITAMIN) tablet Take 1 tablet by mouth daily.  Marland Kitchen omeprazole (PRILOSEC) 20 MG capsule Take 20 mg by mouth daily.   Marland Kitchen oxybutynin (DITROPAN-XL) 10 MG 24 hr tablet Take 10 mg by mouth daily as needed (Bladder spasm).   . rosuvastatin (CRESTOR) 5 MG tablet Take 2.5 mg by mouth daily.  . Water For Irrigation, Sterile (STERILE WATER FOR IRRIGATION) Irrigate with 1,000,000 mLs as directed as needed. Use as directed   No current facility-administered medications on file prior to visit.     Allergies:   Patient has no known allergies.   Social History   Tobacco Use  . Smoking status: Former Smoker    Quit date: 02/01/1980    Years since quitting: 39.8  . Smokeless tobacco: Never Used  Vaping Use  . Vaping Use: Never used  Substance Use Topics  . Alcohol use: Yes    Alcohol/week: 0.0 standard drinks    Comment: 2 glasses red wine daily  .  Drug use: No    Family History: The patient's family history includes Breast cancer in his mother; Parkinson's disease in his father.  ROS:   Please see the history of present illness.  Additional pertinent ROS otherwise unremarkable.   EKGs/Labs/Other Studies Reviewed:    The following studies were reviewed today: Per Dr. Thurman Coyer notes: treadmill myoview, 04/18/2013.  He did 9 minutes, up to stage II Bruce, for a workload total of 10 METs. Heart rate went from 62 bpm to 144 bpm. No significant ST changes or arrhythmia noted. Imaging suggests a low risk study, with inferolateral ischemia vs more likely artifact, EF 78%.  Echo 10/12/2015 Mild cLVH with normal WM. EF 55% Moderate LAE  Mild RAE Trace AR Mild MR Trace TR and PR IVC dilated, <50% RC  RF ablation 03/2008 Treadmill 05/2003, 03/2008, 03/2013 Echo 03/2003  EKG:  EKG is personally reviewed today.  The ekg ordered 10/23/19 demonstrates sinus rhythm, 1st degree AV  block, incomplete RBBB at rate of 63 bpm.  Recent Labs: 07/03/2019: BUN 12; Creatinine, Ser 1.16; Hemoglobin 14.0; Platelets 287; Potassium 4.9; Sodium 141  Recent Lipid Panel No results found for: CHOL, TRIG, HDL, CHOLHDL, VLDL, LDLCALC, LDLDIRECT   Physical Exam:    VS:  BP 124/78   Pulse 60   Ht 5' 10.5" (1.791 m)   Wt 190 lb 12.8 oz (86.5 kg)   SpO2 98%   BMI 26.99 kg/m     Wt Readings from Last 3 Encounters:  12/04/19 190 lb 12.8 oz (86.5 kg)  10/23/19 190 lb 9.6 oz (86.5 kg)  10/22/19 190 lb 6.4 oz (86.4 kg)    GEN: Well nourished, well developed in no acute distress HEENT: Normal, moist mucous membranes NECK: No JVD CARDIAC: regular rhythm, normal S1 and S2, no rubs or gallops. No murmur. VASCULAR: Radial and DP pulses 2+ bilaterally. No carotid bruits RESPIRATORY:  Clear to auscultation without rales, wheezing or rhonchi  ABDOMEN: Soft, non-tender, non-distended MUSCULOSKELETAL:  Ambulates independently SKIN: Warm and dry, no edema NEUROLOGIC:   Alert and oriented x 3. No focal neuro deficits noted. PSYCHIATRIC:  Normal affect   ASSESSMENT:    1. Fatigue, unspecified type   2. Paroxysmal atrial fibrillation (HCC)   3. Essential hypertension   4. Secondary hypercoagulable state ([place])   5. Coronary artery calcification seen on CT scan    PLAN:    Fatigue, most notably on exertion: -no chest pain or specific shortness of breath -we discussed options for further evaluation -will pursue exercise treadmill test to evaluation heart rate, blood pressure, and rhythm response to exercise  Paroxysmal atrial fibrillation: with secondary hypercoagulable state -CHA2DS2/VAS Stroke Risk Points=3. -doing well on apixaban, no significant bleeding episodes -now follows with Dr. Rayann Heman, s/p ablation in 07/2019, restart of flecainide 10/2019, being monitored by EP clinic -has PRN diltiazem as well   Hypertension: well controlled today  -continue lisinopril 20 mg BID (per patient preference, can be taken once daily)  Hyperlipidemia Coronary calcium on imaging -continue rosuvastatin, taking 2.5 mg daily -low risk stress test 2015, asymptomatic.  -at goal of LDL <70  CV risk and prevention counseling: -recommend heart healthy/Mediterranean diet, with whole grains, fruits, vegetable, fish, lean meats, nuts, and olive oil. Limit salt. -recommend moderate walking, 3-5 times/week for 30-50 minutes each session. Aim for at least 150 minutes.week. Goal should be pace of 3 miles/hours, or walking 1.5 miles in 30 minutes -recommend avoidance of tobacco products. Avoid excess alcohol.  Plan for follow up: 6 mos or sooner with me as needed  Total time of encounter: 46 minutes total time of encounter, including 26 minutes spent in face-to-face patient care. This time includes coordination of care and counseling regarding medication review, log review, guidelines, and next step. Remainder of non-face-to-face time involved reviewing chart  documents/testing relevant to the patient encounter and documentation in the medical record.  Buford Dresser, MD, PhD [place]  [facility]   Medication Adjustments/Labs and Tests Ordered: Current medicines are reviewed at length with the patient today.  Concerns regarding medicines are outlined above.  Orders Placed This Encounter  Procedures  . EXERCISE TOLERANCE TEST (ETT)   No orders of the defined types were placed in this encounter.   Patient Instructions  Medication Instructions:  Your Physician recommend you continue on your current medication as directed.    *If you need a refill on your cardiac medications before your next appointment, please call your pharmacy*   Lab  Work: None   Testing/Procedures: Your physician has requested that you have an exercise tolerance test. For further information please visit [email or web address]. Please also follow instruction sheet, as given. [place]  You will need to have the coronavirus test completed prior to your procedure. This is a Drive Up Visit at [address], [place], [place] [address]. Please tell them that you are there for procedure testing. Stay in your car and someone will be with you shortly. Please make sure to have all other labs completed before this test because you will need to stay quarantined until your procedure.    Follow-Up: At [facility], you and your health needs are our priority.  As part of our continuing mission to provide you with exceptional heart care, we have created designated Provider Care Teams.  These Care Teams include your primary Cardiologist (physician) and Advanced Practice Providers (APPs -  Physician Assistants and Nurse Practitioners) who all work together to provide you with the care you need, when you need it.  We recommend signing up for the patient portal called "MyChart".  Sign up information is provided on this After Visit Summary.  MyChart  is used to connect with patients for Virtual Visits (Telemedicine).  Patients are able to view lab/test results, encounter notes, upcoming appointments, etc.  Non-urgent messages can be sent to your provider as well.   To learn more about what you can do with MyChart, go to [email or web address].    Your next appointment:   6 month(s)  The format for your next appointment:   In Person  Provider:   Buford Dresser, MD    [facility] at [facility] [address] [place], [place] [address] Phone:  [phone]        You are scheduled for an Exercise Stress Test  Please arrive 15 minutes prior to your appointment time for registration and insurance purposes.  The test will take approximately 45 minutes to complete.  How to prepare for your Exercise Stress Test: . Do bring a list of your current medications with you.  If not listed below, you may take your medications as normal. . Do wear comfortable clothes (no dresses or overalls) and walking shoes, tennis shoes preferred (no heels or open toed shoes are allowed) . Do Not wear cologne, perfume, aftershave or lotions (deodorant is allowed). . Please report to [place], [address] for your test.  If these instructions are not followed, your test will have to be rescheduled.  If you have questions or concerns about your appointment, you can call the Stress Lab at [phone].  If you cannot keep your appointment, please provide 24 hours notification to the Stress Lab, to avoid a possible $50 charge to your account     Signed, Buford Dresser, MD PhD 12/04/2019 9:46 AM    [place]

## 2019-12-04 NOTE — Patient Instructions (Signed)
Medication Instructions:  Your Physician recommend you continue on your current medication as directed.    *If you need a refill on your cardiac medications before your next appointment, please call your pharmacy*   Lab Work: None   Testing/Procedures: Your physician has requested that you have an exercise tolerance test. For further information please visit [email or web address]. Please also follow instruction sheet, as given. [place]  You will need to have the coronavirus test completed prior to your procedure. This is a Drive Up Visit at [address], [place], [place] [address]. Please tell them that you are there for procedure testing. Stay in your car and someone will be with you shortly. Please make sure to have all other labs completed before this test because you will need to stay quarantined until your procedure.    Follow-Up: At [facility], you and your health needs are our priority.  As part of our continuing mission to provide you with exceptional heart care, we have created designated Provider Care Teams.  These Care Teams include your primary Cardiologist (physician) and Advanced Practice Providers (APPs -  Physician Assistants and Nurse Practitioners) who all work together to provide you with the care you need, when you need it.  We recommend signing up for the patient portal called "MyChart".  Sign up information is provided on this After Visit Summary.  MyChart is used to connect with patients for Virtual Visits (Telemedicine).  Patients are able to view lab/test results, encounter notes, upcoming appointments, etc.  Non-urgent messages can be sent to your provider as well.   To learn more about what you can do with MyChart, go to [email or web address].    Your next appointment:   6 month(s)  The format for your next appointment:   In Person  Provider:   Buford Dresser, MD    [facility] at [facility] [address] [place], [place] [address] Phone:  [phone]        You are scheduled for an Exercise Stress Test  Please arrive 15 minutes prior to your appointment time for registration and insurance purposes.  The test will take approximately 45 minutes to complete.  How to prepare for your Exercise Stress Test:  Do bring a list of your current medications with you.  If not listed below, you may take your medications as normal.  Do wear comfortable clothes (no dresses or overalls) and walking shoes, tennis shoes preferred (no heels or open toed shoes are allowed)  Do Not wear cologne, perfume, aftershave or lotions (deodorant is allowed).  Please report to [place], [address] for your test.  If these instructions are not followed, your test will have to be rescheduled.  If you have questions or concerns about your appointment, you can call the Stress Lab at [phone].  If you cannot keep your appointment, please provide 24 hours notification to the Stress Lab, to avoid a possible $50 charge to your account

## 2019-12-10 DIAGNOSIS — C61 Malignant neoplasm of prostate: Secondary | ICD-10-CM | POA: Diagnosis not present

## 2019-12-11 DIAGNOSIS — G7000 Myasthenia gravis without (acute) exacerbation: Secondary | ICD-10-CM | POA: Diagnosis not present

## 2019-12-11 DIAGNOSIS — M1990 Unspecified osteoarthritis, unspecified site: Secondary | ICD-10-CM | POA: Diagnosis not present

## 2019-12-11 DIAGNOSIS — K219 Gastro-esophageal reflux disease without esophagitis: Secondary | ICD-10-CM | POA: Diagnosis not present

## 2019-12-11 DIAGNOSIS — R7301 Impaired fasting glucose: Secondary | ICD-10-CM | POA: Diagnosis not present

## 2019-12-11 DIAGNOSIS — I10 Essential (primary) hypertension: Secondary | ICD-10-CM | POA: Diagnosis not present

## 2019-12-11 DIAGNOSIS — C61 Malignant neoplasm of prostate: Secondary | ICD-10-CM | POA: Diagnosis not present

## 2019-12-11 DIAGNOSIS — I480 Paroxysmal atrial fibrillation: Secondary | ICD-10-CM | POA: Diagnosis not present

## 2019-12-11 DIAGNOSIS — E785 Hyperlipidemia, unspecified: Secondary | ICD-10-CM | POA: Diagnosis not present

## 2019-12-11 DIAGNOSIS — D126 Benign neoplasm of colon, unspecified: Secondary | ICD-10-CM | POA: Diagnosis not present

## 2019-12-11 DIAGNOSIS — Z9359 Other cystostomy status: Secondary | ICD-10-CM | POA: Diagnosis not present

## 2019-12-11 DIAGNOSIS — E039 Hypothyroidism, unspecified: Secondary | ICD-10-CM | POA: Diagnosis not present

## 2019-12-12 ENCOUNTER — Telehealth (HOSPITAL_COMMUNITY): Payer: Self-pay | Admitting: *Deleted

## 2019-12-12 NOTE — Telephone Encounter (Signed)
Close encounter 

## 2019-12-13 ENCOUNTER — Other Ambulatory Visit (HOSPITAL_COMMUNITY)
Admission: RE | Admit: 2019-12-13 | Discharge: 2019-12-13 | Disposition: A | Discharge status: Home / Self Care | Payer: Medicare Other | Source: Ambulatory Visit | Attending: Cardiovascular Disease | Admitting: Cardiovascular Disease

## 2019-12-13 DIAGNOSIS — Z20822 Contact with and (suspected) exposure to covid-19: Secondary | ICD-10-CM | POA: Insufficient documentation

## 2019-12-13 DIAGNOSIS — Z01812 Encounter for preprocedural laboratory examination: Secondary | ICD-10-CM | POA: Diagnosis not present

## 2019-12-13 LAB — SARS CORONAVIRUS 2 (TAT 6-24 HRS): SARS Coronavirus 2: NEGATIVE

## 2019-12-16 ENCOUNTER — Other Ambulatory Visit: Payer: Self-pay | Admitting: Cardiology

## 2019-12-16 DIAGNOSIS — I480 Paroxysmal atrial fibrillation: Secondary | ICD-10-CM

## 2019-12-16 NOTE — Telephone Encounter (Signed)
Rx has been sent to the pharmacy electronically. ° °

## 2019-12-17 ENCOUNTER — Ambulatory Visit (HOSPITAL_COMMUNITY)
Admission: RE | Admit: 2019-12-17 | Discharge: 2019-12-17 | Disposition: A | Discharge status: Home / Self Care | Payer: Medicare Other | Source: Ambulatory Visit | Attending: Cardiovascular Disease | Admitting: Cardiovascular Disease

## 2019-12-17 ENCOUNTER — Other Ambulatory Visit: Payer: Self-pay

## 2019-12-17 DIAGNOSIS — R5383 Other fatigue: Secondary | ICD-10-CM | POA: Diagnosis not present

## 2019-12-17 LAB — EXERCISE TOLERANCE TEST
Estimated workload: 8.1 METS
Exercise duration (min): 6 min
Exercise duration (sec): 43 s
MPHR: 145 {beats}/min
Peak HR: 131 {beats}/min
Percent HR: 90 %
Rest HR: 64 {beats}/min

## 2020-01-02 DIAGNOSIS — Z466 Encounter for fitting and adjustment of urinary device: Secondary | ICD-10-CM | POA: Diagnosis not present

## 2020-01-17 ENCOUNTER — Other Ambulatory Visit: Payer: Self-pay | Admitting: Adult Health

## 2020-01-20 ENCOUNTER — Other Ambulatory Visit: Payer: Self-pay

## 2020-01-20 ENCOUNTER — Encounter: Payer: Self-pay | Admitting: *Deleted

## 2020-01-20 ENCOUNTER — Encounter: Payer: Self-pay | Admitting: Internal Medicine

## 2020-01-20 ENCOUNTER — Ambulatory Visit (INDEPENDENT_AMBULATORY_CARE_PROVIDER_SITE_OTHER): Payer: Medicare Other | Admitting: Internal Medicine

## 2020-01-20 VITALS — BP 132/72 | HR 54 | Ht 70.5 in | Wt 191.8 lb

## 2020-01-20 DIAGNOSIS — L218 Other seborrheic dermatitis: Secondary | ICD-10-CM | POA: Diagnosis not present

## 2020-01-20 DIAGNOSIS — I10 Essential (primary) hypertension: Secondary | ICD-10-CM | POA: Diagnosis not present

## 2020-01-20 DIAGNOSIS — L819 Disorder of pigmentation, unspecified: Secondary | ICD-10-CM | POA: Diagnosis not present

## 2020-01-20 DIAGNOSIS — L814 Other melanin hyperpigmentation: Secondary | ICD-10-CM | POA: Diagnosis not present

## 2020-01-20 DIAGNOSIS — I2510 Atherosclerotic heart disease of native coronary artery without angina pectoris: Secondary | ICD-10-CM

## 2020-01-20 DIAGNOSIS — I4891 Unspecified atrial fibrillation: Secondary | ICD-10-CM

## 2020-01-20 DIAGNOSIS — D229 Melanocytic nevi, unspecified: Secondary | ICD-10-CM | POA: Diagnosis not present

## 2020-01-20 DIAGNOSIS — L821 Other seborrheic keratosis: Secondary | ICD-10-CM | POA: Diagnosis not present

## 2020-01-20 DIAGNOSIS — L570 Actinic keratosis: Secondary | ICD-10-CM | POA: Diagnosis not present

## 2020-01-20 DIAGNOSIS — I480 Paroxysmal atrial fibrillation: Secondary | ICD-10-CM

## 2020-01-20 DIAGNOSIS — L298 Other pruritus: Secondary | ICD-10-CM | POA: Diagnosis not present

## 2020-01-20 DIAGNOSIS — D6869 Other thrombophilia: Secondary | ICD-10-CM

## 2020-01-20 NOTE — Progress Notes (Signed)
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 male who presents today for routine electrophysiology followup.  Since last being seen in our clinic, the patient reports doing very well.  His afib has increased despite flecainide.  + palpitations and fatigue. Today, he denies symptoms of  chest pain, shortness of breath,  lower extremity edema, dizziness, presyncope, or syncope.  The patient is otherwise without complaint today.   Past Medical History:  Diagnosis Date  . CAD (coronary artery disease), native coronary artery 11/13/2017   Coronary calcification noted on CT scan   . Chronic kidney disease (CKD), stage III (moderate) ([place]) 11/13/2017  . Diplopia   . Foley catheter present    pt has supra-pubic foley catheter in place/since 2016  . GERD (gastroesophageal reflux disease)   . Hyperlipemia   . Hypertension   . Hypertensive heart disease without CHF 11/13/2017  . Hypothyroidism   . Impaired fasting glucose   . MG (myasthenia gravis) ([place])   . Paroxysmal atrial fibrillation ([place]) 11/13/2017   RF ablation Dr. [name] 2010 [facility] CHADSVASC 3  . Prostate cancer ([place]) 2009  . Urethral obstruction   . Urinary retention    Suprapubic Catheter placed 04/25/15   Past Surgical History:  Procedure Laterality Date  . 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 ATRIAL FIBRILLATION ABLATION N/A 07/23/2019   Procedure: ATRIAL FIBRILLATION ABLATION;  Surgeon: [name], MD;  Location: [place] [facility];  Service: Cardiovascular;  Laterality: N/A;  . catheter ablationfor afib  2010  . COLONOSCOPY    . PROSTATECTOMY  2009  . TONSILLECTOMY      ROS- all systems are reviewed and negatives except as per HPI above  Current Outpatient Medications  Medication Sig Dispense Refill  . azaTHIOprine (IMURAN) 50 MG tablet Take 2 tablets by mouth twice  daily 360 tablet 3  . cholecalciferol (VITAMIN D3) 25 MCG (1000 UNIT) tablet Take 1,000 Units by mouth daily. 1 Tablet Daily    . clotrimazole-betamethasone (LOTRISONE) cream Apply 1 application topically See admin instructions.    Marland Kitchen ELIQUIS 5 MG TABS tablet Take 1 tablet by mouth twice daily 180 tablet 2  . Fish Oil OIL Take 1,000 mg by mouth daily.     . flecainide (TAMBOCOR) 50 MG tablet Take 1 tablet (50 mg total) by mouth 2 (two) times daily. 180 tablet 3  . gluconic acid-citric acid (RENACIDIN) irrigation Irrigate with 30 mLs as directed every other day.    . Incontinence Supplies (CATHETER EXTENSION TUBING) MISC Inject 1 fluid ounce into the vein as needed. Use as directed    . Leuprolide Acetate (LUPRON IJ) Inject as directed as needed (Prostate cancer).     Marland Kitchen levothyroxine (SYNTHROID, LEVOTHROID) 150 MCG tablet Take 150 mcg by mouth daily.     Marland Kitchen lisinopril (PRINIVIL,ZESTRIL) 20 MG tablet Take 20 mg by mouth 2 (two) times a day.     . Multiple Vitamin (MULTIVITAMIN) tablet Take 1 tablet by mouth daily.    Marland Kitchen omeprazole (PRILOSEC) 20 MG capsule Take 20 mg by mouth daily.     Marland Kitchen oxybutynin (DITROPAN-XL) 10 MG 24 hr tablet Take 10 mg by mouth daily as needed (Bladder spasm).     . rosuvastatin (CRESTOR) 5 MG tablet Take 2.5 mg by mouth daily.    . Water For Irrigation, Sterile (STERILE WATER  FOR IRRIGATION) Irrigate with 1,000,000 mLs as directed as needed. Use as directed     No current facility-administered medications for this visit.    Physical Exam: Vitals:   01/20/20 1106  BP: 132/72  Pulse: (!) 54  SpO2: 97%  Weight: 191 lb 12.8 oz (87 kg)  Height: 5' 10.5" (1.791 m)    GEN- The patient is well appearing, alert and oriented x 3 today.   Head- normocephalic, atraumatic Eyes-  Sclera clear, conjunctiva pink Ears- hearing intact Oropharynx- clear Lungs- Clear to ausculation bilaterally, normal work of breathing Heart- Regular rate and rhythm, no murmurs, rubs or gallops,  PMI not laterally displaced GI- soft, NT, ND, + BS Extremities- no clubbing, cyanosis, or edema  Wt Readings from Last 3 Encounters:  01/20/20 191 lb 12.8 oz (87 kg)  12/04/19 190 lb 12.8 oz (86.5 kg)  10/23/19 190 lb 9.6 oz (86.5 kg)    EKG tracing ordered today is personally reviewed and shows sinus rhythm with first degree AV block, PACs, incomplete RBBB  ETT reviewed Dr Mariea Clonts notes are reviewed  Assessment and Plan:  1. Paroxysmal atrial fibrillation Doing well post ablation with flecainide chads2vasc score is 3.  He is on eliquis  Therapeutic strategies for afib including medicine (tikosyn/ amiodarone) and repeat ablation were discussed in detail with the patient today. Risk, benefits, and alternatives to EP study and radiofrequency ablation for afib were also discussed in detail today. These risks include but are not limited to stroke, bleeding, vascular damage, tamponade, perforation, damage to the esophagus, lungs, and other structures, pulmonary vein stenosis, worsening renal function, and death. The patient understands these risk and wishes to proceed.  We will therefore proceed with catheter ablation at the next available time.  Carto, ICE, anesthesia are requested for the procedure.  Will also obtain cardiac CT prior to the procedure to exclude LAA thrombus and further evaluate atrial anatomy.  This would be his third ablation. We are limited by AV conduction with any possible changes to flecainide.  2. HTN Stable No change required today  3. Presyncope resolved  Risks, benefits and potential toxicities for medications prescribed and/or refilled reviewed with patient today.   Return in 4 months  [name] MD, [facility] 01/20/2020 11:51 AM

## 2020-01-20 NOTE — H&P (View-Only) (Signed)
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 male who presents today for routine electrophysiology followup.  Since last being seen in our clinic, the patient reports doing very well.  His afib has increased despite flecainide.  + palpitations and fatigue. Today, he denies symptoms of  chest pain, shortness of breath,  lower extremity edema, dizziness, presyncope, or syncope.  The patient is otherwise without complaint today.   Past Medical History:  Diagnosis Date  . CAD (coronary artery disease), native coronary artery 11/13/2017   Coronary calcification noted on CT scan   . Chronic kidney disease (CKD), stage III (moderate) ([place]) 11/13/2017  . Diplopia   . Foley catheter present    pt has supra-pubic foley catheter in place/since 2016  . GERD (gastroesophageal reflux disease)   . Hyperlipemia   . Hypertension   . Hypertensive heart disease without CHF 11/13/2017  . Hypothyroidism   . Impaired fasting glucose   . MG (myasthenia gravis) ([place])   . Paroxysmal atrial fibrillation ([place]) 11/13/2017   RF ablation Dr. [name] 2010 [facility] CHADSVASC 3  . Prostate cancer ([place]) 2009  . Urethral obstruction   . Urinary retention    Suprapubic Catheter placed 04/25/15   Past Surgical History:  Procedure Laterality Date  . 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 ATRIAL FIBRILLATION ABLATION N/A 07/23/2019   Procedure: ATRIAL FIBRILLATION ABLATION;  Surgeon: [name], MD;  Location: [facility];  Service: Cardiovascular;  Laterality: N/A;  . catheter ablationfor afib  2010  . COLONOSCOPY    . PROSTATECTOMY  2009  . TONSILLECTOMY      ROS- all systems are reviewed and negatives except as per HPI above  Current Outpatient Medications  Medication Sig Dispense Refill  . azaTHIOprine (IMURAN) 50 MG tablet Take 2 tablets by mouth twice  daily 360 tablet 3  . cholecalciferol (VITAMIN D3) 25 MCG (1000 UNIT) tablet Take 1,000 Units by mouth daily. 1 Tablet Daily    . clotrimazole-betamethasone (LOTRISONE) cream Apply 1 application topically See admin instructions.    Marland Kitchen ELIQUIS 5 MG TABS tablet Take 1 tablet by mouth twice daily 180 tablet 2  . Fish Oil OIL Take 1,000 mg by mouth daily.     . flecainide (TAMBOCOR) 50 MG tablet Take 1 tablet (50 mg total) by mouth 2 (two) times daily. 180 tablet 3  . gluconic acid-citric acid (RENACIDIN) irrigation Irrigate with 30 mLs as directed every other day.    . Incontinence Supplies (CATHETER EXTENSION TUBING) MISC Inject 1 fluid ounce into the vein as needed. Use as directed    . Leuprolide Acetate (LUPRON IJ) Inject as directed as needed (Prostate cancer).     Marland Kitchen levothyroxine (SYNTHROID, LEVOTHROID) 150 MCG tablet Take 150 mcg by mouth daily.     Marland Kitchen lisinopril (PRINIVIL,ZESTRIL) 20 MG tablet Take 20 mg by mouth 2 (two) times a day.     . Multiple Vitamin (MULTIVITAMIN) tablet Take 1 tablet by mouth daily.    Marland Kitchen omeprazole (PRILOSEC) 20 MG capsule Take 20 mg by mouth daily.     Marland Kitchen oxybutynin (DITROPAN-XL) 10 MG 24 hr tablet Take 10 mg by mouth daily as needed (Bladder spasm).     . rosuvastatin (CRESTOR) 5 MG tablet Take 2.5 mg by mouth daily.    . Water For Irrigation, Sterile (STERILE WATER  FOR IRRIGATION) Irrigate with 1,000,000 mLs as directed as needed. Use as directed     No current facility-administered medications for this visit.    Physical Exam: Vitals:   01/20/20 1106  BP: 132/72  Pulse: (!) 54  SpO2: 97%  Weight: 191 lb 12.8 oz (87 kg)  Height: 5' 10.5" (1.791 m)    GEN- The patient is well appearing, alert and oriented x 3 today.   Head- normocephalic, atraumatic Eyes-  Sclera clear, conjunctiva pink Ears- hearing intact Oropharynx- clear Lungs- Clear to ausculation bilaterally, normal work of breathing Heart- Regular rate and rhythm, no murmurs, rubs or gallops,  PMI not laterally displaced GI- soft, NT, ND, + BS Extremities- no clubbing, cyanosis, or edema  Wt Readings from Last 3 Encounters:  01/20/20 191 lb 12.8 oz (87 kg)  12/04/19 190 lb 12.8 oz (86.5 kg)  10/23/19 190 lb 9.6 oz (86.5 kg)    EKG tracing ordered today is personally reviewed and shows sinus rhythm with first degree AV block, PACs, incomplete RBBB  ETT reviewed Dr Mariea Clonts notes are reviewed  Assessment and Plan:  1. Paroxysmal atrial fibrillation Doing well post ablation with flecainide chads2vasc score is 3.  He is on eliquis  Therapeutic strategies for afib including medicine (tikosyn/ amiodarone) and repeat ablation were discussed in detail with the patient today. Risk, benefits, and alternatives to EP study and radiofrequency ablation for afib were also discussed in detail today. These risks include but are not limited to stroke, bleeding, vascular damage, tamponade, perforation, damage to the esophagus, lungs, and other structures, pulmonary vein stenosis, worsening renal function, and death. The patient understands these risk and wishes to proceed.  We will therefore proceed with catheter ablation at the next available time.  Carto, ICE, anesthesia are requested for the procedure.  Will also obtain cardiac CT prior to the procedure to exclude LAA thrombus and further evaluate atrial anatomy.  This would be his third ablation. We are limited by AV conduction with any possible changes to flecainide.  2. HTN Stable No change required today  3. Presyncope resolved  Risks, benefits and potential toxicities for medications prescribed and/or refilled reviewed with patient today.   Return in 4 months  [name] MD, [facility] 01/20/2020 11:51 AM

## 2020-01-20 NOTE — Patient Instructions (Addendum)
Medication Instructions:  Your physician recommends that you continue on your current medications as directed. Please refer to the Current Medication list given to you today.  *If you need a refill on your cardiac medications before your next appointment, please call your pharmacy*  Lab Work: CBC, BMP  If you have labs (blood work) drawn today and your tests are completely normal, you will receive your results only by:  [place] (if you have MyChart) OR  A paper copy in the mail If you have any lab test that is abnormal or we need to change your treatment, we will call you to review the results.  Testing/Procedures: Your physician has recommended that you have an ablation. Catheter ablation is a medical procedure used to treat some cardiac arrhythmias (irregular heartbeats). During catheter ablation, a long, thin, flexible tube is put into a blood vessel in your groin (upper thigh), or neck. This tube is called an ablation catheter. It is then guided to your heart through the blood vessel. Radio frequency waves destroy small areas of heart tissue where abnormal heartbeats may cause an arrhythmia to start. Please see the instruction sheet given to you today.   Follow-Up: At [facility], you and your health needs are our priority.  As part of our continuing mission to provide you with exceptional heart care, we have created designated Provider Care Teams.  These Care Teams include your primary Cardiologist (physician) and Advanced Practice Providers (APPs -  Physician Assistants and Nurse Practitioners) who all work together to provide you with the care you need, when you need it.  We recommend signing up for the patient portal called "MyChart".  Sign up information is provided on this After Visit Summary.  MyChart is used to connect with patients for Virtual Visits (Telemedicine).  Patients are able to view lab/test results, encounter notes, upcoming appointments, etc.  Non-urgent  messages can be sent to your provider as well.   To learn more about what you can do with MyChart, go to [email or web address].     Other Instructions:  Cardiac Ablation  Cardiac ablation is a procedure to stop some heart tissue from causing problems. The heart has many electrical connections. Sometimes these connections make the heart beat very fast or irregularly. Removing some problem areas can improve the heart rhythm or make it normal. What happens before the procedure?  Follow instructions from your doctor about what you cannot eat or drink.  Ask your doctor about: ? Changing or stopping your normal medicines. This is important if you take diabetes medicines or blood thinners. ? Taking medicines such as aspirin and ibuprofen. These medicines can thin your blood. Do not take these medicines before your procedure if your doctor tells you not to.  Plan to have someone take you home.  If you will be going home right after the procedure, plan to have someone with you for 24 hours. What happens during the procedure?  To lower your risk of infection: ? Your health care team will wash or sanitize their hands. ? Your skin will be washed with soap. ? Hair may be removed from your neck or groin.  An IV tube will be put into one of your veins.  You will be given a medicine to help you relax (sedative).  Skin on your neck or groin will be numbed.  A cut (incision) will be made in your neck or groin.  A needle will be put through your cut and into a vein in your  neck or groin.  A tube (catheter) will be put into the needle. The tube will be moved to your heart. X-rays (fluoroscopy) will be used to help guide the tube.  Small devices (electrodes) on the tip of the tube will send out electrical currents.  Dye may be put through the tube. This helps your surgeon see your heart.  Electrical energy will be used to scar (ablate) some heart tissue. Your surgeon may use: ? Heat  (radiofrequency energy). ? Laser energy. ? Extreme cold (cryoablation).  The tube will be taken out.  Pressure will be held on your cut. This helps stop bleeding.  A bandage (dressing) will be put on your cut. The procedure may vary. What happens after the procedure?  You will be monitored until your medicines have worn off.  Your cut will be watched for bleeding. You will need to lie still for a few hours.  Do not drive for 24 hours or as long as your doctor tells you. Summary  Cardiac ablation is a procedure to stop some heart tissue from causing problems.  Electrical energy will be used to scar (ablate) some heart tissue. This information is not intended to replace advice given to you by your health care provider. Make sure you discuss any questions you have with your health care provider. Document Revised: 12/30/2016 Document Reviewed: 12/07/2015 Elsevier Patient Education  2020 [employer].

## 2020-01-21 LAB — CBC WITH DIFFERENTIAL/PLATELET
Basophils Absolute: 0 10*3/uL (ref 0.0–0.2)
Basos: 1 %
EOS (ABSOLUTE): 0 10*3/uL (ref 0.0–0.4)
Eos: 1 %
Hematocrit: 35.6 % — ABNORMAL LOW (ref 37.5–51.0)
Hemoglobin: 12 g/dL — ABNORMAL LOW (ref 13.0–17.7)
Immature Grans (Abs): 0 10*3/uL (ref 0.0–0.1)
Immature Granulocytes: 0 %
Lymphocytes Absolute: 1.1 10*3/uL (ref 0.7–3.1)
Lymphs: 22 %
MCH: 32.3 pg (ref 26.6–33.0)
MCHC: 33.7 g/dL (ref 31.5–35.7)
MCV: 96 fL (ref 79–97)
Monocytes Absolute: 0.6 10*3/uL (ref 0.1–0.9)
Monocytes: 12 %
Neutrophils Absolute: 3.2 10*3/uL (ref 1.4–7.0)
Neutrophils: 64 %
Platelets: 387 10*3/uL (ref 150–450)
RBC: 3.71 x10E6/uL — ABNORMAL LOW (ref 4.14–5.80)
RDW: 14.1 % (ref 11.6–15.4)
WBC: 5 10*3/uL (ref 3.4–10.8)

## 2020-01-21 LAB — BASIC METABOLIC PANEL
BUN/Creatinine Ratio: 16 (ref 10–24)
BUN: 17 mg/dL (ref 8–27)
CO2: 24 mmol/L (ref 20–29)
Calcium: 9.9 mg/dL (ref 8.6–10.2)
Chloride: 100 mmol/L (ref 96–106)
Creatinine, Ser: 1.07 mg/dL (ref 0.76–1.27)
GFR calc Af Amer: 78 mL/min/{1.73_m2} (ref >59)
GFR calc non Af Amer: 68 mL/min/{1.73_m2} (ref >59)
Glucose: 104 mg/dL — ABNORMAL HIGH (ref 65–99)
Potassium: 4.9 mmol/L (ref 3.5–5.2)
Sodium: 139 mmol/L (ref 134–144)

## 2020-01-27 ENCOUNTER — Telehealth (HOSPITAL_COMMUNITY): Payer: Self-pay | Admitting: Emergency Medicine

## 2020-01-27 NOTE — Telephone Encounter (Signed)
Reaching out to patient to offer assistance regarding upcoming cardiac imaging study; pt verbalizes understanding of appt date/time, parking situation and where to check in, pre-test NPO status and medications ordered, and verified current allergies; name and call back number provided for further questions should they arise Odilia Damico RN Navigator Cardiac Imaging [facility] [phone] office [phone] cell 

## 2020-01-29 ENCOUNTER — Other Ambulatory Visit: Payer: Self-pay

## 2020-01-29 ENCOUNTER — Ambulatory Visit (HOSPITAL_COMMUNITY)
Admission: RE | Admit: 2020-01-29 | Discharge: 2020-01-29 | Disposition: A | Discharge status: Home / Self Care | Payer: Medicare Other | Source: Ambulatory Visit | Attending: Internal Medicine | Admitting: Internal Medicine

## 2020-01-29 DIAGNOSIS — I4891 Unspecified atrial fibrillation: Secondary | ICD-10-CM

## 2020-01-29 MED ORDER — IOHEXOL 350 MG/ML SOLN
80.0000 mL | Freq: Once | INTRAVENOUS | Status: AC | PRN
  Administered 2020-01-29: 80 mL via INTRAVENOUS

## 2020-02-01 HISTORY — PX: COLONOSCOPY: SHX174

## 2020-02-03 DIAGNOSIS — Z9359 Other cystostomy status: Secondary | ICD-10-CM | POA: Diagnosis not present

## 2020-02-03 DIAGNOSIS — R338 Other retention of urine: Secondary | ICD-10-CM | POA: Diagnosis not present

## 2020-02-04 ENCOUNTER — Other Ambulatory Visit (HOSPITAL_COMMUNITY)
Admission: RE | Admit: 2020-02-04 | Discharge: 2020-02-04 | Disposition: A | Discharge status: Home / Self Care | Payer: Medicare Other | Source: Ambulatory Visit | Attending: Internal Medicine | Admitting: Internal Medicine

## 2020-02-04 DIAGNOSIS — Z01812 Encounter for preprocedural laboratory examination: Secondary | ICD-10-CM | POA: Diagnosis not present

## 2020-02-04 DIAGNOSIS — Z20822 Contact with and (suspected) exposure to covid-19: Secondary | ICD-10-CM | POA: Diagnosis not present

## 2020-02-04 LAB — SARS CORONAVIRUS 2 (TAT 6-24 HRS): SARS Coronavirus 2: NEGATIVE

## 2020-02-05 NOTE — Progress Notes (Signed)
Instructed patient on the following items: Arrival time 0830 Nothing to eat or drink after midnight No meds AM of procedure Responsible person to drive you home and stay with you for 24 hrs  Have you missed any doses of anti-coagulant Eliquis- hasn't missed any doses   

## 2020-02-06 ENCOUNTER — Encounter (HOSPITAL_COMMUNITY)
Admission: RE | Disposition: A | Discharge status: Home / Self Care | Payer: Self-pay | Source: Home / Self Care | Attending: Internal Medicine

## 2020-02-06 ENCOUNTER — Ambulatory Visit (HOSPITAL_COMMUNITY): Payer: Medicare Other | Admitting: Certified Registered Nurse Anesthetist

## 2020-02-06 ENCOUNTER — Other Ambulatory Visit: Payer: Self-pay

## 2020-02-06 ENCOUNTER — Ambulatory Visit (HOSPITAL_COMMUNITY)
Admission: RE | Admit: 2020-02-06 | Discharge: 2020-02-06 | Disposition: A | Discharge status: Home / Self Care | Payer: Medicare Other | Attending: Internal Medicine | Admitting: Internal Medicine

## 2020-02-06 ENCOUNTER — Encounter (HOSPITAL_COMMUNITY): Payer: Self-pay | Admitting: Internal Medicine

## 2020-02-06 DIAGNOSIS — I480 Paroxysmal atrial fibrillation: Secondary | ICD-10-CM | POA: Insufficient documentation

## 2020-02-06 DIAGNOSIS — Z79899 Other long term (current) drug therapy: Secondary | ICD-10-CM | POA: Diagnosis not present

## 2020-02-06 DIAGNOSIS — Z7901 Long term (current) use of anticoagulants: Secondary | ICD-10-CM | POA: Insufficient documentation

## 2020-02-06 DIAGNOSIS — I10 Essential (primary) hypertension: Secondary | ICD-10-CM | POA: Insufficient documentation

## 2020-02-06 DIAGNOSIS — I129 Hypertensive chronic kidney disease with stage 1 through stage 4 chronic kidney disease, or unspecified chronic kidney disease: Secondary | ICD-10-CM | POA: Diagnosis not present

## 2020-02-06 DIAGNOSIS — I2510 Atherosclerotic heart disease of native coronary artery without angina pectoris: Secondary | ICD-10-CM | POA: Diagnosis not present

## 2020-02-06 DIAGNOSIS — N183 Chronic kidney disease, stage 3 unspecified: Secondary | ICD-10-CM | POA: Diagnosis not present

## 2020-02-06 HISTORY — PX: ATRIAL FIBRILLATION ABLATION: EP1191

## 2020-02-06 LAB — POCT ACTIVATED CLOTTING TIME: Activated Clotting Time: 297 seconds

## 2020-02-06 SURGERY — ATRIAL FIBRILLATION ABLATION
Anesthesia: General

## 2020-02-06 MED ORDER — ACETAMINOPHEN 325 MG PO TABS: 650.0000 mg | ORAL_TABLET | ORAL | Status: DC | PRN

## 2020-02-06 MED ORDER — SODIUM CHLORIDE 0.9 % IV SOLN: INTRAVENOUS | Status: DC

## 2020-02-06 MED ORDER — FENTANYL CITRATE (PF) 250 MCG/5ML IJ SOLN
INTRAMUSCULAR | Status: DC | PRN
  Administered 2020-02-06: 100 ug via INTRAVENOUS

## 2020-02-06 MED ORDER — LIDOCAINE 2% (20 MG/ML) 5 ML SYRINGE
INTRAMUSCULAR | Status: DC | PRN
  Administered 2020-02-06: 40 mg via INTRAVENOUS

## 2020-02-06 MED ORDER — SODIUM CHLORIDE 0.9% FLUSH: 3.0000 mL | Freq: Two times a day (BID) | INTRAVENOUS | Status: DC

## 2020-02-06 MED ORDER — HEPARIN SODIUM (PORCINE) 1000 UNIT/ML IJ SOLN
INTRAMUSCULAR | Status: DC | PRN
  Administered 2020-02-06: 3000 [IU] via INTRAVENOUS

## 2020-02-06 MED ORDER — HEPARIN (PORCINE) IN NACL 1000-0.9 UT/500ML-% IV SOLN
INTRAVENOUS | Status: AC
  Filled 2020-02-06: qty 500

## 2020-02-06 MED ORDER — HEPARIN (PORCINE) IN NACL 1000-0.9 UT/500ML-% IV SOLN
INTRAVENOUS | Status: DC | PRN
  Administered 2020-02-06: 500 mL

## 2020-02-06 MED ORDER — PROTAMINE SULFATE 10 MG/ML IV SOLN
INTRAVENOUS | Status: DC | PRN
  Administered 2020-02-06: 40 mg via INTRAVENOUS

## 2020-02-06 MED ORDER — DEXAMETHASONE SODIUM PHOSPHATE 10 MG/ML IJ SOLN
INTRAMUSCULAR | Status: DC | PRN
  Administered 2020-02-06: 5 mg via INTRAVENOUS

## 2020-02-06 MED ORDER — HEPARIN SODIUM (PORCINE) 1000 UNIT/ML IJ SOLN
INTRAMUSCULAR | Status: DC | PRN
  Administered 2020-02-06: 1000 [IU] via INTRAVENOUS

## 2020-02-06 MED ORDER — ISOPROTERENOL HCL 0.2 MG/ML IJ SOLN
INTRAMUSCULAR | Status: AC
  Filled 2020-02-06: qty 5

## 2020-02-06 MED ORDER — HYDROCODONE-ACETAMINOPHEN 5-325 MG PO TABS: 1.0000 | ORAL_TABLET | ORAL | Status: DC | PRN

## 2020-02-06 MED ORDER — SODIUM CHLORIDE 0.9% FLUSH: 3.0000 mL | INTRAVENOUS | Status: DC | PRN

## 2020-02-06 MED ORDER — PHENYLEPHRINE HCL-NACL 10-0.9 MG/250ML-% IV SOLN
INTRAVENOUS | Status: DC | PRN
  Administered 2020-02-06: 40 ug/min via INTRAVENOUS

## 2020-02-06 MED ORDER — HEPARIN SODIUM (PORCINE) 1000 UNIT/ML IJ SOLN
INTRAMUSCULAR | Status: AC
  Filled 2020-02-06: qty 2

## 2020-02-06 MED ORDER — ROCURONIUM BROMIDE 10 MG/ML (PF) SYRINGE
PREFILLED_SYRINGE | INTRAVENOUS | Status: DC | PRN
  Administered 2020-02-06: 50 mg via INTRAVENOUS

## 2020-02-06 MED ORDER — HEPARIN (PORCINE) IN NACL 2000-0.9 UNIT/L-% IV SOLN
INTRAVENOUS | Status: DC | PRN
  Administered 2020-02-06: 15000 mL

## 2020-02-06 MED ORDER — ONDANSETRON HCL 4 MG/2ML IJ SOLN
INTRAMUSCULAR | Status: DC | PRN
  Administered 2020-02-06: 4 mg via INTRAVENOUS

## 2020-02-06 MED ORDER — PANTOPRAZOLE SODIUM 40 MG PO TBEC: 40.0000 mg | DELAYED_RELEASE_TABLET | Freq: Every day | ORAL | 0 refills | Status: DC

## 2020-02-06 MED ORDER — ONDANSETRON HCL 4 MG/2ML IJ SOLN: 4.0000 mg | Freq: Four times a day (QID) | INTRAMUSCULAR | Status: DC | PRN

## 2020-02-06 MED ORDER — ISOPROTERENOL HCL 0.2 MG/ML IJ SOLN
INTRAVENOUS | Status: DC | PRN
  Administered 2020-02-06: 10 ug/min via INTRAVENOUS

## 2020-02-06 MED ORDER — SUGAMMADEX SODIUM 200 MG/2ML IV SOLN
INTRAVENOUS | Status: DC | PRN
  Administered 2020-02-06: 200 mg via INTRAVENOUS

## 2020-02-06 MED ORDER — APIXABAN 5 MG PO TABS
5.0000 mg | ORAL_TABLET | ORAL | Status: AC
  Administered 2020-02-06: 5 mg via ORAL
  Filled 2020-02-06: qty 1

## 2020-02-06 MED ORDER — PROPOFOL 10 MG/ML IV BOLUS
INTRAVENOUS | Status: DC | PRN
  Administered 2020-02-06: 140 mg via INTRAVENOUS

## 2020-02-06 MED ORDER — SODIUM CHLORIDE 0.9 % IV SOLN: 250.0000 mL | INTRAVENOUS | Status: DC | PRN

## 2020-02-06 MED ORDER — SUCCINYLCHOLINE CHLORIDE 200 MG/10ML IV SOSY
PREFILLED_SYRINGE | INTRAVENOUS | Status: DC | PRN
  Administered 2020-02-06: 120 mg via INTRAVENOUS

## 2020-02-06 SURGICAL SUPPLY — 19 items
BLANKET WARM UNDERBOD FULL ACC (MISCELLANEOUS) ×2 IMPLANT
CATH 8FR REPROCESSED SOUNDSTAR (CATHETERS) ×2 IMPLANT
CATH MAPPNG PENTARAY F 2-6-2MM (CATHETERS) ×1 IMPLANT
CATH SMTCH THERMOCOOL SF DF (CATHETERS) ×2 IMPLANT
CATH WEB BI DIR CSDF CRV REPRO (CATHETERS) ×2 IMPLANT
CLOSURE PERCLOSE PROSTYLE (VASCULAR PRODUCTS) ×6 IMPLANT
COVER SWIFTLINK CONNECTOR (BAG) ×2 IMPLANT
MAT PREVALON FULL STRYKER (MISCELLANEOUS) ×2 IMPLANT
NEEDLE BAYLIS TRANSSEPTAL 71CM (NEEDLE) ×2 IMPLANT
PACK EP LATEX FREE (CUSTOM PROCEDURE TRAY) ×2
PACK EP LF (CUSTOM PROCEDURE TRAY) ×1 IMPLANT
PAD PRO RADIOLUCENT 2001M-C (PAD) ×2 IMPLANT
PATCH CARTO3 (PAD) ×2 IMPLANT
PENTARAY F 2-6-2MM (CATHETERS) ×2
SHEATH PINNACLE 7F 10CM (SHEATH) ×4 IMPLANT
SHEATH PINNACLE 9F 10CM (SHEATH) ×2 IMPLANT
SHEATH PROBE COVER 6X72 (BAG) ×2 IMPLANT
SHEATH SWARTZ TS SL2 63CM 8.5F (SHEATH) ×2 IMPLANT
TUBING SMART ABLATE COOLFLOW (TUBING) ×2 IMPLANT

## 2020-02-06 NOTE — Anesthesia Procedure Notes (Signed)
Procedure Name: Intubation Date/Time: 02/06/2020 11:32 AM Performed by: Tressia Miners, CRNA Pre-anesthesia Checklist: Patient identified, Emergency Drugs available, Suction available and Patient being monitored Patient Re-evaluated:Patient Re-evaluated prior to induction Oxygen Delivery Method: Circle System Utilized Preoxygenation: Pre-oxygenation with 100% oxygen Induction Type: IV induction Ventilation: Mask ventilation without difficulty Laryngoscope Size: Glidescope and 4 Grade View: Grade I Tube type: Oral Number of attempts: 1 Airway Equipment and Method: Stylet Placement Confirmation: ETT inserted through vocal cords under direct vision,  positive ETCO2 and breath sounds checked- equal and bilateral Secured at: 23 cm Tube secured with: Tape Dental Injury: Teeth and Oropharynx as per pre-operative assessment

## 2020-02-06 NOTE — Progress Notes (Signed)
Patient was given discharge instructions. He verbalized understanding. 

## 2020-02-06 NOTE — Interval H&P Note (Signed)
History and Physical Interval Note:  02/06/2020 10:39 AM  Eugene Castaneda  has presented today for surgery, with the diagnosis of afib.  The various methods of treatment have been discussed with the patient and family. After consideration of risks, benefits and other options for treatment, the patient has consented to  Procedure(s): ATRIAL FIBRILLATION ABLATION (N/A) as a surgical intervention.  The patient's history has been reviewed, patient examined, no change in status, stable for surgery.  I have reviewed the patient's chart and labs.  Questions were answered to the patient's satisfaction.     Risk, benefits, and alternatives to EP study and radiofrequency ablation for afib were again  discussed in detail today. These risks include but are not limited to stroke, bleeding, vascular damage, tamponade, perforation, damage to the esophagus, lungs, and other structures, pulmonary vein stenosis, worsening renal function, and death. The patient understands these risk and wishes to proceed.    Cardiac CT reviewed with him at length.  We also discussed calcium score and hemaglobin.  I have advised follow-up with primary care for lipid management and repeat CBC as routine follow-up.  Denies anginal symptom.  No indication for stress testing at this time.   Hillis Range

## 2020-02-06 NOTE — Anesthesia Preprocedure Evaluation (Addendum)
Anesthesia Evaluation  Patient identified by MRN, date of birth, ID band Patient awake    Reviewed: Allergy & Precautions, NPO status , Patient's Chart, lab work & pertinent test results  Airway Mallampati: III  TM Distance: >3 FB Neck ROM: Full    Dental  (+) Teeth Intact, Dental Advisory Given   Pulmonary neg pulmonary ROS, former smoker,    breath sounds clear to auscultation       Cardiovascular hypertension, Pt. on medications + CAD  + dysrhythmias Atrial Fibrillation  Rhythm:Irregular Rate:Normal     Neuro/Psych  Neuromuscular disease negative psych ROS   GI/Hepatic Neg liver ROS, GERD  Medicated,  Endo/Other  Hypothyroidism   Renal/GU Renal InsufficiencyRenal disease     Musculoskeletal negative musculoskeletal ROS (+)   Abdominal Normal abdominal exam  (+)   Peds  Hematology   Anesthesia Other Findings   Reproductive/Obstetrics                            Anesthesia Physical Anesthesia Plan  ASA: III  Anesthesia Plan: General   Post-op Pain Management:    Induction: Intravenous  PONV Risk Score and Plan: 2 and Ondansetron and Treatment may vary due to age or medical condition  Airway Management Planned: Natural Airway and Simple Face Mask  Additional Equipment: None  Intra-op Plan:   Post-operative Plan: Extubation in OR  Informed Consent:   Plan Discussed with: CRNA  Anesthesia Plan Comments:         Anesthesia Quick Evaluation

## 2020-02-06 NOTE — Discharge Instructions (Signed)
Post procedure care instructions No driving for 4 days. No lifting over 5 lbs for 1 week. No vigorous or sexual activity for 1 week. You may return to work/your usual activities on 02/13/20. Keep procedure site clean & dry. If you notice increased pain, swelling, bleeding or pus, call/return!  You may shower after 24 hours, but no soaking in baths/hot tubs/pools for 1 week.   You have an appointment set up with the Atrial Fibrillation Clinic.  Multiple studies have shown that being followed by a dedicated atrial fibrillation clinic in addition to the standard care you receive from your other physicians improves health. We believe that enrollment in the atrial fibrillation clinic will allow [place] to better care for you.   The phone number to the Atrial Fibrillation Clinic is [phone]. The clinic is staffed Monday through Friday from 8:30am to 5pm.  Parking Directions: The clinic is located in the Heart and Vascular Building connected to [facility]. 1)From [address] turn on to [address] and go to the 3rd entrance  (Heart and Vascular entrance) on the right. 2)Look to the right for Heart &Vascular Parking Garage. 3)A code for the entrance is required, for February is 1212.   4)Take the elevators to the 1st floor. Registration is in the room with the glass walls at the end of the hallway.  If you have any trouble parking or locating the clinic, please don't hesitate to call [phone].     Femoral Site Care This sheet gives you information about how to care for yourself after your procedure. Your health care provider may also give you more specific instructions. If you have problems or questions, contact your health care provider. What can I expect after the procedure? After the procedure, it is common to have:  Bruising that usually fades within 1-2 weeks.  Tenderness at the site. Follow these instructions at home: Wound care  Follow instructions from your health care  provider about how to take care of your insertion site. Make sure you: ? Wash your hands with soap and water before you change your bandage (dressing). If soap and water are not available, use hand sanitizer. ? Change your dressing as told by your health care provider. ? Leave stitches (sutures), skin glue, or adhesive strips in place. These skin closures may need to stay in place for 2 weeks or longer. If adhesive strip edges start to loosen and curl up, you may trim the loose edges. Do not remove adhesive strips completely unless your health care provider tells you to do that.  Do not take baths, swim, or use a hot tub until your health care provider approves.  You may shower 24-48 hours after the procedure or as told by your health care provider. ? Gently wash the site with plain soap and water. ? Pat the area dry with a clean towel. ? Do not rub the site. This may cause bleeding.  Do not apply powder or lotion to the site. Keep the site clean and dry.  Check your femoral site every day for signs of infection. Check for: ? Redness, swelling, or pain. ? Fluid or blood. ? Warmth. ? Pus or a bad smell. Activity  For the first 2-3 days after your procedure, or as long as directed: ? Avoid climbing stairs as much as possible. ? Do not squat.  Do not lift anything that is heavier than 10 lb (4.5 kg), or the limit that you are told, until your health care provider  says that it is safe.  Rest as directed. ? Avoid sitting for a long time without moving. Get up to take short walks every 1-2 hours.  Do not drive for 24 hours if you were given a medicine to help you relax (sedative). General instructions  Take over-the-counter and prescription medicines only as told by your health care provider.  Keep all follow-up visits as told by your health care provider. This is important. Contact a health care provider if you have:  A fever or chills.  You have redness, swelling, or pain around  your insertion site. Get help right away if:  The catheter insertion area swells very fast.  You pass out.  You suddenly start to sweat or your skin gets clammy.  The catheter insertion area is bleeding, and the bleeding does not stop when you hold steady pressure on the area.  The area near or just beyond the catheter insertion site becomes pale, cool, tingly, or numb. These symptoms may represent a serious problem that is an emergency. Do not wait to see if the symptoms will go away. Get medical help right away. Call your local emergency services (911 in the [place] Do not drive yourself to the hospital. Summary  After the procedure, it is common to have bruising that usually fades within 1-2 weeks.  Check your femoral site every day for signs of infection.  Do not lift anything that is heavier than 10 lb (4.5 kg), or the limit that you are told, until your health care provider says that it is safe. This information is not intended to replace advice given to you by your health care provider. Make sure you discuss any questions you have with your health care provider. Document Revised: 01/30/2017 Document Reviewed: 01/30/2017 Elsevier Patient Education  2020 [employer].

## 2020-02-06 NOTE — Transfer of Care (Signed)
Immediate Anesthesia Transfer of Care Note  Patient: KELDAN EPLIN  Procedure(s) Performed: ATRIAL FIBRILLATION ABLATION (N/A )  Patient Location: PACU and Cath Lab  Anesthesia Type:General  Level of Consciousness: awake and alert   Airway & Oxygen Therapy: Patient Spontanous Breathing and Patient connected to face mask oxygen  Post-op Assessment: Report given to RN and Post -op Vital signs reviewed and stable  Post vital signs: Reviewed and stable  Last Vitals:  Vitals Value Taken Time  BP 149/56 02/06/20 1334  Temp    Pulse 68 02/06/20 1334  Resp 17 02/06/20 1334  SpO2 100 % 02/06/20 1334  Vitals shown include unvalidated device data.  Last Pain:  Vitals:   02/06/20 0905  TempSrc:   PainSc: 0-No pain         Complications: No complications documented.

## 2020-02-06 NOTE — Anesthesia Postprocedure Evaluation (Signed)
Anesthesia Post Note  Patient: XYLON CROOM  Procedure(s) Performed: ATRIAL FIBRILLATION ABLATION (N/A )     Patient location during evaluation: PACU Anesthesia Type: General Level of consciousness: awake and alert Pain management: pain level controlled Vital Signs Assessment: post-procedure vital signs reviewed and stable Respiratory status: spontaneous breathing, nonlabored ventilation, respiratory function stable and patient connected to nasal cannula oxygen Cardiovascular status: blood pressure returned to baseline and stable Postop Assessment: no apparent nausea or vomiting Anesthetic complications: no   No complications documented.  Last Vitals:  Vitals:   02/06/20 1410 02/06/20 1420  BP: (!) 147/64 (!) 153/59  Pulse: 66 65  Resp: 17 14  Temp:    SpO2: 100% 99%    Last Pain:  Vitals:   02/06/20 1330  TempSrc: Temporal  PainSc: 0-No pain                 Shelton Silvas

## 2020-02-07 ENCOUNTER — Encounter (HOSPITAL_COMMUNITY): Payer: Self-pay | Admitting: Internal Medicine

## 2020-02-27 DIAGNOSIS — Z466 Encounter for fitting and adjustment of urinary device: Secondary | ICD-10-CM | POA: Diagnosis not present

## 2020-03-04 DIAGNOSIS — N3289 Other specified disorders of bladder: Secondary | ICD-10-CM | POA: Diagnosis not present

## 2020-03-04 DIAGNOSIS — N5082 Scrotal pain: Secondary | ICD-10-CM | POA: Diagnosis not present

## 2020-03-04 DIAGNOSIS — N99116 Postprocedural urethral stricture, male, overlapping sites: Secondary | ICD-10-CM | POA: Diagnosis not present

## 2020-03-04 DIAGNOSIS — R8290 Unspecified abnormal findings in urine: Secondary | ICD-10-CM | POA: Diagnosis not present

## 2020-03-11 ENCOUNTER — Other Ambulatory Visit: Payer: Self-pay

## 2020-03-11 ENCOUNTER — Ambulatory Visit (HOSPITAL_COMMUNITY)
Admission: RE | Admit: 2020-03-11 | Discharge: 2020-03-11 | Disposition: A | Discharge status: Home / Self Care | Payer: Medicare Other | Source: Ambulatory Visit | Attending: Nurse Practitioner | Admitting: Nurse Practitioner

## 2020-03-11 VITALS — BP 128/60 | HR 55 | Ht 70.0 in | Wt 193.2 lb

## 2020-03-11 DIAGNOSIS — Z7901 Long term (current) use of anticoagulants: Secondary | ICD-10-CM | POA: Insufficient documentation

## 2020-03-11 DIAGNOSIS — Z79899 Other long term (current) drug therapy: Secondary | ICD-10-CM | POA: Diagnosis not present

## 2020-03-11 DIAGNOSIS — N183 Chronic kidney disease, stage 3 unspecified: Secondary | ICD-10-CM | POA: Insufficient documentation

## 2020-03-11 DIAGNOSIS — I129 Hypertensive chronic kidney disease with stage 1 through stage 4 chronic kidney disease, or unspecified chronic kidney disease: Secondary | ICD-10-CM | POA: Diagnosis not present

## 2020-03-11 DIAGNOSIS — D6869 Other thrombophilia: Secondary | ICD-10-CM | POA: Diagnosis not present

## 2020-03-11 DIAGNOSIS — I480 Paroxysmal atrial fibrillation: Secondary | ICD-10-CM | POA: Insufficient documentation

## 2020-03-11 DIAGNOSIS — Z87891 Personal history of nicotine dependence: Secondary | ICD-10-CM | POA: Diagnosis not present

## 2020-03-11 NOTE — Progress Notes (Signed)
Primary Care Physician: [name], MD Referring Physician: Dr. [name] is a 75 y.o. male with a h/o paroxysmal afib , tachycardia that had a 3rd  repeat ablation with Dr. Rayann Heman one  month ago.  Initial afib ablation several years ago with Dr. [name] in [place].   Marland Kitchen No swallowing or groin issues. He continues  on flecainide. He is not currently on any daily AV nodal agents.  He has been staying in [place]. He had been taking omeprazole and Protonix, started feeling worse a few weeks out of ablation and stopped Protonix with resolution of symptoms. He just started having some indigestion with burping a few days ago. Started back on both meds again. Suggested to stop omeprazole until he finished Protonix and then resume.   T oday, he denies symptoms of palpitations, chest pain, shortness of breath, orthopnea, PND, lower extremity edema, dizziness, presyncope, syncope, or neurologic sequela. The patient is tolerating medications without difficulties and is otherwise without complaint today.   Past Medical History:  Diagnosis Date  . CAD (coronary artery disease), native coronary artery 11/13/2017   Coronary calcification noted on CT scan   . Chronic kidney disease (CKD), stage III (moderate) ([place]) 11/13/2017  . Diplopia   . Foley catheter present    pt has supra-pubic foley catheter in place/since 2016  . GERD (gastroesophageal reflux disease)   . Hyperlipemia   . Hypertension   . Hypertensive heart disease without CHF 11/13/2017  . Hypothyroidism   . Impaired fasting glucose   . MG (myasthenia gravis) ([place])   . Paroxysmal atrial fibrillation ([place]) 11/13/2017   RF ablation Dr. [name] 2010 [facility] CHADSVASC 3  . Prostate cancer ([place]) 2009  . Urethral obstruction   . Urinary retention    Suprapubic Catheter placed 04/25/15   Past Surgical History:  Procedure Laterality Date  . 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 ATRIAL FIBRILLATION ABLATION N/A 07/23/2019   Procedure: ATRIAL FIBRILLATION ABLATION;  Surgeon: [name], MD;  Location: [place];  Service: Cardiovascular;  Laterality: N/A;  . ATRIAL FIBRILLATION ABLATION N/A 02/06/2020   Procedure: ATRIAL FIBRILLATION ABLATION;  Surgeon: [name], MD;  Location: [facility] [facility];  Service: Cardiovascular;  Laterality: N/A;  . catheter ablationfor afib  2010  . COLONOSCOPY    . PROSTATECTOMY  2009  . TONSILLECTOMY      Current Outpatient Medications  Medication Sig Dispense Refill  . amoxicillin-clavulanate (AUGMENTIN XR) 1000-62.5 MG 12 hr tablet Take 2 tablets by mouth 2 (two) times daily.    Marland Kitchen azaTHIOprine (IMURAN) 50 MG tablet Take 2 tablets by mouth twice daily (Patient taking differently: Take 100 mg by mouth in the morning and at bedtime. Take 2 tablets by mouth twice daily) 360 tablet 3  . chlorhexidine (PERIDEX) 0.12 % solution SMARTSIG:0.5 Ounce(s) By Mouth Twice Daily    . cholecalciferol (VITAMIN D3) 25 MCG (1000 UNIT) tablet Take 1,000 Units by mouth daily. 1 Tablet Daily    . clotrimazole-betamethasone (LOTRISONE) cream Apply 1 application topically 2 (two) times daily as needed (irritation).    Marland Kitchen dexamethasone 0.5 MG/5ML elixir FOR 3 DAYS RINSE WITH 15MLS FOR 2 MINS. 4 TIMES A DAY THEN SWALLOW. RINSE WITH 5 MLS FOR 2 MINS. 4 TIMES A DAY FOR 3 DAYS. THEN 5 MLS 4 TIMES A DAY FOR 2 MINS AND SWALLOW DO  THIS EVERY OTHER TIME FOR 3 DAYS. THEN 5 MLS 4 TIMES A DAY FOR 2 MINUTES & SPIT    . ELIQUIS 5 MG TABS tablet Take 1 tablet by mouth twice daily (Patient taking differently: Take 5 mg by mouth 2 (two) times daily.) 180 tablet 2  . Fish Oil OIL Take 1,000 mg by mouth daily.     . flecainide (TAMBOCOR) 50 MG tablet Take 1 tablet (50 mg total) by mouth 2 (two) times daily. 180 tablet 3  . gluconic acid-citric acid (RENACIDIN) irrigation Irrigate with 30 mLs as directed  every other day.    . Incontinence Supplies (CATHETER EXTENSION TUBING) MISC Inject 1 fluid ounce into the vein as needed. Use as directed    . Leuprolide Acetate (LUPRON IJ) Inject as directed as needed (Prostate cancer).     Marland Kitchen levothyroxine (SYNTHROID, LEVOTHROID) 150 MCG tablet Take 150 mcg by mouth daily before breakfast.    . lisinopril (PRINIVIL,ZESTRIL) 20 MG tablet Take 20 mg by mouth 2 (two) times a day.     . mirabegron ER (MYRBETRIQ) 50 MG TB24 tablet Take 50 mg by mouth as needed.    . Multiple Vitamin (MULTIVITAMIN) tablet Take 1 tablet by mouth daily.    Marland Kitchen nystatin ointment (MYCOSTATIN) Apply 1 application topically as needed.    Marland Kitchen omeprazole (PRILOSEC) 20 MG capsule Take 20 mg by mouth daily.     . pantoprazole (PROTONIX) 40 MG tablet Take 1 tablet (40 mg total) by mouth daily. 45 tablet 0  . rosuvastatin (CRESTOR) 5 MG tablet Take 2.5 mg by mouth daily.    . Water For Irrigation, Sterile (STERILE WATER FOR IRRIGATION) Irrigate with 1,000,000 mLs as directed as needed. Use as directed     No current facility-administered medications for this encounter.    No Known Allergies  Social History   Socioeconomic History  . Marital status: Married    Spouse name: Not on file  . Number of children: 2  . Years of education: Masters  . Highest education level: Not on file  Occupational History  . Occupation: Retired  Tobacco Use  . Smoking status: Former Smoker    Quit date: 02/01/1980    Years since quitting: 40.1  . Smokeless tobacco: Never Used  Vaping Use  . Vaping Use: Never used  Substance and Sexual Activity  . Alcohol use: Yes    Alcohol/week: 0.0 standard drinks    Comment: 2 glasses red wine daily  . Drug use: No  . Sexual activity: Not on file  Other Topics Concern  . Not on file  Social History Narrative   Lives in [place] with spouse.   Right-handed.   2 cups caffeine daily.   Retired from Database administrator   Social Determinants of  Radio broadcast assistant Strain: Not on file  Food Insecurity: Not on file  Transportation Needs: Not on file  Physical Activity: Not on file  Stress: Not on file  Social Connections: Not on file  Intimate Partner Violence: Not on file    Family History  Problem Relation Age of Onset  . Parkinson's disease Father   . Breast cancer Mother     ROS- All systems are reviewed and negative except as per the HPI above  Physical Exam: Vitals:   03/11/20 1416  BP: 128/60  Pulse: (!) 55  Weight: 87.6 kg  Height: 5\' 10"  (1.778 m)   Wt Readings from Last 3 Encounters:  03/11/20 87.6 kg  02/06/20 86.2 kg  01/20/20 87 kg    Labs: Lab Results  Component Value Date   NA 139 01/20/2020   K 4.9 01/20/2020   CL 100 01/20/2020   CO2 24 01/20/2020   GLUCOSE 104 (H) 01/20/2020   BUN 17 01/20/2020   CREATININE 1.07 01/20/2020   CALCIUM 9.9 01/20/2020   Lab Results  Component Value Date   INR 2.5 (H) 04/03/2008   No results found for: CHOL, HDL, LDLCALC, TRIG   GEN- The patient is well appearing, alert and oriented x 3 today.   Head- normocephalic, atraumatic Eyes-  Sclera clear, conjunctiva pink Ears- hearing intact Oropharynx- clear Neck- supple, no JVP Lymph- no cervical lymphadenopathy Lungs- Clear to ausculation bilaterally, normal work of breathing Heart- Regular rate and rhythm, no murmurs, rubs or gallops, PMI not laterally displaced GI- soft, NT, ND, + BS Extremities- no clubbing, cyanosis, or edema MS- no significant deformity or atrophy Skin- no rash or lesion Psych- euthymic mood, full affect Neuro- strength and sensation are intact  EKG-Sinus brady at 55 bpm, with first degree block,  pr int 216 ms, qrs int 90 ms, qtc 422 ms   Assessment and Plan: 1. Afib  S/p  one month ablation  Staying in [place]  Continue on flecainide 50 mg bid   2. CHA2DS2VASc score of 3  Continue eliquis 5 mg bid without interruption  3. HTN Stable     F/u with Dr. Rayann Heman  4/11  Geroge Baseman. Keylen Uzelac, [facility] [address] [employer] [place], [place] [address] [phone]

## 2020-03-18 DIAGNOSIS — C61 Malignant neoplasm of prostate: Secondary | ICD-10-CM | POA: Diagnosis not present

## 2020-03-26 DIAGNOSIS — Z9359 Other cystostomy status: Secondary | ICD-10-CM | POA: Diagnosis not present

## 2020-03-26 DIAGNOSIS — C61 Malignant neoplasm of prostate: Secondary | ICD-10-CM | POA: Diagnosis not present

## 2020-03-26 DIAGNOSIS — Z466 Encounter for fitting and adjustment of urinary device: Secondary | ICD-10-CM | POA: Diagnosis not present

## 2020-03-26 DIAGNOSIS — N529 Male erectile dysfunction, unspecified: Secondary | ICD-10-CM | POA: Diagnosis not present

## 2020-03-26 DIAGNOSIS — N35013 Post-traumatic anterior urethral stricture: Secondary | ICD-10-CM | POA: Diagnosis not present

## 2020-04-22 DIAGNOSIS — R7989 Other specified abnormal findings of blood chemistry: Secondary | ICD-10-CM | POA: Diagnosis not present

## 2020-04-22 DIAGNOSIS — C61 Malignant neoplasm of prostate: Secondary | ICD-10-CM | POA: Diagnosis not present

## 2020-04-22 DIAGNOSIS — G7000 Myasthenia gravis without (acute) exacerbation: Secondary | ICD-10-CM | POA: Diagnosis not present

## 2020-04-22 DIAGNOSIS — D649 Anemia, unspecified: Secondary | ICD-10-CM | POA: Diagnosis not present

## 2020-04-22 DIAGNOSIS — I480 Paroxysmal atrial fibrillation: Secondary | ICD-10-CM | POA: Diagnosis not present

## 2020-04-22 DIAGNOSIS — E039 Hypothyroidism, unspecified: Secondary | ICD-10-CM | POA: Diagnosis not present

## 2020-04-22 DIAGNOSIS — E785 Hyperlipidemia, unspecified: Secondary | ICD-10-CM | POA: Diagnosis not present

## 2020-04-22 DIAGNOSIS — M1990 Unspecified osteoarthritis, unspecified site: Secondary | ICD-10-CM | POA: Diagnosis not present

## 2020-04-22 DIAGNOSIS — I10 Essential (primary) hypertension: Secondary | ICD-10-CM | POA: Diagnosis not present

## 2020-04-22 DIAGNOSIS — L299 Pruritus, unspecified: Secondary | ICD-10-CM | POA: Diagnosis not present

## 2020-04-22 DIAGNOSIS — R5383 Other fatigue: Secondary | ICD-10-CM | POA: Diagnosis not present

## 2020-04-22 DIAGNOSIS — R7301 Impaired fasting glucose: Secondary | ICD-10-CM | POA: Diagnosis not present

## 2020-04-23 DIAGNOSIS — Z466 Encounter for fitting and adjustment of urinary device: Secondary | ICD-10-CM | POA: Diagnosis not present

## 2020-05-01 DIAGNOSIS — K921 Melena: Secondary | ICD-10-CM | POA: Diagnosis not present

## 2020-05-06 DIAGNOSIS — R300 Dysuria: Secondary | ICD-10-CM | POA: Diagnosis not present

## 2020-05-06 DIAGNOSIS — T83028A Displacement of other indwelling urethral catheter, initial encounter: Secondary | ICD-10-CM | POA: Diagnosis not present

## 2020-05-06 DIAGNOSIS — Z9359 Other cystostomy status: Secondary | ICD-10-CM | POA: Diagnosis not present

## 2020-05-08 ENCOUNTER — Telehealth: Payer: Self-pay | Admitting: Internal Medicine

## 2020-05-08 NOTE — Telephone Encounter (Signed)
Patient called states he had testing done with PCP and they found blood in stool and said he is anemic seeking advise

## 2020-05-11 ENCOUNTER — Ambulatory Visit (INDEPENDENT_AMBULATORY_CARE_PROVIDER_SITE_OTHER): Payer: Medicare Other | Admitting: Internal Medicine

## 2020-05-11 ENCOUNTER — Other Ambulatory Visit: Payer: Self-pay

## 2020-05-11 ENCOUNTER — Encounter: Payer: Self-pay | Admitting: Internal Medicine

## 2020-05-11 ENCOUNTER — Telehealth: Payer: Self-pay | Admitting: Internal Medicine

## 2020-05-11 VITALS — BP 132/62 | HR 64 | Ht 70.0 in | Wt 199.8 lb

## 2020-05-11 DIAGNOSIS — I10 Essential (primary) hypertension: Secondary | ICD-10-CM

## 2020-05-11 DIAGNOSIS — I480 Paroxysmal atrial fibrillation: Secondary | ICD-10-CM

## 2020-05-11 NOTE — Patient Instructions (Addendum)
Medication Instructions:  Your physician recommends that you continue on your current medications as directed. Please refer to the Current Medication list given to you today.  Labwork: None ordered.  Testing/Procedures: None ordered.  Follow-Up: Your physician wants you to follow-up in: 08/27/20 at 11:30 am with [name], MD    Any Other Special Instructions Will Be Listed Below (If Applicable).  If you need a refill on your cardiac medications before your next appointment, please call your pharmacy.

## 2020-05-11 NOTE — Telephone Encounter (Signed)
Pt c/o medication issue:  1. Name of Medication: CRANBERRY PO  2. How are you currently taking this medication (dosage and times per day)? 650 mg tablet daily  3. Are you having a reaction (difficulty breathing--STAT)? no  4. What is your medication issue? Patient states he did not remember the mg of cranberry that he takes while he was in the office today. He states he is taking one 650 mg tablet daily. He states he does not need a call back.

## 2020-05-11 NOTE — Telephone Encounter (Signed)
Patient reports that he is anemic and heme positive at his PCP Dr..  [name]'s office. He is scheduled to see Dr. Henrene Pastor 05/13/20 9:40

## 2020-05-11 NOTE — Progress Notes (Deleted)
PCP: [name], MD Primary Cardiologist: ***  Eugene Castaneda is a 75 y.o. male who presents today for routine electrophysiology followup.  Since his recent afib ablation, the patient reports doing very well.  he denies procedure related complications and is pleased with the results of the procedure.  Today, he denies symptoms of palpitations, chest pain, shortness of breath,  lower extremity edema, dizziness, presyncope, or syncope.  The patient is otherwise without complaint today.   Past Medical History:  Diagnosis Date  . CAD (coronary artery disease), native coronary artery 11/13/2017   Coronary calcification noted on CT scan   . Chronic kidney disease (CKD), stage III (moderate) ([place]) 11/13/2017  . Diplopia   . Foley catheter present    pt has supra-pubic foley catheter in place/since 2016  . GERD (gastroesophageal reflux disease)   . Hyperlipemia   . Hypertension   . Hypertensive heart disease without CHF 11/13/2017  . Hypothyroidism   . Impaired fasting glucose   . MG (myasthenia gravis) ([place])   . Paroxysmal atrial fibrillation ([place]) 11/13/2017   RF ablation Dr. [name] 2010 [facility] 3  . Prostate cancer ([place]) 2009  . Urethral obstruction   . Urinary retention    Suprapubic Catheter placed 04/25/15   Past Surgical History:  Procedure Laterality Date  . 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 ATRIAL FIBRILLATION ABLATION N/A 07/23/2019   Procedure: ATRIAL FIBRILLATION ABLATION;  Surgeon: [name], MD;  Location: [facility];  Service: Cardiovascular;  Laterality: N/A;  . ATRIAL FIBRILLATION ABLATION N/A 02/06/2020   Procedure: ATRIAL FIBRILLATION ABLATION;  Surgeon: [name], MD;  Location: [facility];  Service: Cardiovascular;  Laterality: N/A;  . catheter ablationfor afib  2010  . COLONOSCOPY    . PROSTATECTOMY  2009  .  TONSILLECTOMY      ROS- all systems are personally reviewed and negatives except as per HPI above  Current Outpatient Medications  Medication Sig Dispense Refill  . azaTHIOprine (IMURAN) 50 MG tablet Take 2 tablets by mouth twice daily 360 tablet 3  . cholecalciferol (VITAMIN D3) 25 MCG (1000 UNIT) tablet Take 1,000 Units by mouth daily. 1 Tablet Daily    . clotrimazole-betamethasone (LOTRISONE) cream Apply 1 application topically 2 (two) times daily as needed (irritation).    . CRANBERRY PO Take 1 tablet by mouth daily.    . Cyanocobalamin 5000 MCG CAPS Take 1 capsule by mouth daily.    Marland Kitchen ELIQUIS 5 MG TABS tablet Take 1 tablet by mouth twice daily 180 tablet 2  . ferrous sulfate 325 (65 FE) MG EC tablet Take 325 mg by mouth daily.    . Fish Oil OIL Take 1,000 mg by mouth daily.     . flecainide (TAMBOCOR) 50 MG tablet Take 1 tablet (50 mg total) by mouth 2 (two) times daily. 180 tablet 3  . gluconic acid-citric acid (RENACIDIN) irrigation Irrigate with 30 mLs as directed every other day.    . Incontinence Supplies (CATHETER EXTENSION TUBING) MISC Inject 1 fluid ounce into the vein as needed. Use as directed    . Leuprolide Acetate (LUPRON IJ) Inject as directed as needed (Prostate cancer).     Marland Kitchen levothyroxine (SYNTHROID, LEVOTHROID) 150 MCG tablet Take 150 mcg by mouth daily before breakfast.    . lisinopril (PRINIVIL,ZESTRIL) 20 MG tablet Take 20 mg by mouth 2 (two) times a  day.     . mirabegron ER (MYRBETRIQ) 50 MG TB24 tablet Take 50 mg by mouth as needed.    . Multiple Vitamin (MULTIVITAMIN) tablet Take 1 tablet by mouth daily.    Marland Kitchen nystatin ointment (MYCOSTATIN) Apply 1 application topically as needed.    Marland Kitchen omeprazole (PRILOSEC) 20 MG capsule Take 20 mg by mouth daily.     . rosuvastatin (CRESTOR) 5 MG tablet Take 2.5 mg by mouth daily.    . vitamin C (ASCORBIC ACID) 500 MG tablet Take 500 mg by mouth daily.    . Water For Irrigation, Sterile (STERILE WATER FOR IRRIGATION) Irrigate  with 1,000,000 mLs as directed as needed. Use as directed     No current facility-administered medications for this visit.    Physical Exam: Vitals:   05/11/20 1441  BP: 132/62  Pulse: 64  SpO2: 99%  Weight: 199 lb 12.8 oz (90.6 kg)  Height: 5\' 10"  (1.778 m)    GEN- The patient is well appearing, alert and oriented x 3 today.   Head- normocephalic, atraumatic Eyes-  Sclera clear, conjunctiva pink Ears- hearing intact Oropharynx- clear Lungs- Clear to ausculation bilaterally, normal work of breathing Heart- Regular rate and rhythm, no murmurs, rubs or gallops, PMI not laterally displaced GI- soft, NT, ND, + BS Extremities- no clubbing, cyanosis, or edema  EKG tracing ordered today is personally reviewed and shows sinus rhythm 64 bpm, PR 254 msec  Assessment and Plan:  1. Paroxysmal atrial fibrillation Doing well s/p ablation chads2vasc score is 3.  He is on eliquis  2. HTN Stable No change required today   Return to see me in 3 months  [name] MD, [facility] 05/11/2020 2:55 PM

## 2020-05-11 NOTE — Telephone Encounter (Signed)
Updated medication list

## 2020-05-11 NOTE — Progress Notes (Signed)
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 male who presents today for routine electrophysiology followup.  Since his recent afib ablation, the patient reports doing very well.   he denies procedure related complications and is pleased with the results of the procedure.  Today, he denies symptoms of chest pain, shortness of breath,  lower extremity edema, dizziness, presyncope, or syncope.  The patient is otherwise without complaint today.   Past Medical History:  Diagnosis Date  . CAD (coronary artery disease), native coronary artery 11/13/2017   Coronary calcification noted on CT scan   . Chronic kidney disease (CKD), stage III (moderate) ([place]) 11/13/2017  . Diplopia   . Foley catheter present    pt has supra-pubic foley catheter in place/since 2016  . GERD (gastroesophageal reflux disease)   . Hyperlipemia   . Hypertension   . Hypertensive heart disease without CHF 11/13/2017  . Hypothyroidism   . Impaired fasting glucose   . MG (myasthenia gravis) ([place])   . Paroxysmal atrial fibrillation ([place]) 11/13/2017   RF ablation Dr. [name] 2010 [facility] CHADSVASC 3  . Prostate cancer ([place]) 2009  . Urethral obstruction   . Urinary retention    Suprapubic Catheter placed 04/25/15   Past Surgical History:  Procedure Laterality Date  . 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 ATRIAL FIBRILLATION ABLATION N/A 07/23/2019   Procedure: ATRIAL FIBRILLATION ABLATION;  Surgeon: Eugene Grayer, MD;  Location: [facility];  Service: Cardiovascular;  Laterality: N/A;  . ATRIAL FIBRILLATION ABLATION N/A 02/06/2020   Procedure: ATRIAL FIBRILLATION ABLATION;  Surgeon: Eugene Grayer, MD;  Location: [facility];  Service: Cardiovascular;  Laterality: N/A;  . catheter ablationfor afib  2010  . COLONOSCOPY    . PROSTATECTOMY  2009  . TONSILLECTOMY       ROS- all systems are personally reviewed and negatives except as per HPI above  Current Outpatient Medications  Medication Sig Dispense Refill  . azaTHIOprine (IMURAN) 50 MG tablet Take 2 tablets by mouth twice daily 360 tablet 3  . cholecalciferol (VITAMIN D3) 25 MCG (1000 UNIT) tablet Take 1,000 Units by mouth daily. 1 Tablet Daily    . clotrimazole-betamethasone (LOTRISONE) cream Apply 1 application topically 2 (two) times daily as needed (irritation).    . CRANBERRY PO Take 1 tablet by mouth daily.    . Cyanocobalamin 5000 MCG CAPS Take 1 capsule by mouth daily.    Marland Kitchen ELIQUIS 5 MG TABS tablet Take 1 tablet by mouth twice daily 180 tablet 2  . ferrous sulfate 325 (65 FE) MG EC tablet Take 325 mg by mouth daily.    . Fish Oil OIL Take 1,000 mg by mouth daily.     . flecainide (TAMBOCOR) 50 MG tablet Take 1 tablet (50 mg total) by mouth 2 (two) times daily. 180 tablet 3  . gluconic acid-citric acid (RENACIDIN) irrigation Irrigate with 30 mLs as directed every other day.    . Incontinence Supplies (CATHETER EXTENSION TUBING) MISC Inject 1 fluid ounce into the vein as needed. Use as directed    . Leuprolide Acetate (LUPRON IJ) Inject as directed as needed (Prostate cancer).     Marland Kitchen levothyroxine (SYNTHROID, LEVOTHROID) 150 MCG tablet Take 150 mcg by mouth daily before breakfast.    . lisinopril (PRINIVIL,ZESTRIL) 20 MG tablet Take 20 mg by mouth 2 (  two) times a day.     . mirabegron ER (MYRBETRIQ) 50 MG TB24 tablet Take 50 mg by mouth as needed.    . Multiple Vitamin (MULTIVITAMIN) tablet Take 1 tablet by mouth daily.    Marland Kitchen nystatin ointment (MYCOSTATIN) Apply 1 application topically as needed.    Marland Kitchen omeprazole (PRILOSEC) 20 MG capsule Take 20 mg by mouth daily.     . rosuvastatin (CRESTOR) 5 MG tablet Take 2.5 mg by mouth daily.    . vitamin C (ASCORBIC ACID) 500 MG tablet Take 500 mg by mouth daily.    . Water For Irrigation, Sterile (STERILE WATER FOR IRRIGATION) Irrigate with 1,000,000  mLs as directed as needed. Use as directed     No current facility-administered medications for this visit.    Physical Exam: Vitals:   05/11/20 1441  BP: 132/62  Pulse: 64  SpO2: 99%  Weight: 199 lb 12.8 oz (90.6 kg)  Height: 5\' 10"  (1.778 m)    GEN- The patient is well appearing, alert and oriented x 3 today.   Head- normocephalic, atraumatic Eyes-  Sclera clear, conjunctiva pink Ears- hearing intact Oropharynx- clear Lungs- Clear to ausculation bilaterally, normal work of breathing Heart- Regular rate and rhythm, no murmurs, rubs or gallops, PMI not laterally displaced GI- soft, NT, ND, + BS Extremities- no clubbing, cyanosis, or edema  EKG tracing ordered today is personally reviewed and shows sinus rhythm 64 bpm, PR 254 msec  Assessment and Plan:  1. Paroxysmal atrial fibrillation Doing well s/p ablation chads2vasc score is 3.  He is on eliquis We discussed possibly stopping flecainide today.  He is reluctant to do so.  We therefore did not make any changes today  2. HTN Stable No change required today  3. Anemia He has been referred to GI   Return to see me in 3 months   I,Eugene Castaneda,acting as a Education administrator for Eugene Grayer, MD.,have documented all relevant documentation on the behalf of Eugene Grayer, MD,as directed by  Eugene Grayer, MD while in the presence of Eugene Grayer, MD.  I, Eugene Grayer, MD, have reviewed all documentation for this visit. The documentation on 05/11/20 for the exam, diagnosis, procedures, and orders are all accurate and complete.   Eugene Grayer MD, [facility] 05/11/2020 2:57 PM

## 2020-05-13 ENCOUNTER — Telehealth: Payer: Self-pay

## 2020-05-13 ENCOUNTER — Ambulatory Visit (INDEPENDENT_AMBULATORY_CARE_PROVIDER_SITE_OTHER): Payer: Medicare Other | Admitting: Internal Medicine

## 2020-05-13 ENCOUNTER — Encounter: Payer: Self-pay | Admitting: Internal Medicine

## 2020-05-13 ENCOUNTER — Other Ambulatory Visit (INDEPENDENT_AMBULATORY_CARE_PROVIDER_SITE_OTHER): Payer: Medicare Other

## 2020-05-13 VITALS — BP 128/70 | HR 64 | Ht 70.0 in | Wt 200.0 lb

## 2020-05-13 DIAGNOSIS — Z7901 Long term (current) use of anticoagulants: Secondary | ICD-10-CM

## 2020-05-13 DIAGNOSIS — R195 Other fecal abnormalities: Secondary | ICD-10-CM

## 2020-05-13 DIAGNOSIS — Z86010 Personal history of colonic polyps: Secondary | ICD-10-CM | POA: Diagnosis not present

## 2020-05-13 DIAGNOSIS — D649 Anemia, unspecified: Secondary | ICD-10-CM

## 2020-05-13 DIAGNOSIS — K219 Gastro-esophageal reflux disease without esophagitis: Secondary | ICD-10-CM

## 2020-05-13 LAB — CBC WITH DIFFERENTIAL/PLATELET
Basophils Absolute: 0 10*3/uL (ref 0.0–0.1)
Basophils Relative: 0.3 % (ref 0.0–3.0)
Eosinophils Absolute: 0.1 10*3/uL (ref 0.0–0.7)
Eosinophils Relative: 2.5 % (ref 0.0–5.0)
HCT: 32.5 % — ABNORMAL LOW (ref 39.0–52.0)
Hemoglobin: 10.6 g/dL — ABNORMAL LOW (ref 13.0–17.0)
Lymphocytes Relative: 18.6 % (ref 12.0–46.0)
Lymphs Abs: 0.7 10*3/uL (ref 0.7–4.0)
MCHC: 32.7 g/dL (ref 30.0–36.0)
MCV: 95.6 fl (ref 78.0–100.0)
Monocytes Absolute: 0.4 10*3/uL (ref 0.1–1.0)
Monocytes Relative: 12.4 % — ABNORMAL HIGH (ref 3.0–12.0)
Neutro Abs: 2.4 10*3/uL (ref 1.4–7.7)
Neutrophils Relative %: 66.2 % (ref 43.0–77.0)
Platelets: 343 10*3/uL (ref 150.0–400.0)
RBC: 3.4 Mil/uL — ABNORMAL LOW (ref 4.22–5.81)
RDW: 22.7 % — ABNORMAL HIGH (ref 11.5–15.5)
WBC: 3.6 10*3/uL — ABNORMAL LOW (ref 4.0–10.5)

## 2020-05-13 LAB — FERRITIN: Ferritin: 17.8 ng/mL — ABNORMAL LOW (ref 22.0–322.0)

## 2020-05-13 MED ORDER — SUTAB 1479-225-188 MG PO TABS: 1.0000 | ORAL_TABLET | Freq: Once | ORAL | 0 refills | Status: AC

## 2020-05-13 NOTE — Progress Notes (Signed)
HISTORY OF PRESENT ILLNESS:  Eugene HELDT is a 75 y.o. 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  Patient has been seen in this office for colorectal neoplasia surveillance program and GERD.  His past medical history is as listed below.  He has a history of paroxysmal atrial fibrillation for which she is on chronic Eliquis therapy.  Review of the patient's blood work from June 2021 shows a hemoglobin level of 14.0.  Repeat blood work December 2021 revealed hemoglobin of 12.0.  MCV 96.  Status post atrial fibrillation ablation procedure in June 2021 and again in January 2022.  Began to complain of fatigue.  Outside blood work has been obtained and reviewed.  Hemoglobin from April 22, 2020 was 9.3.  MCV 92.9.  Hemoccult testing May 01, 2020 was positive x3.  Patient also had positive Hemoccult testing in June 2021.  Patient denies melena or hematochezia though he is color blind.  He did undergo colonoscopy elsewhere and then here in June 2015 and most recently July 2020.  In addition to left-sided diverticulosis and internal hemorrhoids he was found to have diminutive adenomas.  Follow-up in 5 years recommended.  He did undergo upper endoscopy June 2015 to evaluate Hemoccult-positive stool and GERD.  GERD symptoms and dysphagia had resolved after PPI therapy.  Examination was essentially normal.  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  Review of outside x-ray file reveals changes of fatty liver on ultrasound June 2018.  Patient's GI review of systems is quite unremarkable.  He was placed on iron therapy in addition to vitamin C and has noticed that his issues with fatigue has improved.  He did meet with his cardiologist recently who provided permission for short-term discontinuation of his anticoagulation for anticipated GI procedures.  Patient has continued on  daily PPI in the form of omeprazole 20 mg.  He has completed his COVID vaccination series and booster.  REVIEW OF SYSTEMS:  All non-GI ROS negative unless otherwise stated in the HPI.  Past Medical History:  Diagnosis Date  . CAD (coronary artery disease), native coronary artery 11/13/2017   Coronary calcification noted on CT scan   . Chronic kidney disease (CKD), stage III (moderate) ([place]) 11/13/2017  . Diplopia   . Foley catheter present    pt has supra-pubic foley catheter in place/since 2016  . GERD (gastroesophageal reflux disease)   . Hyperlipemia   . Hypertension   . Hypertensive heart disease without CHF 11/13/2017  . Hypothyroidism   . Impaired fasting glucose   . MG (myasthenia gravis) ([place])   . Paroxysmal atrial fibrillation ([place]) 11/13/2017   RF ablation Dr. [name] 2010 [facility] CHADSVASC 3  . Prostate cancer ([place]) 2009  . Urethral obstruction   . Urinary retention    Suprapubic Catheter placed 04/25/15    Past Surgical History:  Procedure Laterality Date  . 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 ATRIAL FIBRILLATION ABLATION N/A 07/23/2019   Procedure: ATRIAL FIBRILLATION ABLATION;  Surgeon: [name], MD;  Location: [place] [facility];  Service: Cardiovascular;  Laterality: N/A;  . ATRIAL FIBRILLATION ABLATION N/A 02/06/2020   Procedure: ATRIAL FIBRILLATION ABLATION;  Surgeon: [name], MD;  Location: [place] [facility];  Service: Cardiovascular;  Laterality: N/A;  . catheter ablationfor afib  2010  .  COLONOSCOPY    . PROSTATECTOMY  2009  . TONSILLECTOMY      Social History EDDRICK DILONE  reports that he quit smoking about 40 years ago. He has never used smokeless tobacco. He reports current alcohol use. He reports that he does not use drugs.  family history includes Breast cancer in his mother; Parkinson's disease in his father.  No Known  Allergies     PHYSICAL EXAMINATION: Vital signs: BP 128/70   Pulse 64   Ht 5\' 10"  (1.778 m)   Wt 200 lb (90.7 kg)   BMI 28.70 kg/m   Constitutional: generally well-appearing, no acute distress Psychiatric: alert and oriented x3, cooperative Eyes: extraocular movements intact, anicteric, conjunctiva pink Mouth: oral pharynx moist, no lesions Neck: supple no lymphadenopathy Cardiovascular: heart regular rate and rhythm, no murmur Lungs: clear to auscultation bilaterally Abdomen: soft, nontender, nondistended, no obvious ascites, no peritoneal signs, normal bowel sounds, no organomegaly Rectal: Deferred until colonoscopy Extremities: no clubbing or cyanosis.  Trace lower extremity edema bilaterally Skin: no lesions on visible extremities Neuro: No focal deficits.  Cranial nerves intact  ASSESSMENT:  1.  Normocytic anemia and Hemoccult-positive stool.  Rule out GI mucosal pathology 2.  Chronic GERD.  Asymptomatic on PPI.  EGD 2016 was unremarkable 3.  History of adenomatous colon polyps.  Surveillance up-to-date.  Last colonoscopy July 2020 4.  History of atrial fibrillation status post ablation therapy x2.  On chronic Eliquis therapy   PLAN:  1.  Colonoscopy to evaluate anemia and Hemoccult-positive stool.  Patient is HIGH RISK given his comorbidities and the need to address anticoagulation therapy. 2.  Upper endoscopy to evaluate anemia and Hemoccult-positive stool.  High risk as above. 3.  Hold anticoagulation 2 days prior to the procedure.  Would anticipate resumption of anticoagulation immediately post procedure.  We will confer with his cardiologist, in this regard. 4.  Continue PPI 5.  Hold iron therapy 5 to 7 days prior to his procedures to assist with optimal bowel preparation. 6.  CBC and ferritin level today.  If no change, check B12 and folate levels. 7.  If the above work-up unrevealing, then would proceed with capsule endoscopy.  We discussed this in detail. 8.   Ongoing general medical care with Dr. Dagmar Hait A total time of 50 minutes was spent preparing to see the patient, reviewing tests and x-rays, obtaining comprehensive history, performing medically appropriate physical examination, counseling the patient regarding his above listed issues, ordering advanced procedures and laboratories, and documenting clinical information in the health record

## 2020-05-13 NOTE — Telephone Encounter (Signed)
[facility] Pre-operative Risk Assessment     Request for surgical clearance:     Endoscopy Procedure  What type of surgery is being performed?     Colon/Endo  When is this surgery scheduled?     08/12/2020  What type of clearance is required ?   Pharmacy  Are there any medications that need to be held prior to surgery and how long? Eliquis - 2 days  Practice name and name of physician performing surgery?      [facility]  What is your office phone and fax number?      Phone- [phone]  [phone]  Anesthesia type (None, local, MAC, general) ?       MAC

## 2020-05-13 NOTE — Patient Instructions (Signed)
Your provider has requested that you go to the basement level for lab work before leaving today. Press "B" on the elevator. The lab is located at the first door on the left as you exit the elevator.  You have been scheduled for an endoscopy and colonoscopy. Please follow the written instructions given to you at your visit today. Please pick up your prep supplies at the pharmacy within the next 1-3 days. If you use inhalers (even only as needed), please bring them with you on the day of your procedure.   

## 2020-05-14 ENCOUNTER — Telehealth: Payer: Self-pay

## 2020-05-14 NOTE — Telephone Encounter (Signed)
Please comment on Eliquis. Thanks! 

## 2020-05-14 NOTE — Telephone Encounter (Signed)
Patient with diagnosis of afib on Eliquis for anticoagulation.    Procedure: colonoscopy/endoscopy Date of procedure: 08/12/20  CHA2DS2-VASc Score = 4  This indicates a 4.8% annual risk of stroke. The patient's score is based upon: CHF History: No HTN History: Yes Diabetes History: No Stroke History: No Vascular Disease History: Yes Age Score: 2 Gender Score: 0  Pt underwent afib ablation on 02/06/20 and has since completed 3 months of anticoag.  CrCl 55mL/min Platelet count 343K  Per office protocol, patient can hold Eliquis for 2 days prior to procedure.

## 2020-05-14 NOTE — Telephone Encounter (Signed)
I have updated appts notes to reflect need pre op clearance. Will send notes to MD for upcoming appt. Will send FYI to requesting office pt has appt.

## 2020-05-14 NOTE — Telephone Encounter (Signed)
Per pharmacy OK to hold Eliquis 2 days prior to procedure. Colonoscopy/endoscopy scheduled for July, 2022. Patient has upcoming appointment Jun 01, 2020 with Dr. Harrell Gave, at which time pre-op evaluation can be addressed.  Will route to Triage team to add pre-op eval to visit.

## 2020-05-14 NOTE — Telephone Encounter (Signed)
Spoke with patient and let him know that he could hold his Eliquis for 2 days prior to his procedure.  Patient agreed.

## 2020-05-21 DIAGNOSIS — Z4803 Encounter for change or removal of drains: Secondary | ICD-10-CM | POA: Diagnosis not present

## 2020-05-27 ENCOUNTER — Encounter: Payer: Self-pay | Admitting: Cardiology

## 2020-05-27 NOTE — Telephone Encounter (Signed)
error 

## 2020-06-01 ENCOUNTER — Ambulatory Visit: Payer: Medicare Other | Admitting: Cardiology

## 2020-06-15 NOTE — Progress Notes (Signed)
Cardiology Office Note:    Date:  06/17/2020   ID:  Eugene Castaneda, DOB 12-Feb-1945, MRN [id number]  PCP:  [name], MD  Cardiologist:  Buford Dresser, MD   Referring MD: [name], MD   CC: Follow up and Pre-op clearance  History of Present Illness:    Eugene Castaneda is a 75 y.o. male with a hx of paroxysmal atrial fibrillation, hypertension, CAD without angina, sick sinus syndrome, hypothyroidism, hyperlipidemia who is seen for follow up. I initially saw him 11/27/2017  as a new patient to [facility] (previously followed by [name]) at the request of [name], [name], MD for the evaluation and management of paroxysmal afib.   Cardiac history: paroxysmal afib, on flecainide "for many years," reports prior treadmill tests that I do not have access to. Has declined anticoagulation in the past but was amenable to staring 01/2019. Uses [employer] intermittently to evaluate for afib.  Today: Planned surgery: GI endoscopy to evaluate anemia  Pertinent past cardiac history: Atrial fibrillation ablation (07/2019, 02/2020) Prior cardiac workup: ETT, MPI, echo in the past History of valve disease: no significant valve disease on echo in 2021 History of CAD/PAD/CVA/TIA: CAD presumed based on calcium score, but suspected nonobstructive as no history of positive stress test or intervention History of heart failure: none History of arrhythmia: Paroxysmal Atrial Fibrillation On anticoagulation: Eliquis 5 MG History of hypertension: Hypertension, hypertensive heart disease without CHF History of diabetes: no History of CKD: Stage III (moderate) History of anesthesia complications: none Current symptoms: none Functional capacity: greater than 4 METs  On 02/07/2020 he underwent Afib ablation and there are no complications. The first 3 months after surgery he only noticed a couple episodes of Afib. He reports having the option to discontinue flecainide but has not  done so at this time. We discussed this and other options for his current regimen.  Prior to his stress test in 12/2019 he was having shortness of breath while mowing the lawn. He no longer does this and therefore has not noticed any shortness of breath. However, he does swim for exercise several times a week.  He denies any chest pain, or exertional symptoms. No headaches, lightheadedness, or syncope to report. Also has no lower extremity edema, orthopnea or PND. He has not noticed any blood in his stool.  Currently he takes supplements Iron 325 mg and Vitamin C.  Past Medical History:  Diagnosis Date  . CAD (coronary artery disease), native coronary artery 11/13/2017   Coronary calcification noted on CT scan   . Chronic kidney disease (CKD), stage III (moderate) ([place]) 11/13/2017  . Diplopia   . Foley catheter present    pt has supra-pubic foley catheter in place/since 2016  . GERD (gastroesophageal reflux disease)   . Hyperlipemia   . Hypertension   . Hypertensive heart disease without CHF 11/13/2017  . Hypothyroidism   . Impaired fasting glucose   . MG (myasthenia gravis) ([place])   . Paroxysmal atrial fibrillation ([place]) 11/13/2017   RF ablation Dr. [name] 2010 [facility] CHADSVASC 3  . Prostate cancer ([place]) 2009  . Urethral obstruction   . Urinary retention    Suprapubic Catheter placed 04/25/15    Past Surgical History:  Procedure Laterality Date  . 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 ATRIAL FIBRILLATION ABLATION N/A 07/23/2019   Procedure: ATRIAL FIBRILLATION ABLATION;  Surgeon: [name], MD;  Location: [facility]  INVASIVE CV LAB;  Service: Cardiovascular;  Laterality: N/A;  . ATRIAL FIBRILLATION ABLATION N/A 02/06/2020   Procedure: ATRIAL FIBRILLATION ABLATION;  Surgeon: [name], MD;  Location: [place] CV LAB;  Service: Cardiovascular;  Laterality: N/A;  . catheter  ablationfor afib  2010  . COLONOSCOPY    . PROSTATECTOMY  2009  . TONSILLECTOMY      Current Medications: Current Outpatient Medications on File Prior to Visit  Medication Sig  . azaTHIOprine (IMURAN) 50 MG tablet Take 2 tablets by mouth twice daily  . cholecalciferol (VITAMIN D3) 25 MCG (1000 UNIT) tablet Take 1,000 Units by mouth daily. 1 Tablet Daily  . clotrimazole-betamethasone (LOTRISONE) cream Apply 1 application topically 2 (two) times daily as needed (irritation).  . CRANBERRY PO Take 1 tablet by mouth daily. 650 mg daily  . Cyanocobalamin 5000 MCG CAPS Take 1 capsule by mouth daily.  Marland Kitchen ELIQUIS 5 MG TABS tablet Take 1 tablet by mouth twice daily  . ferrous sulfate 325 (65 FE) MG EC tablet Take 325 mg by mouth daily.  . Fish Oil OIL Take 1,000 mg by mouth daily.   Marland Kitchen gluconic acid-citric acid (RENACIDIN) irrigation Irrigate with 30 mLs as directed every other day.  . Incontinence Supplies (CATHETER EXTENSION TUBING) MISC Inject 1 fluid ounce into the vein as needed. Use as directed  . Leuprolide Acetate (LUPRON IJ) Inject as directed as needed (Prostate cancer).   Marland Kitchen levothyroxine (SYNTHROID, LEVOTHROID) 150 MCG tablet Take 150 mcg by mouth daily before breakfast.  . lisinopril (PRINIVIL,ZESTRIL) 20 MG tablet Take 20 mg by mouth 2 (two) times a day.   . mirabegron ER (MYRBETRIQ) 50 MG TB24 tablet Take 50 mg by mouth as needed.  . Multiple Vitamin (MULTIVITAMIN) tablet Take 1 tablet by mouth daily.  Marland Kitchen nystatin ointment (MYCOSTATIN) Apply 1 application topically as needed.  Marland Kitchen omeprazole (PRILOSEC) 20 MG capsule Take 20 mg by mouth daily.   . rosuvastatin (CRESTOR) 5 MG tablet Take 2.5 mg by mouth daily.  . vitamin C (ASCORBIC ACID) 500 MG tablet Take 500 mg by mouth daily.  . Water For Irrigation, Sterile (STERILE WATER FOR IRRIGATION) Irrigate with 1,000,000 mLs as directed as needed. Use as directed   No current facility-administered medications on file prior to visit.      Allergies:   Patient has no known allergies.   Social History   Tobacco Use  . Smoking status: Former Smoker    Quit date: 02/01/1980    Years since quitting: 40.4  . Smokeless tobacco: Never Used  Vaping Use  . Vaping Use: Never used  Substance Use Topics  . Alcohol use: Yes    Alcohol/week: 0.0 standard drinks    Comment: 2 glasses red wine daily  . Drug use: No    Family History: The patient's family history includes Breast cancer in his mother; Parkinson's disease in his father.  ROS:   Please see the history of present illness.   Additional pertinent ROS otherwise unremarkable.   EKGs/Labs/Other Studies Reviewed:    The following studies were reviewed today:  Afib Ablation 02/06/2020: 1. Sinus rhythm upon presentation.   2. Intracardiac echo reveals a moderate sized left atrium with four separate pulmonary veins without evidence of pulmonary vein stenosis. 3. All four pulmonary veins were quiescent from the prior ablations.  There was a minor amount of electrical activity along the posterior bottom portion of the right inferior pulmonary vein.   4. Right inferior pulmonary vein isolation achieved with  ablation.  5. Additional left atrial ablation was performed with a standard box lesion created along the posterior wall of the left atrium 6.   No arrhythmias were induced during isuprel infusion.  No PACs, PVCs, atrial tachycardia, atrial flutter, or other arrhythmias were observed today. 7. CTI block confirmed from a prior ablation 8. No early apparent complications.  ETT 12/17/2019:   Blood pressure demonstrated a normal response to exercise.  There was no ST segment deviation noted during stress.   ETT with fair exercise tolerance (6:43); no chest pain; normal blood pressure response; no diagnostic ST changes; negative adequate exercise tolerance test.  Afib Ablation 07/23/2019: 1. Sinus rhythm upon presentation.   2. Intracardiac echo reveals a normal sized  left atrium with four separate pulmonary veins without evidence of pulmonary vein stenosis. 3.  The left pulmonary veins were quiescent from the prior ablation and did not require additional ablation today.  There was minor return of electrical activity along the anterior carina between the right superior and inferior pulmonary veins which was successfully ablated today. 4. Ectopic atrial tachycardia successfully ablated along the anteroseptal right atrium 5. Isthmus dependant atrial flutter successfully ablated along the CTI with isthmus block achieved 6. Atrial fibrillation also observed during isuprel with multiple atypical atrial flutter circuits observed, not suitable for mapping or ablation today. 7. Successful cardioversion to sinus rhythm 8. No early apparent complications.  Echo 07/03/2019: 1. Left ventricular ejection fraction, by estimation, is 60 to 65%. The  left ventricle has normal function. The left ventricle has no regional  wall motion abnormalities. Left ventricular diastolic parameters are  consistent with Grade I diastolic  dysfunction (impaired relaxation). The average left ventricular global  longitudinal strain is -19.6 %. The global longitudinal strain is normal.  2. Right ventricular systolic function is normal. The right ventricular  size is mildly enlarged. There is normal pulmonary artery systolic  pressure. The estimated right ventricular systolic pressure is 22.9 mmHg.  3. The mitral valve is normal in structure. Trivial mitral valve  regurgitation. No evidence of mitral stenosis.  4. The aortic valve is tricuspid. Aortic valve regurgitation is trivial.  Mild aortic valve sclerosis is present, with no evidence of aortic valve  stenosis. Aortic regurgitation PHT measures 1615 msec.  5. Aortic dilatation noted. There is mild dilatation of the ascending  aorta measuring 38 mm.  6. The inferior vena cava is normal in size with greater than 50%  respiratory  variability, suggesting right atrial pressure of 3 mmHg.   Per Dr. York Spaniel notes: treadmill myoview, 04/18/2013.  He did 9 minutes, up to stage II Bruce, for a workload total of 10 METs. Heart rate went from 62 bpm to 144 bpm. No significant ST changes or arrhythmia noted. Imaging suggests a low risk study, with inferolateral ischemia vs more likely artifact, EF 78%.  Echo 10/12/2015 Mild cLVH with normal WM. EF 55% Moderate LAE  Mild RAE Trace AR Mild MR Trace TR and PR IVC dilated, <50% RC  RF ablation 03/2008 Treadmill 05/2003, 03/2008, 03/2013 Echo 03/2003  EKG:   06/17/2020: Sinus rhythm, bradycardic, rate 58 bpm, iRBBB 10/23/19: sinus rhythm, 1st degree AV block, incomplete RBBB at rate of 63 bpm.  Recent Labs: 01/20/2020: BUN 17; Creatinine, Ser 1.07; Potassium 4.9; Sodium 139 05/13/2020: Hemoglobin 10.6; Platelets 343.0  Recent Lipid Panel No results found for: CHOL, TRIG, HDL, CHOLHDL, VLDL, LDLCALC, LDLDIRECT   Physical Exam:    VS:  BP 132/64   Pulse (!) 58  Ht 5\' 10"  (1.778 m)   Wt 198 lb (89.8 kg)   SpO2 100%   BMI 28.41 kg/m     Wt Readings from Last 3 Encounters:  06/17/20 198 lb (89.8 kg)  05/13/20 200 lb (90.7 kg)  05/11/20 199 lb 12.8 oz (90.6 kg)    GEN: Well nourished, well developed in no acute distress HEENT: Normal, moist mucous membranes NECK: No JVD CARDIAC: regular rhythm, normal S1 and S2, no rubs or gallops. No murmur. VASCULAR: Radial and DP pulses 2+ bilaterally. No carotid bruits RESPIRATORY:  Clear to auscultation without rales, wheezing or rhonchi  ABDOMEN: Soft, non-tender, non-distended MUSCULOSKELETAL:  Ambulates independently SKIN: Warm and dry, no edema NEUROLOGIC:  Alert and oriented x 3. No focal neuro deficits noted. PSYCHIATRIC:  Normal affect   ASSESSMENT:    1. Preop cardiovascular exam   2. Coronary artery calcification seen on CT scan   3. Paroxysmal atrial fibrillation (HCC)   4. Secondary hypercoagulable state ([place])    5. Essential hypertension   6. Cardiac risk counseling   7. Counseling on health promotion and disease prevention   8. Mixed hyperlipidemia   9. Coronary artery disease involving native coronary artery of native heart without angina pectoris    PLAN:    Paroxysmal atrial fibrillation (HCC) We discussed stopping flecainide today. Given his elevated calcium score and his lack of symptoms since ablation, I would recommend stopping it for now. If symptoms recur, may need to consider risk/benefit of restarting flecainide vs other management options. Did have unremarkable ETT in the fall  CHA2DS2/VAS Stroke Risk Points  Current as of 28 minutes ago     5 >= 2 Points: High Risk  1 - 1.99 Points: Medium Risk  0 Points: Low Risk    No Change      Details    This score determines the patient's risk of having a stroke if the  patient has atrial fibrillation.       Points Metrics  0 Has Congestive Heart Failure:  No    Current as of 28 minutes ago  1 Has Vascular Disease:  Yes    Current as of 28 minutes ago  1 Has Hypertension:  Yes    Current as of 28 minutes ago  2 Age:  73    Current as of 28 minutes ago  1 Has Diabetes:  Yes     Current as of 28 minutes ago  0 Had Stroke:  No  Had TIA:  No  Had Thromboembolism:  No    Current as of 28 minutes ago  0 Male:  No    Current as of 28 minutes ago     -continue apixaban, has instructions to hold prior to endoscopy. Anemia followed by his PCP.      Mixed hyperlipidemia Discussed in the context of coronary calcium. Last LDL 68, goal <70. Due for recheck this summer. Has had high TG in the past, last 105.  CAD (coronary artery disease), native coronary artery Swims for >1 hour three times/week without limitations. No angina. Recent stress unremarkable. On statin, LDL at goal. No aspirin. ETT reviewed.  Essential hypertension Well controlled, continue lisinopril.  Preop cardiovascular exam Based on available date, patient's RCRI  score = 0, which carries a 3.9% 30-day risk of death, MI, or cardiac arrest.  The patient is not currently having active cardiac symptoms, and they can achieve >4 METs of activity.  According to ACC/AHA Guidelines, no further testing is  needed.  Proceed with surgery at acceptable risk.  Our service is available as needed in the peri-operative period.   The patient should restart apixaban when clear from GI perspective.  Fatigue, most notably on exertion: -no chest pain or specific shortness of breath -improved post atrial fib ablation  CV risk and prevention counseling: -recommend heart healthy/Mediterranean diet, with whole grains, fruits, vegetable, fish, lean meats, nuts, and olive oil. Limit salt. -recommend moderate walking, 3-5 times/week for 30-50 minutes each session. Aim for at least 150 minutes.week. Goal should be pace of 3 miles/hours, or walking 1.5 miles in 30 minutes -recommend avoidance of tobacco products. Avoid excess alcohol.  Plan for follow up: 6 mos or sooner with me as needed.   Buford Dresser, MD, PhD [place]  [facility]   Medication Adjustments/Labs and Tests Ordered: Current medicines are reviewed at length with the patient today.  Concerns regarding medicines are outlined above.  Orders Placed This Encounter  Procedures  . EKG 12-Lead   No orders of the defined types were placed in this encounter.   Patient Instructions  Medication Instructions:  Your Physician recommend you continue on your current medication as directed.    *If you need a refill on your cardiac medications before your next appointment, please call your pharmacy*   Lab Work: None   Testing/Procedures: None   Follow-Up: At [facility], you and your health needs are our priority.  As part of our continuing mission to provide you with exceptional heart care, we have created designated Provider Care Teams.  These Care Teams include your primary Cardiologist  (physician) and Advanced Practice Providers (APPs -  Physician Assistants and Nurse Practitioners) who all work together to provide you with the care you need, when you need it.  We recommend signing up for the patient portal called "MyChart".  Sign up information is provided on this After Visit Summary.  MyChart is used to connect with patients for Virtual Visits (Telemedicine).  Patients are able to view lab/test results, encounter notes, upcoming appointments, etc.  Non-urgent messages can be sent to your provider as well.   To learn more about what you can do with MyChart, go to [email or web address].    Your next appointment:   6 month(s) @ [address] [place], [place] [address]   The format for your next appointment:   In Person  Provider:   Buford Dresser, MD         [facility],acting as a scribe for Buford Dresser, MD.,have documented all relevant documentation on the behalf of Buford Dresser, MD,as directed by  Buford Dresser, MD while in the presence of Buford Dresser, MD. I, Buford Dresser, MD, have reviewed all documentation for this visit. The documentation on 06/17/20 for the exam, diagnosis, procedures, and orders are all accurate and complete.  Signed, Buford Dresser, MD PhD 06/17/2020 5:43 PM    [place]

## 2020-06-17 ENCOUNTER — Ambulatory Visit (INDEPENDENT_AMBULATORY_CARE_PROVIDER_SITE_OTHER): Payer: Medicare Other | Admitting: Cardiology

## 2020-06-17 ENCOUNTER — Other Ambulatory Visit: Payer: Self-pay

## 2020-06-17 ENCOUNTER — Encounter: Payer: Self-pay | Admitting: Cardiology

## 2020-06-17 VITALS — BP 132/64 | HR 58 | Ht 70.0 in | Wt 198.0 lb

## 2020-06-17 DIAGNOSIS — D6869 Other thrombophilia: Secondary | ICD-10-CM | POA: Diagnosis not present

## 2020-06-17 DIAGNOSIS — E782 Mixed hyperlipidemia: Secondary | ICD-10-CM | POA: Diagnosis not present

## 2020-06-17 DIAGNOSIS — I10 Essential (primary) hypertension: Secondary | ICD-10-CM | POA: Diagnosis not present

## 2020-06-17 DIAGNOSIS — I2510 Atherosclerotic heart disease of native coronary artery without angina pectoris: Secondary | ICD-10-CM | POA: Diagnosis not present

## 2020-06-17 DIAGNOSIS — I480 Paroxysmal atrial fibrillation: Secondary | ICD-10-CM

## 2020-06-17 DIAGNOSIS — Z7189 Other specified counseling: Secondary | ICD-10-CM | POA: Diagnosis not present

## 2020-06-17 DIAGNOSIS — Z01810 Encounter for preprocedural cardiovascular examination: Secondary | ICD-10-CM | POA: Insufficient documentation

## 2020-06-17 NOTE — Assessment & Plan Note (Addendum)
We discussed stopping flecainide today. Given his elevated calcium score and his lack of symptoms since ablation, I would recommend stopping it for now. If symptoms recur, may need to consider risk/benefit of restarting flecainide vs other management options. Did have unremarkable ETT in the fall  CHA2DS2/VAS Stroke Risk Points  Current as of 28 minutes ago     5 >= 2 Points: High Risk  1 - 1.99 Points: Medium Risk  0 Points: Low Risk    No Change      Details    This score determines the patient's risk of having a stroke if the  patient has atrial fibrillation.       Points Metrics  0 Has Congestive Heart Failure:  No    Current as of 28 minutes ago  1 Has Vascular Disease:  Yes    Current as of 28 minutes ago  1 Has Hypertension:  Yes    Current as of 28 minutes ago  2 Age:  75    Current as of 28 minutes ago  1 Has Diabetes:  Yes     Current as of 28 minutes ago  0 Had Stroke:  No  Had TIA:  No  Had Thromboembolism:  No    Current as of 28 minutes ago  0 Male:  No    Current as of 28 minutes ago     -continue apixaban, has instructions to hold prior to endoscopy. Anemia followed by his PCP.

## 2020-06-17 NOTE — Patient Instructions (Signed)
Medication Instructions:  Your Physician recommend you continue on your current medication as directed.    *If you need a refill on your cardiac medications before your next appointment, please call your pharmacy*   Lab Work: None   Testing/Procedures: None   Follow-Up: At [facility], you and your health needs are our priority.  As part of our continuing mission to provide you with exceptional heart care, we have created designated Provider Care Teams.  These Care Teams include your primary Cardiologist (physician) and Advanced Practice Providers (APPs -  Physician Assistants and Nurse Practitioners) who all work together to provide you with the care you need, when you need it.  We recommend signing up for the patient portal called "MyChart".  Sign up information is provided on this After Visit Summary.  MyChart is used to connect with patients for Virtual Visits (Telemedicine).  Patients are able to view lab/test results, encounter notes, upcoming appointments, etc.  Non-urgent messages can be sent to your provider as well.   To learn more about what you can do with MyChart, go to [email or web address].    Your next appointment:   6 month(s) @ [address] [place], [place] [address]   The format for your next appointment:   In Person  Provider:   Bridgette Christopher, MD    

## 2020-06-17 NOTE — Assessment & Plan Note (Addendum)
Based on available date, patient's RCRI score = 0, which carries a 3.9% 30-day risk of death, MI, or cardiac arrest.  The patient is not currently having active cardiac symptoms, and they can achieve >4 METs of activity.  According to ACC/AHA Guidelines, no further testing is needed.  Proceed with surgery at acceptable risk.  Our service is available as needed in the peri-operative period.   The patient should restart apixaban when clear from GI perspective.

## 2020-06-17 NOTE — Assessment & Plan Note (Signed)
Well controlled, continue lisinopril.

## 2020-06-17 NOTE — Assessment & Plan Note (Signed)
Discussed in the context of coronary calcium. Last LDL 68, goal <70. Due for recheck this summer. Has had high TG in the past, last 105.

## 2020-06-17 NOTE — Assessment & Plan Note (Addendum)
Swims for >1 hour three times/week without limitations. No angina. Recent stress unremarkable. On statin, LDL at goal. No aspirin. ETT reviewed.

## 2020-06-18 DIAGNOSIS — C61 Malignant neoplasm of prostate: Secondary | ICD-10-CM | POA: Diagnosis not present

## 2020-06-25 DIAGNOSIS — H2513 Age-related nuclear cataract, bilateral: Secondary | ICD-10-CM | POA: Diagnosis not present

## 2020-06-25 DIAGNOSIS — H524 Presbyopia: Secondary | ICD-10-CM | POA: Diagnosis not present

## 2020-07-03 NOTE — Telephone Encounter (Signed)
Can you comment on this? I typically prefer not to use flecainide with elevated Ca score if we don't have to, and he had ablation earlier this year. If you think it's the best option for him we can restart.

## 2020-07-06 DIAGNOSIS — Z20822 Contact with and (suspected) exposure to covid-19: Secondary | ICD-10-CM | POA: Diagnosis not present

## 2020-07-06 MED ORDER — FLECAINIDE ACETATE 50 MG PO TABS: 50.0000 mg | ORAL_TABLET | Freq: Two times a day (BID) | ORAL | 1 refills | Status: DC

## 2020-07-06 NOTE — Telephone Encounter (Signed)
Yes, 50 mg twice daily is the flecainide dose. Thank you!

## 2020-07-14 ENCOUNTER — Other Ambulatory Visit: Payer: Self-pay

## 2020-07-16 DIAGNOSIS — Z4803 Encounter for change or removal of drains: Secondary | ICD-10-CM | POA: Diagnosis not present

## 2020-07-20 DIAGNOSIS — L819 Disorder of pigmentation, unspecified: Secondary | ICD-10-CM | POA: Diagnosis not present

## 2020-07-20 DIAGNOSIS — L814 Other melanin hyperpigmentation: Secondary | ICD-10-CM | POA: Diagnosis not present

## 2020-07-20 DIAGNOSIS — D229 Melanocytic nevi, unspecified: Secondary | ICD-10-CM | POA: Diagnosis not present

## 2020-07-20 DIAGNOSIS — L821 Other seborrheic keratosis: Secondary | ICD-10-CM | POA: Diagnosis not present

## 2020-07-20 DIAGNOSIS — L905 Scar conditions and fibrosis of skin: Secondary | ICD-10-CM | POA: Diagnosis not present

## 2020-07-20 DIAGNOSIS — L570 Actinic keratosis: Secondary | ICD-10-CM | POA: Diagnosis not present

## 2020-07-20 DIAGNOSIS — Z85828 Personal history of other malignant neoplasm of skin: Secondary | ICD-10-CM | POA: Diagnosis not present

## 2020-07-30 ENCOUNTER — Encounter: Payer: Medicare Other | Admitting: Internal Medicine

## 2020-08-12 ENCOUNTER — Encounter: Payer: Self-pay | Admitting: Internal Medicine

## 2020-08-12 ENCOUNTER — Ambulatory Visit (AMBULATORY_SURGERY_CENTER): Payer: Medicare Other | Admitting: Internal Medicine

## 2020-08-12 ENCOUNTER — Other Ambulatory Visit: Payer: Self-pay

## 2020-08-12 ENCOUNTER — Other Ambulatory Visit (INDEPENDENT_AMBULATORY_CARE_PROVIDER_SITE_OTHER): Payer: Medicare Other

## 2020-08-12 VITALS — BP 111/60 | HR 62 | Temp 97.8°F | Resp 12 | Ht 70.0 in | Wt 200.0 lb

## 2020-08-12 DIAGNOSIS — K219 Gastro-esophageal reflux disease without esophagitis: Secondary | ICD-10-CM

## 2020-08-12 DIAGNOSIS — D649 Anemia, unspecified: Secondary | ICD-10-CM

## 2020-08-12 DIAGNOSIS — K648 Other hemorrhoids: Secondary | ICD-10-CM | POA: Diagnosis not present

## 2020-08-12 DIAGNOSIS — R195 Other fecal abnormalities: Secondary | ICD-10-CM

## 2020-08-12 DIAGNOSIS — K5730 Diverticulosis of large intestine without perforation or abscess without bleeding: Secondary | ICD-10-CM

## 2020-08-12 DIAGNOSIS — K3189 Other diseases of stomach and duodenum: Secondary | ICD-10-CM | POA: Diagnosis not present

## 2020-08-12 DIAGNOSIS — D509 Iron deficiency anemia, unspecified: Secondary | ICD-10-CM

## 2020-08-12 DIAGNOSIS — D123 Benign neoplasm of transverse colon: Secondary | ICD-10-CM

## 2020-08-12 DIAGNOSIS — K449 Diaphragmatic hernia without obstruction or gangrene: Secondary | ICD-10-CM | POA: Diagnosis not present

## 2020-08-12 DIAGNOSIS — K200 Eosinophilic esophagitis: Secondary | ICD-10-CM | POA: Diagnosis not present

## 2020-08-12 DIAGNOSIS — D132 Benign neoplasm of duodenum: Secondary | ICD-10-CM

## 2020-08-12 LAB — FERRITIN: Ferritin: 38 ng/mL (ref 22.0–322.0)

## 2020-08-12 MED ORDER — SODIUM CHLORIDE 0.9 % IV SOLN: 500.0000 mL | Freq: Once | INTRAVENOUS | Status: DC

## 2020-08-12 NOTE — Op Note (Signed)
[place] Patient Name: Eugene Castaneda Procedure Date: 08/12/2020 1:39 PM MRN: [id number] Endoscopist: Docia Chuck. Henrene Pastor , MD Age: 75 Referring MD:  Date of Birth: [birth year] Gender: Male Account #: [id number] Procedure:                Colonoscopy with cold snare polypectomy x 1 Indications:              Heme positive stool, Iron deficiency anemia. The                            patient also has a history of multiple adenomatous                            colon polyps. Previous colonoscopy elsewhere 2015                            with 3 tubular adenomas. Last colonoscopy July 2020                            with 5 polyps. Medicines:                Monitored Anesthesia Care Procedure:                Pre-Anesthesia Assessment:                           - Prior to the procedure, a History and Physical                            was performed, and patient medications and                            allergies were reviewed. The patient's tolerance of                            previous anesthesia was also reviewed. The risks                            and benefits of the procedure and the sedation                            options and risks were discussed with the patient.                            All questions were answered, and informed consent                            was obtained. Prior Anticoagulants: The patient has                            taken Eliquis (apixaban), last dose was 3 days                            prior to procedure. ASA Grade Assessment: III - A  patient with severe systemic disease. After                            reviewing the risks and benefits, the patient was                            deemed in satisfactory condition to undergo the                            procedure.                           After obtaining informed consent, the colonoscope                            was passed under direct vision. Throughout the                             procedure, the patient's blood pressure, pulse, and                            oxygen saturations were monitored continuously. The                            CF [id number] #[id number] was introduced through the anus                            and advanced to the the cecum, identified by                            appendiceal orifice and ileocecal valve. The                            ileocecal valve, appendiceal orifice, and rectum                            were photographed. The quality of the bowel                            preparation was good. The colonoscopy was performed                            without difficulty. The patient tolerated the                            procedure well. The bowel preparation used was                            SUPREP via split dose instruction. Scope In: 1:48:18 PM Scope Out: 2:02:59 PM Scope Withdrawal Time: 0 hours 10 minutes 20 seconds  Total Procedure Duration: 0 hours 14 minutes 41 seconds  Findings:                 A 3 mm polyp was found in the transverse colon. The  polyp was removed with a cold snare. Resection and                            retrieval were complete.                           Multiple diverticula were found in the left colon.                           Internal hemorrhoids were found during retroflexion.                           The exam was otherwise without abnormality on                            direct and retroflexion views. Complications:            No immediate complications. Estimated blood loss:                            None. Estimated Blood Loss:     Estimated blood loss: none. Impression:               - One 3 mm polyp in the transverse colon, removed                            with a cold snare. Resected and retrieved.                           - Diverticulosis in the left colon.                           - Internal hemorrhoids.                           - The  examination was otherwise normal on direct                            and retroflexion views. Recommendation:           - Repeat colonoscopy in 5 years for surveillance                            could be considered if patient is medically fit and                            willing.                           - Resume Eliquis (apixaban) today at prior dose.                           - Patient has a contact number available for                            emergencies. The signs and symptoms of potential  delayed complications were discussed with the                            patient. Return to normal activities tomorrow.                            Written discharge instructions were provided to the                            patient.                           - Resume previous diet.                           - Continue present medications.                           - Await pathology results.                           - EGD today. Please see report regarding findings                            and final recommendations Hawken Bielby N. Henrene Pastor, MD 08/12/2020 2:12:09 PM This report has been signed electronically.

## 2020-08-12 NOTE — Progress Notes (Signed)
Called to room to assist during endoscopic procedure.  Patient ID and intended procedure confirmed with present staff. Received instructions for my participation in the procedure from the performing physician.  

## 2020-08-12 NOTE — Op Note (Signed)
[place] Patient Name: Eugene Castaneda Procedure Date: 08/12/2020 1:39 PM MRN: [id number] Endoscopist: Docia Chuck. Henrene Pastor , MD Age: 75 Referring MD:  Date of Birth: 02/14/1945 Gender: Male Account #: [id number] Procedure:                Upper GI endoscopy with cold snare polypectomy x 1;                            with biopsies Indications:              Iron deficiency anemia, Heme positive stool Medicines:                Monitored Anesthesia Care Procedure:                Pre-Anesthesia Assessment:                           - Prior to the procedure, a History and Physical                            was performed, and patient medications and                            allergies were reviewed. The patient's tolerance of                            previous anesthesia was also reviewed. The risks                            and benefits of the procedure and the sedation                            options and risks were discussed with the patient.                            All questions were answered, and informed consent                            was obtained. Prior Anticoagulants: The patient has                            taken Eliquis (apixaban), last dose was 3 days                            prior to procedure. ASA Grade Assessment: III - A                            patient with severe systemic disease. After                            reviewing the risks and benefits, the patient was                            deemed in satisfactory condition to undergo the  procedure.                           After obtaining informed consent, the endoscope was                            passed under direct vision. Throughout the                            procedure, the patient's blood pressure, pulse, and                            oxygen saturations were monitored continuously. The                            GIF HQ190 #[id number] was introduced through the                             mouth, and advanced to the third part of duodenum.                            The upper GI endoscopy was accomplished without                            difficulty. The patient tolerated the procedure                            well. Scope In: Scope Out: Findings:                 The esophagus revealed mild distal esophagitis as                            manifested by mild erythema and edema. The                            esophagus was otherwise normal.                           The stomach revealed a small hiatal hernia. The                            gastric antrum revealed nonspecific patchy                            erythema. See images.. Biopsies were taken with a                            cold forceps for histology.                           A single 4 mm sessile polyp was found in the second                            portion of the duodenum. The polyp was removed with  a cold snare. Resection and retrieval were complete.                           The examined duodenum to the third portion was                            otherwise normal.                           The cardia and gastric fundus were normal on                            retroflexion. Complications:            No immediate complications. Estimated Blood Loss:     Estimated blood loss: none. Impression:               1. Mild esophagitis                           2. Patchy erythema of the gastric antrum status                            post biopsies                           3. Diminutive duodenal polyp status post polypectomy                           4. Otherwise unremarkable EGD. Recommendation:           1. Patient has a contact number available for                            emergencies. The signs and symptoms of potential                            delayed complications were discussed with the                            patient. Return to normal activities  tomorrow.                            Written discharge instructions were provided to the                            patient.                           2. Resume previous diet.                           3. Continue present medications. Resume Eliquis                            today  4. Await pathology results.                           5. CBC and ferritin level today. We will contact                            you with the results and any additional                            recommendations. Docia Chuck. Henrene Pastor, MD 08/12/2020 2:34:19 PM This report has been signed electronically.

## 2020-08-12 NOTE — Progress Notes (Signed)
pt tolerated well. VSS. awake and to recovery. Report given to RN. Bite block manipulated preprocedure and left insitu to recovery. No trauma noted.

## 2020-08-12 NOTE — Patient Instructions (Signed)
Handouts given for polyps, diverticulosis, hemorrhoids as well as Hiatal hernia and esophagitis.  Resume your Eliquis (apixaban) at the prior dose today.  Go to the lab prior to discharge today for CBC and ferritin.  Await pathology results.  YOU HAD AN ENDOSCOPIC PROCEDURE TODAY AT [facility]:   Refer to the procedure report that was given to you for any specific questions about what was found during the examination.  If the procedure report does not answer your questions, please call your gastroenterologist to clarify.  If you requested that your care partner not be given the details of your procedure findings, then the procedure report has been included in a sealed envelope for you to review at your convenience later.  YOU SHOULD EXPECT: Some feelings of bloating in the abdomen. Passage of more gas than usual.  Walking can help get rid of the air that was put into your GI tract during the procedure and reduce the bloating. If you had a lower endoscopy (such as a colonoscopy or flexible sigmoidoscopy) you may notice spotting of blood in your stool or on the toilet paper. If you underwent a bowel prep for your procedure, you may not have a normal bowel movement for a few days.  Please Note:  You might notice some irritation and congestion in your nose or some drainage.  This is from the oxygen used during your procedure.  There is no need for concern and it should clear up in a day or so.  SYMPTOMS TO REPORT IMMEDIATELY:  Following lower endoscopy (colonoscopy or flexible sigmoidoscopy):  Excessive amounts of blood in the stool  Significant tenderness or worsening of abdominal pains  Swelling of the abdomen that is new, acute  Fever of 100F or higher  Following upper endoscopy (EGD)  Vomiting of blood or coffee ground material  New chest pain or pain under the shoulder blades  Painful or persistently difficult swallowing  New shortness of breath  Fever of 100F or  higher  Black, tarry-looking stools  For urgent or emergent issues, a gastroenterologist can be reached at any hour by calling [phone]. Do not use MyChart messaging for urgent concerns.    DIET:  We do recommend a small meal at first, but then you may proceed to your regular diet.  Drink plenty of fluids but you should avoid alcoholic beverages for 24 hours.  ACTIVITY:  You should plan to take it easy for the rest of today and you should NOT DRIVE or use heavy machinery until tomorrow (because of the sedation medicines used during the test).    FOLLOW UP: Our staff will call the number listed on your records 48-72 hours following your procedure to check on you and address any questions or concerns that you may have regarding the information given to you following your procedure. If we do not reach you, we will leave a message.  We will attempt to reach you two times.  During this call, we will ask if you have developed any symptoms of COVID 19. If you develop any symptoms (ie: fever, flu-like symptoms, shortness of breath, cough etc.) before then, please call [phone].  If you test positive for Covid 19 in the 2 weeks post procedure, please call and report this information to [place].    If any biopsies were taken you will be contacted by phone or by letter within the next 1-3 weeks.  Please call us at [phone] if you have not heard about  the biopsies in 3 weeks.    SIGNATURES/CONFIDENTIALITY: You and/or your care partner have signed paperwork which will be entered into your electronic medical record.  These signatures attest to the fact that that the information above on your After Visit Summary has been reviewed and is understood.  Full responsibility of the confidentiality of this discharge information lies with you and/or your care-partner.

## 2020-08-12 NOTE — Progress Notes (Signed)
Vitals-Eugene Castaneda  Pt's states no medical or surgical changes since previsit or office visit.   Bag plugged into bladder.

## 2020-08-13 DIAGNOSIS — R339 Retention of urine, unspecified: Secondary | ICD-10-CM | POA: Diagnosis not present

## 2020-08-13 LAB — CBC WITH DIFFERENTIAL/PLATELET
Basophils Absolute: 0 10*3/uL (ref 0.0–0.1)
Basophils Relative: 0.5 % (ref 0.0–3.0)
Eosinophils Absolute: 0.1 10*3/uL (ref 0.0–0.7)
Eosinophils Relative: 1.2 % (ref 0.0–5.0)
HCT: 35.5 % — ABNORMAL LOW (ref 39.0–52.0)
Hemoglobin: 12 g/dL — ABNORMAL LOW (ref 13.0–17.0)
Lymphocytes Relative: 12.7 % (ref 12.0–46.0)
Lymphs Abs: 0.6 10*3/uL — ABNORMAL LOW (ref 0.7–4.0)
MCHC: 33.8 g/dL (ref 30.0–36.0)
MCV: 99.5 fl (ref 78.0–100.0)
Monocytes Absolute: 0.3 10*3/uL (ref 0.1–1.0)
Monocytes Relative: 7.4 % (ref 3.0–12.0)
Neutro Abs: 3.6 10*3/uL (ref 1.4–7.7)
Neutrophils Relative %: 78.2 % — ABNORMAL HIGH (ref 43.0–77.0)
Platelets: 181 10*3/uL (ref 150.0–400.0)
RBC: 3.57 Mil/uL — ABNORMAL LOW (ref 4.22–5.81)
RDW: 16.4 % — ABNORMAL HIGH (ref 11.5–15.5)
WBC: 4.6 10*3/uL (ref 4.0–10.5)

## 2020-08-14 ENCOUNTER — Other Ambulatory Visit: Payer: Self-pay

## 2020-08-14 ENCOUNTER — Telehealth: Payer: Self-pay | Admitting: *Deleted

## 2020-08-14 DIAGNOSIS — D649 Anemia, unspecified: Secondary | ICD-10-CM

## 2020-08-14 NOTE — Telephone Encounter (Signed)
  Follow up Call-  Call back number 08/12/2020 08/09/2018  Post procedure Call Back phone  # [phone]  Permission to leave phone message Yes Yes  Some recent data might be hidden     Patient questions:  Do you have a fever, pain , or abdominal swelling? No. Pain Score  0 *  Have you tolerated food without any problems? Yes.    Have you been able to return to your normal activities? Yes.    Do you have any questions about your discharge instructions: Diet   No. Medications  No. Follow up visit  No.  Do you have questions or concerns about your Care? No.  Actions: * If pain score is 4 or above: No action needed, pain <4.  Have you developed a fever since your procedure? no  2.   Have you had an respiratory symptoms (SOB or cough) since your procedure? no  3.   Have you tested positive for COVID 19 since your procedure no  4.   Have you had any family members/close contacts diagnosed with the COVID 19 since your procedure?  no   If yes to any of these questions please route to Joylene John, RN and Joella Prince, RN

## 2020-08-18 ENCOUNTER — Encounter: Payer: Self-pay | Admitting: Internal Medicine

## 2020-08-27 ENCOUNTER — Other Ambulatory Visit: Payer: Self-pay

## 2020-08-27 ENCOUNTER — Ambulatory Visit (INDEPENDENT_AMBULATORY_CARE_PROVIDER_SITE_OTHER): Payer: Medicare Other | Admitting: Internal Medicine

## 2020-08-27 VITALS — BP 114/64 | HR 57 | Ht 70.5 in | Wt 185.2 lb

## 2020-08-27 DIAGNOSIS — I480 Paroxysmal atrial fibrillation: Secondary | ICD-10-CM | POA: Diagnosis not present

## 2020-08-27 DIAGNOSIS — D649 Anemia, unspecified: Secondary | ICD-10-CM

## 2020-08-27 DIAGNOSIS — I10 Essential (primary) hypertension: Secondary | ICD-10-CM

## 2020-08-27 DIAGNOSIS — I2510 Atherosclerotic heart disease of native coronary artery without angina pectoris: Secondary | ICD-10-CM

## 2020-08-27 NOTE — Patient Instructions (Addendum)
Medication Instructions:  Your physician recommends that you continue on your current medications as directed. Please refer to the Current Medication list given to you today.  Labwork: None ordered.  Testing/Procedures: None ordered.  Follow-Up: Your physician wants you to follow-up in: 02/22/21 at 8 am with Dr. Rayann Heman.   Any Other Special Instructions Will Be Listed Below (If Applicable).  If you need a refill on your cardiac medications before your next appointment, please call your pharmacy.

## 2020-08-27 NOTE — Progress Notes (Signed)
PCP: [name], MD Primary Cardiologist: Dr Harrell Gave Primary EP: Dr Rayann Heman  Eugene Castaneda is a 75 y.o. male who presents today for routine electrophysiology followup.  Since last being seen in our clinic, the patient reports doing very well.  He tried to stop flecainide but had afib and restarted this medicine. Today, he denies symptoms of palpitations, chest pain, shortness of breath,  lower extremity edema, dizziness, presyncope, or syncope.  The patient is otherwise without complaint today.   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   has supra-pubic foley catheter put in 2016/sphincter removed in 2017 replaced with the catheter   [place] N/A 07/23/2019   Procedure: [place];  Surgeon: [name], MD;  Location: [place] [facility];  Service: Cardiovascular;  Laterality: N/A;   ATRIAL FIBRILLATION ABLATION N/A 02/06/2020   Procedure: ATRIAL FIBRILLATION ABLATION;  Surgeon: [name], MD;  Location: [place] [facility];  Service: Cardiovascular;  Laterality: N/A;   catheter ablationfor afib  2010   COLONOSCOPY     PROSTATECTOMY  2009    TONSILLECTOMY      ROS- all systems are reviewed and negatives except as per HPI above  Current Outpatient Medications  Medication Sig Dispense Refill   azaTHIOprine (IMURAN) 50 MG tablet Take 2 tablets by mouth twice daily 360 tablet 3   cholecalciferol (VITAMIN D3) 25 MCG (1000 UNIT) tablet Take 1,000 Units by mouth daily. 1 Tablet Daily     clotrimazole-betamethasone (LOTRISONE) cream Apply 1 application topically 2 (two) times daily as needed (irritation).     CRANBERRY PO Take 1 tablet by mouth daily. 650 mg daily     Cyanocobalamin 5000 MCG CAPS Take 1 capsule by mouth daily.     ELIQUIS 5 MG TABS tablet Take 1 tablet by mouth twice daily 180 tablet 2   ferrous sulfate 325 (65 FE) MG EC tablet Take 325 mg by mouth daily.     Fish Oil OIL Take 1,000 mg by mouth daily.      flecainide (TAMBOCOR) 50 MG tablet Take 1 tablet (50 mg total) by mouth 2 (two) times daily. 180 tablet 1   gluconic acid-citric acid (RENACIDIN) irrigation Irrigate with 30 mLs as directed every other day.     Incontinence Supplies (CATHETER EXTENSION TUBING) MISC Inject 1 fluid ounce into the vein as needed. Use as directed     Leuprolide Acetate (LUPRON IJ) Inject as directed as needed (Prostate cancer).      levothyroxine (SYNTHROID, LEVOTHROID) 150 MCG tablet Take 150 mcg by mouth daily before breakfast.     lisinopril (PRINIVIL,ZESTRIL) 20 MG tablet Take 20 mg  by mouth 2 (two) times a day.      mirabegron ER (MYRBETRIQ) 50 MG TB24 tablet Take 50 mg by mouth as needed.     Multiple Vitamin (MULTIVITAMIN) tablet Take 1 tablet by mouth daily.     nystatin ointment (MYCOSTATIN) Apply 1 application topically as needed.     omeprazole (PRILOSEC) 20 MG capsule Take 20 mg by mouth daily.      rosuvastatin (CRESTOR) 5 MG tablet Take 2.5 mg by mouth daily.     vitamin C (ASCORBIC ACID) 500 MG tablet Take 500 mg by mouth daily.     Water For Irrigation, Sterile (STERILE WATER FOR IRRIGATION) Irrigate with 1,000,000 mLs  as directed as needed. Use as directed     No current facility-administered medications for this visit.    Physical Exam: Vitals:   08/27/20 1141  BP: 114/64  Pulse: (!) 57  SpO2: 99%  Weight: 185 lb 3.2 oz (84 kg)  Height: 5' 10.5" (1.791 m)    GEN- The patient is well appearing, alert and oriented x 3 today.   Head- normocephalic, atraumatic Eyes-  Sclera clear, conjunctiva pink Ears- hearing intact Oropharynx- clear Lungs- Clear to ausculation bilaterally, normal work of breathing Heart- Regular rate and rhythm, no murmurs, rubs or gallops, PMI not laterally displaced GI- soft, NT, ND, + BS Extremities- no clubbing, cyanosis, or edema  Wt Readings from Last 3 Encounters:  08/27/20 185 lb 3.2 oz (84 kg)  08/12/20 200 lb (90.7 kg)  06/17/20 198 lb (89.8 kg)    EKG tracing ordered today is personally reviewed and shows sinus rhythm 57 bpm, PR 264 msec  Assessment and Plan:  Paroxysmal atrial fibrillation Doing well s/p ablation He did try to stop flecainide but had recurrent afib (I have confirmed by Apple Watch review today).  We should therefore continue flecainide at this time Chads2vasc score is 3.  Continue eliquis  2. HTN Stable No change required today Continue lisinopril '20mg'$  BID  3. HL Continue crestor   Risks, benefits and potential toxicities for medications prescribed and/or refilled reviewed with patient today.   Return in 6 months  [name] MD, [facility] 08/27/2020 11:59 AM

## 2020-09-09 DIAGNOSIS — E785 Hyperlipidemia, unspecified: Secondary | ICD-10-CM | POA: Diagnosis not present

## 2020-09-09 DIAGNOSIS — Z125 Encounter for screening for malignant neoplasm of prostate: Secondary | ICD-10-CM | POA: Diagnosis not present

## 2020-09-09 DIAGNOSIS — E039 Hypothyroidism, unspecified: Secondary | ICD-10-CM | POA: Diagnosis not present

## 2020-09-09 DIAGNOSIS — R7301 Impaired fasting glucose: Secondary | ICD-10-CM | POA: Diagnosis not present

## 2020-09-10 DIAGNOSIS — Z4803 Encounter for change or removal of drains: Secondary | ICD-10-CM | POA: Diagnosis not present

## 2020-09-16 DIAGNOSIS — G7000 Myasthenia gravis without (acute) exacerbation: Secondary | ICD-10-CM | POA: Diagnosis not present

## 2020-09-16 DIAGNOSIS — Z0000 Encounter for general adult medical examination without abnormal findings: Secondary | ICD-10-CM | POA: Diagnosis not present

## 2020-09-16 DIAGNOSIS — I10 Essential (primary) hypertension: Secondary | ICD-10-CM | POA: Diagnosis not present

## 2020-09-16 DIAGNOSIS — E039 Hypothyroidism, unspecified: Secondary | ICD-10-CM | POA: Diagnosis not present

## 2020-09-16 DIAGNOSIS — C61 Malignant neoplasm of prostate: Secondary | ICD-10-CM | POA: Diagnosis not present

## 2020-09-16 DIAGNOSIS — Z9359 Other cystostomy status: Secondary | ICD-10-CM | POA: Diagnosis not present

## 2020-09-16 DIAGNOSIS — D649 Anemia, unspecified: Secondary | ICD-10-CM | POA: Diagnosis not present

## 2020-09-16 DIAGNOSIS — R82998 Other abnormal findings in urine: Secondary | ICD-10-CM | POA: Diagnosis not present

## 2020-09-16 DIAGNOSIS — M1990 Unspecified osteoarthritis, unspecified site: Secondary | ICD-10-CM | POA: Diagnosis not present

## 2020-09-16 DIAGNOSIS — E785 Hyperlipidemia, unspecified: Secondary | ICD-10-CM | POA: Diagnosis not present

## 2020-09-16 DIAGNOSIS — R5383 Other fatigue: Secondary | ICD-10-CM | POA: Diagnosis not present

## 2020-09-16 DIAGNOSIS — I480 Paroxysmal atrial fibrillation: Secondary | ICD-10-CM | POA: Diagnosis not present

## 2020-09-16 DIAGNOSIS — R7301 Impaired fasting glucose: Secondary | ICD-10-CM | POA: Diagnosis not present

## 2020-09-23 ENCOUNTER — Other Ambulatory Visit: Payer: Self-pay | Admitting: Cardiology

## 2020-09-23 DIAGNOSIS — I480 Paroxysmal atrial fibrillation: Secondary | ICD-10-CM

## 2020-09-24 NOTE — Telephone Encounter (Signed)
Prescription refill request for Eliquis received. Indication:afib Last office visit:[facility] 08/27/20 Scr:1.07 01/20/20 Age: 75mWeight:84kg

## 2020-10-06 DIAGNOSIS — C61 Malignant neoplasm of prostate: Secondary | ICD-10-CM | POA: Diagnosis not present

## 2020-10-08 DIAGNOSIS — N320 Bladder-neck obstruction: Secondary | ICD-10-CM | POA: Diagnosis not present

## 2020-10-08 DIAGNOSIS — N35013 Post-traumatic anterior urethral stricture: Secondary | ICD-10-CM | POA: Diagnosis not present

## 2020-10-08 DIAGNOSIS — C61 Malignant neoplasm of prostate: Secondary | ICD-10-CM | POA: Diagnosis not present

## 2020-10-08 DIAGNOSIS — Z4803 Encounter for change or removal of drains: Secondary | ICD-10-CM | POA: Diagnosis not present

## 2020-10-08 DIAGNOSIS — N528 Other male erectile dysfunction: Secondary | ICD-10-CM | POA: Diagnosis not present

## 2020-10-14 ENCOUNTER — Ambulatory Visit (INDEPENDENT_AMBULATORY_CARE_PROVIDER_SITE_OTHER): Payer: Medicare Other

## 2020-10-14 ENCOUNTER — Ambulatory Visit (INDEPENDENT_AMBULATORY_CARE_PROVIDER_SITE_OTHER): Payer: Medicare Other | Admitting: Orthopaedic Surgery

## 2020-10-14 ENCOUNTER — Other Ambulatory Visit: Payer: Self-pay

## 2020-10-14 ENCOUNTER — Encounter: Payer: Self-pay | Admitting: Orthopaedic Surgery

## 2020-10-14 DIAGNOSIS — M25551 Pain in right hip: Secondary | ICD-10-CM | POA: Diagnosis not present

## 2020-10-14 DIAGNOSIS — I2510 Atherosclerotic heart disease of native coronary artery without angina pectoris: Secondary | ICD-10-CM

## 2020-10-14 DIAGNOSIS — M545 Low back pain, unspecified: Secondary | ICD-10-CM | POA: Diagnosis not present

## 2020-10-14 NOTE — Progress Notes (Signed)
Office Visit Note   Patient: Eugene Castaneda           Date of Birth: 27-Sep-1945           MRN: [id number] Visit Date: 10/14/2020              Requested by: [name], MD [address] [place],  [place] [address] PCP: [name], MD   Assessment & Plan: Visit Diagnoses:  1. Pain in right hip   2. 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  I would like to send him to physical therapy so they can work on any modalities to calm down the low back pain with him.  This is per therapist discretion.  I will see him back in a month to see how he is doing overall.  He is on Eliquis so he cannot take anti-inflammatories.  It is safe for him to try Voltaren gel as well and I instructed him about that.  Follow-Up Instructions: Return in about 4 weeks (around 11/11/2020).   Orders:  Orders Placed This Encounter  Procedures   XR HIP UNILAT W OR W/O PELVIS 2-3 VIEWS RIGHT   XR Lumbar Spine 2-3 Views   No orders of the defined types were placed in this encounter.     Procedures: No procedures performed   Clinical Data: No additional findings.   Subjective: Chief Complaint  Patient presents with   Right Hip - Pain  The patient is a 75 year old gentleman sent from Dr. Dagmar Hait of [place] to evaluate and treat what was described as hip pain but is really low back pain.  He denies any groin pain or any lateral hip pain.  He points to the lower aspect of his lumbar spine and across his back as a source of his pain and into the sciatic region on the right side.  He sits on a couch at night that is very fluffy and he gets up from that She has a lot of pain in his lower back.  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  He denies any  change in bowel bladder function.  He does have a history of chronic prostate cancer and was concerned could this be something going on with his back in terms of metastasis which is certainly common from prostate cancer.  He denies any numbness and tingling down his legs or weakness in his legs.  Again there is no groin pain at all.  HPI  Review of Systems He currently denies any headache, chest pain, shortness of breath, fever, chills, nausea, vomiting  Objective: Vital Signs: There were no vitals taken for this visit.  Physical Exam He is alert and orient x3 and in no acute distress Ortho Exam Examination of his right hip is entirely normal.  He is full and fluid range of motion of the right hip with no pain.  When I have him lay in a decubitus position there is no pain to palpation of the trochanteric area of the IT band.  He has low back pain to the right side of the facet joint areas to the left and the right it radiates into the sciatic region.  He has a negative straight leg raise  bilaterally and excellent strength in both his lower extremities. Specialty Comments:  No specialty comments available.  Imaging: XR HIP UNILAT W OR W/O PELVIS 2-3 VIEWS RIGHT  Result Date: 10/14/2020 An AP pelvis and lateral of the right hip shows a normal-appearing right and left hip.  The joint space is well-maintained.  There is no evidence of cortical irregularities around the pelvis or either hip.  XR Lumbar Spine 2-3 Views  Result Date: 10/14/2020 2 views lumbar spine show arthritic changes in the posterior elements between L4 and L5 as well as L5 and S1.  The alignment is well-maintained in the AP and lateral planes.    PMFS History: Patient Active Problem List   Diagnosis Date Noted   Coronary artery calcification seen on CT scan 06/17/2020   Essential hypertension 06/17/2020   Mixed hyperlipidemia 06/17/2020   Preop cardiovascular exam 06/17/2020   Paroxysmal atrial fibrillation ([place])  11/13/2017   Hypertensive heart disease without CHF 11/13/2017   Chronic kidney disease (CKD), stage III (moderate) ([place]) 11/13/2017   Hypothyroidism 11/13/2017   Prostate cancer ([place]) 11/13/2017   CAD (coronary artery disease), native coronary artery 11/13/2017   Myasthenia gravis ([place]) 01/07/2015   Past Medical History:  Diagnosis Date   CAD (coronary artery disease), native coronary artery 11/13/2017   Coronary calcification noted on CT scan    Chronic kidney disease (CKD), stage III (moderate) ([place]) 11/13/2017   Diplopia    Foley catheter present    pt has supra-pubic foley catheter in place/since 2016   GERD (gastroesophageal reflux disease)    Hyperlipemia    Hypertension    Hypertensive heart disease without CHF 11/13/2017   Hypothyroidism    Impaired fasting glucose    MG (myasthenia gravis) (HCC)    Paroxysmal atrial fibrillation ([place]) 11/13/2017   RF ablation Dr. 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   has supra-pubic foley catheter put in 2016/sphincter removed in 2017 replaced with the catheter   [place] N/A 07/23/2019   Procedure: ATRIAL FIBRILLATION ABLATION;  Surgeon: [name], MD;  Location: [facility];  Service: Cardiovascular;  Laterality: N/A;   ATRIAL FIBRILLATION ABLATION N/A 02/06/2020   Procedure: ATRIAL FIBRILLATION ABLATION;  Surgeon: [name], MD;  Location: [place];  Service: Cardiovascular;  Laterality: N/A;   catheter ablationfor afib  2010   COLONOSCOPY     PROSTATECTOMY  2009   TONSILLECTOMY     Social History    Occupational History   Occupation: Retired  Tobacco Use   Smoking status: Former    Types: Cigarettes    Quit date: 02/01/1980    Years since quitting: 40.7   Smokeless tobacco: Never  Vaping Use   Vaping Use: Never used  Substance and Sexual Activity   Alcohol use: Yes    Alcohol/week: 0.0 standard drinks    Comment: 2 glasses red wine daily   Drug use: No   Sexual activity: Not on file

## 2020-10-14 NOTE — Addendum Note (Signed)
Addended by: Robyne Peers on: 10/14/2020 03:37 PM   Modules accepted: Orders

## 2020-10-21 ENCOUNTER — Encounter: Payer: Self-pay | Admitting: Physical Therapy

## 2020-10-21 ENCOUNTER — Other Ambulatory Visit: Payer: Self-pay

## 2020-10-21 ENCOUNTER — Ambulatory Visit: Payer: Medicare Other | Attending: Orthopaedic Surgery | Admitting: Physical Therapy

## 2020-10-21 DIAGNOSIS — M545 Low back pain, unspecified: Secondary | ICD-10-CM | POA: Diagnosis not present

## 2020-10-21 DIAGNOSIS — M62830 Muscle spasm of back: Secondary | ICD-10-CM | POA: Insufficient documentation

## 2020-10-21 DIAGNOSIS — M6281 Muscle weakness (generalized): Secondary | ICD-10-CM | POA: Diagnosis not present

## 2020-10-21 NOTE — Therapy (Signed)
[facility] [address], [place], [place], [address] Phone: [phone]   Fax:  [phone]  Physical Therapy Evaluation  Patient Details  Name: Eugene Castaneda MRN: [id number] Date of Birth: 08/05/1945 Referring Provider (PT): Dr. Jean Rosenthal   Encounter Date: 10/21/2020   PT End of Session - 10/21/20 1503     Visit Number 1    Date for PT Re-Evaluation 01/13/21    Authorization Type Medicare    Authorization - Visit Number 1    Authorization - Number of Visits 10    PT Start Time 5732    PT Stop Time 1525    PT Time Calculation (min) 40 min    Activity Tolerance Patient tolerated treatment well;No increased pain    Behavior During Therapy [facility] for tasks assessed/performed             Past Medical History:  Diagnosis Date   CAD (coronary artery disease), native coronary artery 11/13/2017   Coronary calcification noted on CT scan    Chronic kidney disease (CKD), stage III (moderate) ([place]) 11/13/2017   Diplopia    Foley catheter present    pt has supra-pubic foley catheter in place/since 2016   GERD (gastroesophageal reflux disease)    Hyperlipemia    Hypertension    Hypertensive heart disease without CHF 11/13/2017   Hypothyroidism    Impaired fasting glucose    MG (myasthenia gravis) ([place])    Paroxysmal atrial fibrillation ([place]) 11/13/2017   RF ablation Dr. [name] 2010 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    Subjective Assessment - 10/21/20 1451     Subjective Patient reports 2.5 months ago his pain started when sitting on a soft sofa. When he stood up from a soft chair has increased pain. No pain laying down or standing.    Patient Stated Goals manage pain    Currently in Pain? Yes    Pain Score 4     Pain Location Back    Pain Orientation Right    Pain Descriptors / Indicators Sharp;Other (Comment)   deep   Pain Type Acute pain    Pain Onset More than a month ago    Pain Frequency Intermittent    Aggravating Factors  sitting on soft chair, bending forward, swim and turns at the end of the lap    Pain Relieving Factors sit on a hard surface, heat    Multiple Pain Sites No                [facility] PT Assessment - 10/21/20 0001       Assessment   Medical  Diagnosis M54.50 Low back pain, unspecified back pain, laterality, unspecified chronisity, unspecified whether sciatica present    Referring Provider (PT) Dr. Jean Rosenthal    Onset Date/Surgical Date 08/20/20    Prior Therapy none      Precautions   Precautions Other (comment)    Precaution Comments prostate cancer      Restrictions   Weight Bearing Restrictions No      Balance Screen   Has the patient fallen in the past 6 months No    Has the patient had a decrease in activity level because of a fear of falling?  No    Is the patient reluctant to leave their home because of a fear of falling?  No      Home Ecologist residence      Prior Function   Level of Independence Independent    Leisure swimming      Cognition   Overall Cognitive Status Within  Functional Limits for tasks assessed      Observation/Other Assessments   Focus on Therapeutic Outcomes (FOTO)  initial FOTO is 59 and predicted is 75      ROM / Strength   AROM / PROM / Strength AROM;PROM;Strength      AROM   Right Hip Extension 0    Left Hip Extension 0    Lumbar Flexion decreased by 25%    Lumbar Extension decreased by 50%    Lumbar - Right Side Bend decreased by 50%    Lumbar - Left Side Bend decreased by 50%    Lumbar - Right Rotation decreased by 50%    Lumbar - Left Rotation decreased by 50%      Strength   Right Hip ABduction 4/5    Left Hip ABduction 4/5      Palpation   Spinal mobility T10-L5 decreased mobility                        Objective measurements completed on examination: See above findings.                PT Education - 10/21/20 1526     Education Details Access Code: [email or web address]    Person(s) Educated Patient    Methods Explanation;Demonstration;Verbal cues;Handout    Comprehension Returned demonstration;Verbalized understanding              PT Short Term Goals - 10/21/20 1625       PT SHORT TERM GOAL #1   Title Patient is indepedent with initial HEP for flexibility    Time 4    Period Weeks    Status New    Target Date 11/18/20      PT SHORT TERM GOAL #2   Title Patient understands ways to manage his back pain with correct body mechanics with home tasks, lifting, and turning in bed    Time 4    Period Weeks    Status New    Target Date 11/18/20               PT Long Term Goals - 10/21/20 1627       PT LONG TERM GOAL #1   Title Patient independent with advanced HEP for core stabilization and aquatic program he can do in the pool    Time 8    Period Weeks    Status New    Target Date 12/16/20      PT LONG TERM GOAL #  2   Title Patient is able to sit in a soft chair with minimal pain due to improved lumbar flexibilty    Time 8    Period Weeks    Status New    Target Date  12/16/20      PT LONG TERM GOAL #3   Title Patient is able to bend forward with minimal pain due to improved trunk flexion and mobility of the lumbar tissue    Time 8    Period Weeks    Status New    Target Date 12/16/20      PT LONG TERM GOAL #4   Title FOTO socre >/= 54 due to less pain, increased mobility, and correct body mechanics    Time 8    Period Weeks    Status New    Target Date 12/16/20                    Plan - 10/21/20 1504     Clinical Impression Statement Patient is a 75 year old male with low back pain on the right at level 4/10 intermittently. His pain is worse with sitting on a soft chair, getting up from a soft chair, and bending forward. Lumbar ROM is limited by 50%. Bilateral hip extension decreased to 0 degrees actively. He moves slowly when turning or changing position on the mat. Patient keeps his legs straight wiht a float between knees due to his catheter. FOTO score is 54. He has weakness in hip abductors and extensors. Patient will benefit from skilled therapy to reduce pain, understand how to manage it and improve mobility.    Personal Factors and Comorbidities Comorbidity 3+;Fitness    Comorbidities Prostate cancer; Myasthenia Gravis; CAD; CKD; Hypothyroidism; Hypertensive heart disease    Examination-Activity Limitations Sit;Bend    Stability/Clinical Decision Making Evolving/Moderate complexity    Clinical Decision Making Low    Rehab Potential Excellent    PT Frequency 2x / week    PT Duration 8 weeks    PT Treatment/Interventions Aquatic Therapy;Neuromuscular re-education;Therapeutic exercise;Therapeutic activities;Patient/family education;Manual techniques;Passive range of motion;Dry needling;Spinal Manipulations;Joint Manipulations    PT Next Visit Plan aquatic educated him on a home program he can do for increasing back and hip mobility and trunk mobility; Mobilization to hips and lumbar spine; hip stretches, back stretches; management of  back pain. Has a catheter on the right side of his leg.    PT Home Exercise Plan Access Code: [email or web address]    Consulted and Agree with Plan of Care Patient             Patient will benefit from skilled therapeutic intervention in order to improve the following deficits and impairments:  Decreased range of motion, Increased fascial restricitons, Pain, Decreased activity tolerance, Decreased strength, Decreased mobility  Visit Diagnosis: Acute right-sided low back pain without sciatica - Plan: PT plan of care cert/re-cert  Muscle weakness (generalized) - Plan: PT plan of care cert/re-cert  Muscle spasm of back - Plan: PT plan of care cert/re-cert     Problem List Patient Active Problem List   Diagnosis Date Noted   Coronary artery calcification seen on CT scan 06/17/2020   Essential hypertension 06/17/2020   Mixed hyperlipidemia 06/17/2020   Preop cardiovascular exam 06/17/2020   Paroxysmal atrial fibrillation ([place]) 11/13/2017   Hypertensive heart disease without CHF 11/13/2017   Chronic kidney disease (CKD), stage III (moderate) ([place]) 11/13/2017   Hypothyroidism 11/13/2017   Prostate cancer ([place]) 11/13/2017   CAD (coronary  artery disease), native coronary artery 11/13/2017   Myasthenia gravis ([place]) 01/07/2015    Earlie Counts, PT 10/21/20 4:33 PM  [facility] [address] [place], [place], [address] Phone: [phone]   Fax:  [phone]  Name: Eugene Castaneda MRN: [id number] Date of Birth: 1945/08/24

## 2020-10-21 NOTE — Patient Instructions (Signed)
Access Code: [email or web address] URL: [email or web address] Date: 10/21/2020 Prepared by: Earlie Counts  Exercises Supine Hamstring Stretch with Strap - 1 x daily - 7 x weekly - 1 sets - 2 reps - 30 sec hold Supine Piriformis Stretch with Leg Straight - 1 x daily - 7 x weekly - 1 sets - 2 reps - 30 sec hold Hooklying Active Hamstring Stretch - 1 x daily - 7 x weekly - 1 sets - 2 reps - 30 sec hold Standing Hip Flexor Stretch - 1 x daily - 7 x weekly - 1 sets - 2 reps - 30 sec hold [facility] [address] [place], [place] [address] Phone # [phone] Fax [phone]

## 2020-10-22 ENCOUNTER — Ambulatory Visit (INDEPENDENT_AMBULATORY_CARE_PROVIDER_SITE_OTHER): Payer: Medicare Other | Admitting: Neurology

## 2020-10-22 ENCOUNTER — Encounter: Payer: Self-pay | Admitting: Neurology

## 2020-10-22 VITALS — BP 147/72 | HR 49 | Ht 71.0 in | Wt 179.0 lb

## 2020-10-22 DIAGNOSIS — I2510 Atherosclerotic heart disease of native coronary artery without angina pectoris: Secondary | ICD-10-CM

## 2020-10-22 DIAGNOSIS — G7000 Myasthenia gravis without (acute) exacerbation: Secondary | ICD-10-CM | POA: Diagnosis not present

## 2020-10-22 MED ORDER — AZATHIOPRINE 50 MG PO TABS: ORAL_TABLET | ORAL | 4 refills | Status: DC

## 2020-10-22 NOTE — Progress Notes (Signed)
Chief Complaint  Patient presents with   Follow-up    Rm 16, alone, reports no changes, no new concerns       ASSESSMENT AND PLAN  Eugene Castaneda is a 75 y.o. male   Serum positive generalized myasthenia gravis  Very well controlled with current dose of Imuran 50 mg 2 tablets twice a day for many years,  We will decrease Imuran to 50/100 mg  Follow-up in 1 year, if he continues to do well, we will continue to decrease Imuran to 50/50 mg  Laboratory evaluation by his primary care physician  DIAGNOSTIC DATA (LABS, IMAGING, TESTING) - I reviewed patient records, labs, notes, testing and imaging myself where available.   MEDICAL HISTORY:  Eugene Castaneda is here to follow-up for serum positive generalized myasthenia gravis. 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 Dagmar Hait in May 2016 for evaluation of double vision, he also has intermittent left ptosis, at its worst, he also has mild bulbar, neck flexion weakness, mild chewing difficulty, mild proximal upper extremity weakness. He never had significant swallowing, breathing difficulty, no gait difficulty   CT chest showed no evidence of thymoma May 2016 MRI of the brain in May 2016 showed mild atrophy His symptoms responded very well to Mestinon 60 mg 3 times a day, no significant side effect Prednisone was started since May 2016, up to 40 mg daily, he responded well, was able to tolerate tapering dose of prednisone  Imuran was started in May 21st 2016, 50 mg 2 tablets twice a day   He has urinary incontinence, planning on to have urethral reconstruction surgery by  [facility] urologist Dr.Terlecki   He had urethroplasty in 10/2013. 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, receiving Cardiac monitoring reported bradycardia episode, 25/m during sleep   UPDATE April 07 2016:  I reviewed primary care note from Dr.[name], he was found to have mild abnormal liver functional tests, highly suspected for recent initiation of atrovastatin, also complicated by Imuran use, wine intake, will have repeat laboratory evaluation on September twelfth 2017, if still elevated, he will hold atrovastatin for 4 weeks, and recheck, A1c was 5.6,    Repeat laboratory evaluation in January 2018 showed AST 62, ALT 49, LDL 76, cholesterol 139, he is on lower dose of atorvastatin now,   He is now taking Imuran 50 mg 2 tablets twice a day, at extreme gaze he has mild double vision, no problem reading, he has several episodes he has mild swallowing trouble, he feels like that the steak stuck behind his sternum, he has mild hoarseness,  He was treated with Tamiflu in Jan 2018 as preventive medication.   He swim one hour without difficulty.     UPDATE Sept 10 2018: He was seen by Dr. Radene Gunning on July 01 2016, lab evaluations showed CMP, glucose 110, creatinine 0.8, mild elevated AST 59, ALT 44, WBC was 3.79, hemoglobin of 14.2, normal thyroid functional tasks, LDL was 70, A1c 5.8.   He is taking Imuran 50 mg twice a  day, doing very well, no significant side effect noticed, continue has mild double vision on extreme gaze, able to swim regularly   UPDATE Sept 11 2019: He is overall doing very well, there is no recurrent muscle weakness, when he drives, looking to extreme left or right, he had transient double vision, he denies swallowing difficulty, no limb muscle weakness, able to swim 1 hour without stopping.   Laboratory evaluations in May 2019, A1c was 5.7, normal CBC, creatinine of 0.9, hemoglobin of 14.5, normal TSH, 0.93, elevated PSA 6.98  Update October 22, 2020: He is overall doing very well, no recurrent double vision, droopy eyelid, or limb muscle weakness, taking Imuran 50 mg 2 tablets twice a day, does not take Mestinon, able to swim freestyle 1 hour without difficulty,   PHYSICAL EXAM:   Vitals:   10/22/20 1018  BP: (!) 147/72  Pulse: (!) 49  Weight: 179 lb (81.2 kg)  Height: 5\' 11"  (1.803 m)   Not recorded     Body mass index is 24.97 kg/m.  PHYSICAL EXAMNIATION:  Gen: NAD, conversant, well nourised, well groomed        NEUROLOGICAL EXAM:  MENTAL STATUS: Speech/cognition: Awake, alert, oriented to history taking and casual conversation   CRANIAL NERVES: CN II: Visual fields are full to confrontation. Pupils are round equal and briskly reactive to light. CN III, IV, VI: extraocular movement are normal. No ptosis. CN V: Facial sensation is intact to light touch CN VII: Face is symmetric with normal eye closure  CN VIII: Hearing is normal to causal conversation. CN IX, X: Phonation is normal. CN XI: Head turning and shoulder shrug are intact  MOTOR: Normal muscle tone, bulk and strength  REFLEXES: Reflexes are 1 and symmetric at the biceps, triceps, knees, and ankles.    SENSORY: Intact to light touch, pinprick and vibratory sensation are intact in fingers and toes.  COORDINATION: There is no trunk or limb dysmetria noted.  GAIT/STANCE: Normal  REVIEW OF  SYSTEMS:  Full 14 system review of systems performed and notable only for as above All other review of systems were negative.   ALLERGIES: No Known Allergies  HOME MEDICATIONS: Current Outpatient Medications  Medication Sig Dispense Refill   apixaban (ELIQUIS) 5 MG TABS tablet Take 1 tablet by mouth twice daily 180 tablet 1   azaTHIOprine (IMURAN) 50 MG tablet Take 2 tablets by mouth twice daily 360 tablet 3   cholecalciferol (VITAMIN D3) 25 MCG (1000 UNIT) tablet Take 1,000 Units by mouth daily. 1 Tablet Daily     clotrimazole-betamethasone (LOTRISONE) cream Apply 1 application topically 2 (two) times daily as needed (irritation).     CRANBERRY PO Take 1 tablet by mouth daily. 650 mg daily     Cyanocobalamin 5000 MCG CAPS Take 1 capsule by mouth daily.     ferrous sulfate 325 (65 FE) MG EC tablet Take 325 mg by mouth daily.     Fish Oil OIL Take 1,000 mg by mouth daily.      flecainide (TAMBOCOR) 50 MG tablet  Take 1 tablet (50 mg total) by mouth 2 (two) times daily. 180 tablet 1   gluconic acid-citric acid (RENACIDIN) irrigation Irrigate with 30 mLs as directed every other day.     Incontinence Supplies (CATHETER EXTENSION TUBING) MISC Inject 1 fluid ounce into the vein as needed. Use as directed     Leuprolide Acetate (LUPRON IJ) Inject as directed as needed (Prostate cancer).      levothyroxine (SYNTHROID, LEVOTHROID) 150 MCG tablet Take 150 mcg by mouth daily before breakfast.     lisinopril (PRINIVIL,ZESTRIL) 20 MG tablet Take 20 mg by mouth 2 (two) times a day.      mirabegron ER (MYRBETRIQ) 50 MG TB24 tablet Take 50 mg by mouth as needed.     Multiple Vitamin (MULTIVITAMIN) tablet Take 1 tablet by mouth daily.     nystatin ointment (MYCOSTATIN) Apply 1 application topically as needed.     omeprazole (PRILOSEC) 20 MG capsule Take 20 mg by mouth daily.      rosuvastatin (CRESTOR) 5 MG tablet Take 2.5 mg by mouth daily.     vitamin C (ASCORBIC ACID) 500 MG tablet Take 500 mg by  mouth daily.     Water For Irrigation, Sterile (STERILE WATER FOR IRRIGATION) Irrigate with 1,000,000 mLs as directed as needed. Use as directed     No current facility-administered medications for this visit.    PAST MEDICAL HISTORY: Past Medical History:  Diagnosis Date   CAD (coronary artery disease), native coronary artery 11/13/2017   Coronary calcification noted on CT scan    Chronic kidney disease (CKD), stage III (moderate) ([place]) 11/13/2017   Diplopia    Foley catheter present    pt has supra-pubic foley catheter in place/since 2016   GERD (gastroesophageal reflux disease)    Hyperlipemia    Hypertension    Hypertensive heart disease without CHF 11/13/2017   Hypothyroidism    Impaired fasting glucose    MG (myasthenia gravis) ([place])    Paroxysmal atrial fibrillation ([place]) 11/13/2017   RF ablation Dr. [name] 2010 [facility] CHADSVASC 3   Prostate cancer [facility]) 2009   Urethral obstruction    Urinary retention    Suprapubic Catheter placed 04/25/15    PAST SURGICAL HISTORY: Past Surgical History:  Procedure Laterality Date   Artificial Urinary Sphincter  09/2014   has supra-pubic foley catheter put in 2016/sphincter removed in 2017 replaced with the catheter   [place] N/A 07/23/2019   Procedure: [place];  Surgeon: [name], MD;  Location: [place] [facility];  Service: Cardiovascular;  Laterality: N/A;   ATRIAL FIBRILLATION ABLATION N/A 02/06/2020   Procedure: ATRIAL FIBRILLATION ABLATION;  Surgeon: [name], MD;  Location: [place] [facility];  Service: Cardiovascular;  Laterality: N/A;   catheter ablationfor afib  2010   COLONOSCOPY     PROSTATECTOMY  2009   TONSILLECTOMY      FAMILY HISTORY: Family History  Problem Relation Age of Onset   Breast cancer Mother    Parkinson's disease Father    Colon cancer Neg Hx    Esophageal cancer Neg Hx    Rectal cancer Neg Hx     SOCIAL HISTORY: Social History    Socioeconomic History   Marital status: Married    Spouse name: Not on file   Number of children: 2   Years of education: Masters   Highest education level: Not on file  Occupational History   Occupation: Retired  Tobacco Use   Smoking status:  Former    Types: Cigarettes    Quit date: 02/01/1980    Years since quitting: 40.7   Smokeless tobacco: Never  Vaping Use   Vaping Use: Never used  Substance and Sexual Activity   Alcohol use: Yes    Alcohol/week: 0.0 standard drinks    Comment: 2 glasses red wine daily   Drug use: No   Sexual activity: Not on file  Other Topics Concern   Not on file  Social History Narrative   Lives in [place] with spouse.   Right-handed.   2 cups caffeine daily.   Retired from Database administrator   Social Determinants of Radio broadcast assistant Strain: Not on file  Food Insecurity: Not on file  Transportation Needs: Not on file  Physical Activity: Not on file  Stress: Not on file  Social Connections: Not on file  Intimate Partner Violence: Not on file      Marcial Pacas, M.D. Ph.D.  [facility] [address] [place], [place] [address] Ph: [phone] Fax: [phone]  CC:  [name], MD [address] [place],  [place] [address]  [name], MD

## 2020-10-26 ENCOUNTER — Other Ambulatory Visit: Payer: Self-pay

## 2020-10-26 ENCOUNTER — Ambulatory Visit: Payer: Medicare Other

## 2020-10-26 DIAGNOSIS — M545 Low back pain, unspecified: Secondary | ICD-10-CM | POA: Diagnosis not present

## 2020-10-26 DIAGNOSIS — M6281 Muscle weakness (generalized): Secondary | ICD-10-CM

## 2020-10-26 DIAGNOSIS — M62830 Muscle spasm of back: Secondary | ICD-10-CM | POA: Diagnosis not present

## 2020-10-26 NOTE — Therapy (Signed)
[facility] [address], [place], [place], [address] Phone: [phone]   Fax:  [phone]  Physical Therapy Treatment  Patient Details  Name: Eugene Castaneda MRN: [id number] Date of Birth: October 02, 1945 Referring Provider (PT): Dr. Jean Rosenthal   Encounter Date: 10/26/2020   PT End of Session - 10/26/20 1601     Visit Number 2    Date for PT Re-Evaluation 01/13/21    Authorization Type Medicare    Authorization - Number of Visits 10    PT Start Time 1520    PT Stop Time 1600    PT Time Calculation (min) 40 min    Activity Tolerance Patient tolerated treatment well;No increased pain    Behavior During Therapy [facility] for tasks assessed/performed             Past Medical History:  Diagnosis Date   CAD (coronary artery disease), native coronary artery 11/13/2017   Coronary calcification noted on CT scan    Chronic kidney disease (CKD), stage III (moderate) ([place]) 11/13/2017   Diplopia    Foley catheter present    pt has supra-pubic foley catheter in place/since 2016   GERD (gastroesophageal reflux disease)    Hyperlipemia    Hypertension    Hypertensive heart disease without CHF 11/13/2017   Hypothyroidism    Impaired fasting glucose    MG (myasthenia gravis) ([place])    Paroxysmal atrial fibrillation ([place]) 11/13/2017   RF ablation Dr. [name] 2010 [facility] CHADSVASC 3   Prostate cancer [facility]) 2009   Urethral obstruction    Urinary retention    Suprapubic Catheter placed 04/25/15    Past Surgical History:  Procedure Laterality Date   Artificial Urinary Sphincter  09/2014   has supra-pubic foley catheter put in 2016/sphincter removed in 2017 replaced with the catheter   [place] N/A 07/23/2019   Procedure: [place];  Surgeon: [name], MD;  Location: [place] [facility];  Service: Cardiovascular;  Laterality: N/A;   ATRIAL FIBRILLATION ABLATION N/A  02/06/2020   Procedure: ATRIAL FIBRILLATION ABLATION;  Surgeon: [name], MD;  Location: [place] [facility];  Service: Cardiovascular;  Laterality: N/A;   catheter ablationfor afib  2010   COLONOSCOPY     PROSTATECTOMY  2009   TONSILLECTOMY      There were no vitals filed for this visit.   Subjective Assessment - 10/26/20 1514     Subjective I'm feeling pretty good.  I haven't been having pain at all.    Currently in Pain? No/denies                               [facility] Adult PT Treatment/Exercise - 10/26/20 0001       Exercises   Exercises Knee/Hip;Lumbar      Lumbar Exercises: Stretches   Active Hamstring Stretch Left;Right;3 reps;30 seconds    Active Hamstring Stretch Limitations supine with strap and seated    Lower Trunk Rotation 3 reps;20 seconds    Piriformis Stretch 3 reps;30 seconds    Figure 4 Stretch 3 reps;20 seconds    Figure 4 Stretch Limitations seated      Lumbar Exercises: Supine   Ab Set 20 reps    AB Set Limitations with ball squeeze    Clam 20 reps    Clam Limitations yellow loop      Lumbar Exercises: Sidelying   Clam 20 reps  Clam Limitations core bracing                     PT Education - 10/26/20 1545     Education Details Access Code: [email or web address]    Person(s) Educated Patient    Methods Explanation;Demonstration;Handout    Comprehension Verbalized understanding;Returned demonstration              PT Short Term Goals - 10/21/20 1625       PT SHORT TERM GOAL #1   Title Patient is indepedent with initial HEP for flexibility    Time 4    Period Weeks    Status New    Target Date 11/18/20      PT SHORT TERM GOAL #2   Title Patient understands ways to manage his back pain with correct body mechanics with home tasks, lifting, and turning in bed    Time 4    Period Weeks    Status New    Target Date 11/18/20               PT Long Term Goals - 10/21/20 1627       PT LONG TERM GOAL #1    Title Patient independent with advanced HEP for core stabilization and aquatic program he can do in the pool    Time 8    Period Weeks    Status New    Target Date 12/16/20      PT LONG TERM GOAL #2   Title Patient is able to sit in a soft chair with minimal pain due to improved lumbar flexibilty    Time 8    Period Weeks    Status New    Target Date 12/16/20      PT LONG TERM GOAL #3   Title Patient is able to bend forward with minimal pain due to improved trunk flexion and mobility of the lumbar tissue    Time 8    Period Weeks    Status New    Target Date 12/16/20      PT LONG TERM GOAL #4   Title FOTO socre >/= 54 due to less pain, increased mobility, and correct body mechanics    Time 8    Period Weeks    Status New    Target Date 12/16/20                   Plan - 10/26/20 1547     Clinical Impression Statement 1st time follow-up after evaluation. Pt has been doing flexibility exercises since evaluation and denies any LBP since he was here.  Session focused on review of HEP and addition of new HEP for lumbopelvic flexibility and strength.  Pt required tactile and demo cues for correct alignment with stretches. Frequent verbal cues for abdominal contraction with exercise.  Pt with significant hamstring length limitations bilaterally.  Pt will have aquatic session next.  Pt [facility] the [facility].  Pt will continue to benefit from skilled PT to address LBP, tissue mobility and lumbopelvic strength.    PT Frequency 2x / week    PT Duration 8 weeks    PT Treatment/Interventions Aquatic Therapy;Neuromuscular re-education;Therapeutic exercise;Therapeutic activities;Patient/family education;Manual techniques;Passive range of motion;Dry needling;Spinal Manipulations;Joint Manipulations    PT Next Visit Plan aquatic PT next,  core and hip strength, hip flexibility,  Has a catheter on the right side of his leg.    PT Home Exercise Plan Access Code: [email or web address]  Recommended  Other Services initial cert is signed    Consulted and Agree with Plan of Care Patient             Patient will benefit from skilled therapeutic intervention in order to improve the following deficits and impairments:  Decreased range of motion, Increased fascial restricitons, Pain, Decreased activity tolerance, Decreased strength, Decreased mobility  Visit Diagnosis: Acute right-sided low back pain without sciatica  Muscle weakness (generalized)  Muscle spasm of back     Problem List Patient Active Problem List   Diagnosis Date Noted   Coronary artery calcification seen on CT scan 06/17/2020   Essential hypertension 06/17/2020   Mixed hyperlipidemia 06/17/2020   Preop cardiovascular exam 06/17/2020   Paroxysmal atrial fibrillation ([place]) 11/13/2017   Hypertensive heart disease without CHF 11/13/2017   Chronic kidney disease (CKD), stage III (moderate) ([place]) 11/13/2017   Hypothyroidism 11/13/2017   Prostate cancer ([place]) 11/13/2017   CAD (coronary artery disease), native coronary artery 11/13/2017   Myasthenia gravis ([place]) 01/07/2015  Sigurd Sos, PT 10/26/20 4:03 PM  [facility] [address], [place], [place], [address] Phone: [phone]   Fax:  [phone]  Name: EDINSON DOMEIER MRN: [id number] Date of Birth: 10/28/1945

## 2020-10-26 NOTE — Patient Instructions (Signed)
Access Code: [email or web address] URL: [email or web address] Date: 10/26/2020 Prepared by: Claiborne Billings  Exercises  Seated Hamstring Stretch - 2 x daily - 7 x weekly - 1 sets - 3 reps - 30 hold Seated Figure 4 Piriformis Stretch - 2 x daily - 7 x weekly - 1 sets - 3 reps - 30 hold Supine Lower Trunk Rotation - 2 x daily - 7 x weekly - 1 sets - 3 reps - 30 hold

## 2020-10-28 ENCOUNTER — Encounter: Payer: Self-pay | Admitting: Physical Therapy

## 2020-10-28 ENCOUNTER — Ambulatory Visit: Payer: Medicare Other | Admitting: Physical Therapy

## 2020-10-28 ENCOUNTER — Other Ambulatory Visit: Payer: Self-pay

## 2020-10-28 DIAGNOSIS — M545 Low back pain, unspecified: Secondary | ICD-10-CM

## 2020-10-28 DIAGNOSIS — M6281 Muscle weakness (generalized): Secondary | ICD-10-CM | POA: Diagnosis not present

## 2020-10-28 DIAGNOSIS — M62830 Muscle spasm of back: Secondary | ICD-10-CM | POA: Diagnosis not present

## 2020-10-28 NOTE — Therapy (Signed)
[facility] [address] [place], [place], [address] Phone: [phone]   Fax:  [phone]  Physical Therapy Treatment  Patient Details  Name: Eugene Castaneda MRN: [id number] Date of Birth: 1945/11/24 Referring Provider (PT): Dr. Jean Rosenthal   Encounter Date: 10/28/2020   PT End of Session - 10/28/20 0913     Visit Number 3    Date for PT Re-Evaluation 01/13/21    Authorization Type Medicare    Authorization - Visit Number 3    Authorization - Number of Visits 10    PT Start Time 3267    PT Stop Time 1015    PT Time Calculation (min) 61 min    Activity Tolerance Patient tolerated treatment well;No increased pain    Behavior During Therapy [facility] for tasks assessed/performed             Past Medical History:  Diagnosis Date   CAD (coronary artery disease), native coronary artery 11/13/2017   Coronary calcification noted on CT scan    Chronic kidney disease (CKD), stage III (moderate) ([place]) 11/13/2017   Diplopia    Foley catheter present    pt has supra-pubic foley catheter in place/since 2016   GERD (gastroesophageal reflux disease)    Hyperlipemia    Hypertension    Hypertensive heart disease without CHF 11/13/2017   Hypothyroidism    Impaired fasting glucose    MG (myasthenia gravis) ([place])    Paroxysmal atrial fibrillation ([place]) 11/13/2017   RF ablation Dr. 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;  Service: Cardiovascular;  Laterality: N/A;    ATRIAL FIBRILLATION ABLATION N/A 02/06/2020   Procedure: ATRIAL FIBRILLATION ABLATION;  Surgeon: [name], MD;  Location: [place] [facility];  Service: Cardiovascular;  Laterality: N/A;   catheter ablationfor afib  2010   COLONOSCOPY     PROSTATECTOMY  2009   TONSILLECTOMY      There were no vitals filed for this visit.   Subjective Assessment - 10/28/20 1034     Subjective I have to watch what kind of surface i sit on. Soft is not good. My bacis ok today.    Currently in Pain? No/denies    Aggravating Factors  Sitting on soft seats, long periods of sitting    Pain Relieving Factors Stretching, my exercises,sitting on firmer surfaces,    Multiple Pain Sites No            Treatment: Patient seen for aquatic therapy today.  Treatment took place in water 2.5-4 feet deep depending upon activity.  Pt entered the pool via stairs reciprocally. Pt requires buoyancy of water for support and to offload joints with strengthening exercises.  Water temp 92 degrees F.  Seated water bench with 75% submersion Pt performed seated LE AROM exercises 20x in all planes, concurrent discussion and education on water principles and how to adapt them to his needs, compression vs decompression, and the following stretches he could do in he shallow end: Bil quad, hamstring/gastroc, and lumbar stretches: 2x each  30 sec. Standing core initiation ex with both large noodle and kick board: 3x 5. Decompression float with large noodle x 2 min.                            PT Education - 10/28/20 1041     Education Details Water principles, compression vs decompressions, water stretches to add to his program              PT Short Term Goals - 10/21/20 1625       PT SHORT TERM GOAL #1   Title Patient is indepedent with initial HEP for flexibility    Time 4    Period Weeks    Status New    Target Date 11/18/20      PT SHORT TERM GOAL #2   Title Patient understands ways to manage  his back pain with correct body mechanics with home tasks, lifting, and turning in bed    Time 4    Period Weeks    Status New    Target Date 11/18/20               PT Long Term Goals - 10/21/20 1627       PT LONG TERM GOAL #1   Title Patient independent with advanced HEP for core stabilization and aquatic program he can do in the pool    Time 8    Period Weeks    Status New    Target Date 12/16/20      PT LONG TERM GOAL #2   Title Patient is able to sit in a soft chair with minimal pain due to improved lumbar flexibilty    Time 8    Period Weeks    Status New    Target Date 12/16/20      PT LONG TERM GOAL #3   Title Patient is able to bend forward with minimal pain due to improved trunk flexion and mobility of the lumbar tissue    Time 8    Period Weeks    Status New    Target Date 12/16/20      PT LONG TERM GOAL #4   Title FOTO socre >/= 54 due to less pain, increased mobility, and correct body mechanics    Time 8    Period Weeks    Status New    Target Date 12/16/20                   Plan - 10/28/20 1036     Clinical Impression Statement Pt arrives for first aquatic PT session. Pt is interested in taking his current swimming program and adding to it anything that would be beneficial to managing his low back pain. Verbal education in water pricniples and how to adapt them to his bodys needs, compression vs decompression and how to use decompression to reduce lumbar discomfort,and aquatic stretches he can add to already existing program. Pt had no pain performing exercises in the water and verbally reports he feels he can put todays suggestions into his program beginning this week.    Personal Factors and Comorbidities Comorbidity 3+;Fitness    Comorbidities Prostate cancer; Myasthenia Gravis; CAD; CKD; Hypothyroidism; Hypertensive heart disease    Examination-Activity Limitations Sit;Bend    Stability/Clinical Decision Making Evolving/Moderate complexity     Rehab Potential Excellent    PT Frequency 2x / week    PT Duration 8 weeks    PT  Treatment/Interventions Aquatic Therapy;Neuromuscular re-education;Therapeutic exercise;Therapeutic activities;Patient/family education;Manual techniques;Passive range of motion;Dry needling;Spinal Manipulations;Joint Manipulations    PT Next Visit Plan See how he did adding in new exercies to his swimming program. Please print out from [facility] aquatic exercises to give to pt.    PT Home Exercise Plan Access Code: [email or web address]             Patient will benefit from skilled therapeutic intervention in order to improve the following deficits and impairments:  Decreased range of motion, Increased fascial restricitons, Pain, Decreased activity tolerance, Decreased strength, Decreased mobility  Visit Diagnosis: Acute right-sided low back pain without sciatica  Muscle weakness (generalized)  Muscle spasm of back     Problem List Patient Active Problem List   Diagnosis Date Noted   Coronary artery calcification seen on CT scan 06/17/2020   Essential hypertension 06/17/2020   Mixed hyperlipidemia 06/17/2020   Preop cardiovascular exam 06/17/2020   Paroxysmal atrial fibrillation ([place]) 11/13/2017   Hypertensive heart disease without CHF 11/13/2017   Chronic kidney disease (CKD), stage III (moderate) ([place]) 11/13/2017   Hypothyroidism 11/13/2017   Prostate cancer ([place]) 11/13/2017   CAD (coronary artery disease), native coronary artery 11/13/2017   Myasthenia gravis ([place]) 01/07/2015    Reyna Lorenzi, PTA 10/28/2020, 10:42 AM  [facility] [address] [place], [place], [address] Phone: [phone]   Fax:  [phone]  Name: Eugene Castaneda MRN: [id number] Date of Birth: 04/07/1945

## 2020-10-31 DIAGNOSIS — Z23 Encounter for immunization: Secondary | ICD-10-CM | POA: Diagnosis not present

## 2020-11-02 DIAGNOSIS — Z1212 Encounter for screening for malignant neoplasm of rectum: Secondary | ICD-10-CM | POA: Diagnosis not present

## 2020-11-04 ENCOUNTER — Ambulatory Visit: Payer: Medicare Other | Attending: Orthopaedic Surgery

## 2020-11-04 ENCOUNTER — Other Ambulatory Visit: Payer: Self-pay

## 2020-11-04 DIAGNOSIS — M62830 Muscle spasm of back: Secondary | ICD-10-CM | POA: Insufficient documentation

## 2020-11-04 DIAGNOSIS — M545 Low back pain, unspecified: Secondary | ICD-10-CM | POA: Diagnosis not present

## 2020-11-04 DIAGNOSIS — M6281 Muscle weakness (generalized): Secondary | ICD-10-CM | POA: Insufficient documentation

## 2020-11-04 NOTE — Therapy (Signed)
[facility] @ [place], [place], [address] Phone:     Fax:     Physical Therapy Treatment  Patient Details  Name: Eugene Castaneda MRN: [id number] Date of Birth: 1945/03/06 Referring Provider (PT): Dr. Jean Rosenthal   Encounter Date: 11/04/2020   PT End of Session - 11/04/20 1140     Visit Number 4    Date for PT Re-Evaluation 01/13/21    Authorization Type Medicare    Authorization - Visit Number 4    Authorization - Number of Visits 10    Progress Note Due on Visit 10    PT Start Time 9983    PT Stop Time 1140    PT Time Calculation (min) 38 min    Activity Tolerance Patient tolerated treatment well;No increased pain    Behavior During Therapy [facility] for tasks assessed/performed             Past Medical History:  Diagnosis Date   CAD (coronary artery disease), native coronary artery 11/13/2017   Coronary calcification noted on CT scan    Chronic kidney disease (CKD), stage III (moderate) ([place]) 11/13/2017   Diplopia    Foley catheter present    pt has supra-pubic foley catheter in place/since 2016   GERD (gastroesophageal reflux disease)    Hyperlipemia    Hypertension    Hypertensive heart disease without CHF 11/13/2017   Hypothyroidism    Impaired fasting glucose    MG (myasthenia gravis) ([place])    Paroxysmal atrial fibrillation ([place]) 11/13/2017   RF ablation Dr. 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   Subjective Assessment - 11/04/20 1109     Subjective I was sore from picking up sticks in my yard.  I have been feeling better when I stretch.    Currently in Pain? No/denies                               [facility] Adult PT Treatment/Exercise - 11/04/20 0001       Lumbar Exercises: Stretches   Active Hamstring Stretch Left;Right;3 reps;30 seconds    Active Hamstring Stretch Limitations seated    Lower Trunk Rotation 3 reps;20 seconds    Piriformis Stretch 3 reps;30 seconds    Figure 4 Stretch 3 reps;20 seconds    Figure 4 Stretch Limitations seated      Lumbar Exercises: Supine   Ab Set 20 reps    AB Set Limitations with ball squeeze    Clam 20 reps    Clam Limitations yellow loop  Lumbar Exercises: Sidelying   Clam 20 reps    Clam Limitations core bracing                       PT Short Term Goals - 11/04/20 1114       PT SHORT TERM GOAL #1   Title Patient is indepedent with initial HEP for flexibility    Status On-going               PT Long Term Goals - 11/04/20 1114       PT LONG TERM GOAL #1   Title Patient independent with advanced HEP for core stabilization and aquatic program he can do in the pool    Status On-going                   Plan - 11/04/20 1125     Clinical Impression Statement Pt had some increased pain over the weekend and the past day or 2 due to doing yardwork after bad weather.    Pt reports that the stretches are helping to reduce his pain.   Pt required tactile and demo cues for correct alignment with stretches. Frequent verbal cues for abdominal  contraction with exercise.  Pt with significant hamstring length limitations bilaterally and was able to demonstrate improved alignment.  Pt will have aquatic session next.  Pt swims regularly at the [facility].  Pt will continue to benefit from skilled PT to address LBP, tissue mobility and lumbopelvic strength.    PT Frequency 2x / week    PT Duration 8 weeks    PT Treatment/Interventions Aquatic Therapy;Neuromuscular re-education;Therapeutic exercise;Therapeutic activities;Patient/family education;Manual techniques;Passive range of motion;Dry needling;Spinal Manipulations;Joint Manipulations    PT Next Visit Plan See how he did adding in new exercies to his swimming program. Finalize land bases exercises (print core exercises) and pt will have 2 more sessions after next treatment.  Pt may want to add more land based exercise after final pool sessions.    PT Home Exercise Plan Access Code: [email or web address]    Consulted and Agree with Plan of Care Patient             Patient will benefit from skilled therapeutic intervention in order to improve the following deficits and impairments:  Decreased range of motion, Increased fascial restricitons, Pain, Decreased activity tolerance, Decreased strength, Decreased mobility  Visit Diagnosis: Acute right-sided low back pain without sciatica  Muscle weakness (generalized)  Muscle spasm of back     Problem List Patient Active Problem List   Diagnosis Date Noted   Coronary artery calcification seen on CT scan 06/17/2020   Essential hypertension 06/17/2020   Mixed hyperlipidemia 06/17/2020   Preop cardiovascular exam 06/17/2020   Paroxysmal atrial fibrillation ([place]) 11/13/2017   Hypertensive heart disease without CHF 11/13/2017   Chronic kidney disease (CKD), stage III (moderate) ([place]) 11/13/2017   Hypothyroidism 11/13/2017   Prostate cancer ([place]) 11/13/2017   CAD (coronary artery disease), native coronary artery 11/13/2017   Myasthenia gravis ([place])  01/07/2015   Sigurd Sos, PT 11/04/20 11:44 AM   [facility] @ [place], [place], [address] Phone:     Fax:     Name: Eugene Castaneda MRN: [id number] Date of Birth: [birth year]

## 2020-11-09 ENCOUNTER — Ambulatory Visit: Payer: Medicare Other | Admitting: Physical Therapy

## 2020-11-09 ENCOUNTER — Encounter: Payer: Self-pay | Admitting: Physical Therapy

## 2020-11-09 ENCOUNTER — Other Ambulatory Visit: Payer: Self-pay

## 2020-11-09 DIAGNOSIS — M545 Low back pain, unspecified: Secondary | ICD-10-CM | POA: Diagnosis not present

## 2020-11-09 DIAGNOSIS — M62830 Muscle spasm of back: Secondary | ICD-10-CM

## 2020-11-09 DIAGNOSIS — M6281 Muscle weakness (generalized): Secondary | ICD-10-CM | POA: Diagnosis not present

## 2020-11-09 NOTE — Therapy (Signed)
[facility] @ [place], [place], [address] Phone: [phone]   Fax:  [phone]  Physical Therapy Treatment  Patient Details  Name: Eugene Castaneda MRN: [id number] Date of Birth: 04-Jun-1945 Referring Provider (PT): Dr. Jean Rosenthal   Encounter Date: 11/09/2020   PT End of Session - 11/09/20 1526     Visit Number 5    Date for PT Re-Evaluation 01/13/21    Authorization Type Medicare    Authorization - Visit Number 5    Authorization - Number of Visits 10    Progress Note Due on Visit 10    PT Start Time 2542    PT Stop Time 7062    PT Time Calculation (min) 52 min    Activity Tolerance Patient tolerated treatment well;No increased pain    Behavior During Therapy [facility] for tasks assessed/performed             Past Medical History:  Diagnosis Date   CAD (coronary artery disease), native coronary artery 11/13/2017   Coronary calcification noted on CT scan    Chronic kidney disease (CKD), stage III (moderate) ([place]) 11/13/2017   Diplopia    Foley catheter present    pt has supra-pubic foley catheter in place/since 2016   GERD (gastroesophageal reflux disease)    Hyperlipemia    Hypertension    Hypertensive heart disease without CHF 11/13/2017   Hypothyroidism    Impaired fasting glucose    MG (myasthenia gravis) ([place])    Paroxysmal atrial fibrillation ([place]) 11/13/2017   RF ablation Dr. [name] 2010 [facility] CHADSVASC 3   Prostate cancer 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   Subjective Assessment - 11/09/20 1540     Subjective Still picking up th epoccasional stick. RT low back remains sore intermittently.    Currently in Pain? --   Hurts upon standing from sitting position                              [facility] Adult PT Treatment/Exercise - 11/09/20 0001       Self-Care   Self-Care Lifting    Lifting golfers lift and hip hinge lift: PTA demo, pt verbally understood principles      Lumbar Exercises: Stretches   Active Hamstring Stretch Left;Right;3 reps;30 seconds    Active Hamstring Stretch Limitations supine with strap and seated    Lower Trunk Rotation 3 reps;20 seconds   VC to draw pelvis down and away esp on the RT     Lumbar Exercises: Aerobic   UBE (Upper Arm Bike) L1 x 6 min with PTA present  to discuss status      Lumbar Exercises: Supine   Clam 20 reps;10 reps    Clam Limitations blue band too hard/increased pain, reduced to red band and was ok      Lumbar Exercises: Sidelying   Clam 10 reps    Clam Limitations core bracing:add red band      Manual Therapy   Manual Therapy Soft tissue mobilization    Soft tissue mobilization RT QL in sidelying both manual and with [place] assit.                     PT Education - 11/09/20 1622     Education Details HEp    Person(s) Educated Patient    Methods Explanation;Demonstration;Tactile cues;Verbal cues;Handout    Comprehension Returned demonstration;Verbalized understanding              PT Short Term Goals - 11/04/20 1114       PT SHORT TERM GOAL #1   Title Patient is indepedent with initial  HEP for flexibility    Status On-going               PT Long Term Goals - 11/04/20 1114       PT LONG TERM GOAL #1   Title Patient independent with advanced HEP for core stabilization and aquatic program he can do in the pool    Status On-going                   Plan - 11/09/20 1543     Clinical Impression Statement Pt still pickin gup the occasional stick which means continued unsupported forward bending. This continues to exacerbate his RT LB pain but only intermittently. pt was educated on how to use the golfers lift and hip hinge to pick up items. Soft tissue work to RT QL/lower lumbar was helpful perpt report. PTA added to HEP, supine and sidelying resisted clamshells, red band given for home. pt will see ortho MD this week    Personal Factors and Comorbidities Comorbidity 3+;Fitness    Comorbidities Prostate cancer; Myasthenia Gravis; CAD; CKD; Hypothyroidism; Hypertensive heart disease    Examination-Activity Limitations Sit;Bend    Stability/Clinical Decision Making Evolving/Moderate complexity    Rehab Potential Excellent    PT Frequency 2x / week    PT Duration 8 weeks    PT Treatment/Interventions Aquatic Therapy;Neuromuscular re-education;Therapeutic exercise;Therapeutic activities;Patient/family education;Manual techniques;Passive range of motion;Dry needling;Spinal Manipulations;Joint Manipulations    PT Next Visit Plan See what MD says.    PT Home Exercise Plan Access Code: [email or web address]    Consulted and Agree with Plan of Care Patient             Patient will benefit from skilled therapeutic intervention in order to improve the following deficits and impairments:  Decreased range of motion, Increased fascial restricitons, Pain, Decreased activity tolerance, Decreased strength, Decreased mobility  Visit Diagnosis: Acute right-sided low back pain without sciatica  Muscle weakness (generalized)  Muscle spasm of back     Problem List Patient Active  Problem List   Diagnosis Date Noted   Coronary artery calcification seen on CT scan 06/17/2020   Essential hypertension 06/17/2020   Mixed hyperlipidemia 06/17/2020   Preop cardiovascular exam 06/17/2020   Paroxysmal atrial fibrillation ([place]) 11/13/2017   Hypertensive heart disease without CHF 11/13/2017   Chronic kidney disease (CKD), stage III (moderate) ([place]) 11/13/2017   Hypothyroidism 11/13/2017   Prostate cancer ([place]) 11/13/2017   CAD (coronary artery disease),  native coronary artery 11/13/2017   Myasthenia gravis ([place]) 01/07/2015    Cigi Bega, PTA 11/09/2020, 4:22 PM  [place] @ [place], [place], [address] Phone: [phone]   Fax:  [phone]  Name: Eugene Castaneda MRN: [id number] Date of Birth: [birth year]  Access Code: [facility]: [email or web address]: 10/10/2022Prepared by: Anderson Malta CochranExercises  Supine Hamstring Stretch with Strap - 1 x daily - 7 x weekly - 1 sets - 2 reps - 30 sec hold  Supine Piriformis Stretch with Leg Straight - 1 x daily - 7 x weekly - 1 sets - 2 reps - 30 sec hold  Hooklying Active Hamstring Stretch - 1 x daily - 7 x weekly - 1 sets - 2 reps - 30 sec hold  Standing Hip Flexor Stretch - 1 x daily - 7 x weekly - 1 sets - 2 reps - 30 sec hold  Seated Hamstring Stretch - 2 x daily - 7 x weekly - 1 sets - 3 reps - 30 hold  Seated Figure 4 Piriformis Stretch - 2 x daily - 7 x weekly - 1 sets - 3 reps - 30 hold  Supine Lower Trunk Rotation - 2 x daily - 7 x weekly - 1 sets - 3 reps - 30 hold  Active Hamstring Stretch on Pool Wall - 1 x daily - 7 x weekly - 3 sets - 3 reps  Quad Stretch at [employer] - 1 x daily - 7 x weekly - 3 sets - 3 reps  Hamstring Stretch at [employer] - 1 x daily - 7 x weekly - 3 sets  Bent Knee Fallouts - 1 x daily - 7 x weekly - 2 sets - 10 reps  Clam with Resistance - 1 x daily - 7 x weekly - 1 sets - 10 reps

## 2020-11-11 ENCOUNTER — Ambulatory Visit (INDEPENDENT_AMBULATORY_CARE_PROVIDER_SITE_OTHER): Payer: Medicare Other | Admitting: Orthopaedic Surgery

## 2020-11-11 ENCOUNTER — Encounter: Payer: Self-pay | Admitting: Orthopaedic Surgery

## 2020-11-11 ENCOUNTER — Other Ambulatory Visit: Payer: Self-pay

## 2020-11-11 DIAGNOSIS — I2510 Atherosclerotic heart disease of native coronary artery without angina pectoris: Secondary | ICD-10-CM | POA: Diagnosis not present

## 2020-11-11 DIAGNOSIS — M545 Low back pain, unspecified: Secondary | ICD-10-CM | POA: Diagnosis not present

## 2020-11-11 DIAGNOSIS — M4807 Spinal stenosis, lumbosacral region: Secondary | ICD-10-CM

## 2020-11-11 NOTE — Progress Notes (Signed)
The patient is a 75 year old gentleman being seen in follow-up for right-sided low back pain.  He has been through physical therapy.  He is an avid [employer].  The physical therapy is helped originally but now his pain may be a little worse.  He is on Eliquis as well but can come off Eliquis for procedures without being bridged with any other blood thinner.  He does have history of prostate cancer and has been worring him in terms of making sure he does not have any metastatic process.  He still denies any midline low back pain and does not have pain or radicular symptoms going down his legs.  Most of his pain seems to be still facet joint mediated to the right side at L4-L5 versus L5-S1.  At this point we will send him for MRI of his lumbar spine to assess the degree of stenosis and certainly to rule out any type of metastatic process given his history of prostate cancer.  This is also warranted given the failure of conservative treatment including physical therapy.  He will call for follow-up appointment once he knows the date of his MRI.  All questions and concerns were answered and addressed.

## 2020-11-12 ENCOUNTER — Other Ambulatory Visit (INDEPENDENT_AMBULATORY_CARE_PROVIDER_SITE_OTHER): Payer: Medicare Other

## 2020-11-12 DIAGNOSIS — D649 Anemia, unspecified: Secondary | ICD-10-CM

## 2020-11-12 DIAGNOSIS — M4807 Spinal stenosis, lumbosacral region: Secondary | ICD-10-CM

## 2020-11-12 LAB — CBC WITH DIFFERENTIAL/PLATELET
Basophils Absolute: 0 10*3/uL (ref 0.0–0.1)
Basophils Relative: 0.6 % (ref 0.0–3.0)
Eosinophils Absolute: 0.1 10*3/uL (ref 0.0–0.7)
Eosinophils Relative: 2.3 % (ref 0.0–5.0)
HCT: 38.2 % — ABNORMAL LOW (ref 39.0–52.0)
Hemoglobin: 13.1 g/dL (ref 13.0–17.0)
Lymphocytes Relative: 20.5 % (ref 12.0–46.0)
Lymphs Abs: 0.9 10*3/uL (ref 0.7–4.0)
MCHC: 34.2 g/dL (ref 30.0–36.0)
MCV: 100.2 fl — ABNORMAL HIGH (ref 78.0–100.0)
Monocytes Absolute: 0.5 10*3/uL (ref 0.1–1.0)
Monocytes Relative: 12.7 % — ABNORMAL HIGH (ref 3.0–12.0)
Neutro Abs: 2.7 10*3/uL (ref 1.4–7.7)
Neutrophils Relative %: 63.9 % (ref 43.0–77.0)
Platelets: 231 10*3/uL (ref 150.0–400.0)
RBC: 3.81 Mil/uL — ABNORMAL LOW (ref 4.22–5.81)
RDW: 15.5 % (ref 11.5–15.5)
WBC: 4.2 10*3/uL (ref 4.0–10.5)

## 2020-11-12 LAB — FERRITIN: Ferritin: 35.8 ng/mL (ref 22.0–322.0)

## 2020-11-16 DIAGNOSIS — Z08 Encounter for follow-up examination after completed treatment for malignant neoplasm: Secondary | ICD-10-CM | POA: Diagnosis not present

## 2020-11-16 DIAGNOSIS — Z85828 Personal history of other malignant neoplasm of skin: Secondary | ICD-10-CM | POA: Diagnosis not present

## 2020-11-16 DIAGNOSIS — L570 Actinic keratosis: Secondary | ICD-10-CM | POA: Diagnosis not present

## 2020-11-16 DIAGNOSIS — L309 Dermatitis, unspecified: Secondary | ICD-10-CM | POA: Diagnosis not present

## 2020-11-16 DIAGNOSIS — D225 Melanocytic nevi of trunk: Secondary | ICD-10-CM | POA: Diagnosis not present

## 2020-11-16 DIAGNOSIS — L821 Other seborrheic keratosis: Secondary | ICD-10-CM | POA: Diagnosis not present

## 2020-11-16 DIAGNOSIS — L814 Other melanin hyperpigmentation: Secondary | ICD-10-CM | POA: Diagnosis not present

## 2020-11-17 ENCOUNTER — Ambulatory Visit (INDEPENDENT_AMBULATORY_CARE_PROVIDER_SITE_OTHER): Payer: Medicare Other | Admitting: Internal Medicine

## 2020-11-17 ENCOUNTER — Encounter: Payer: Self-pay | Admitting: Internal Medicine

## 2020-11-17 VITALS — BP 122/64 | HR 50 | Ht 71.0 in | Wt 191.2 lb

## 2020-11-17 DIAGNOSIS — K219 Gastro-esophageal reflux disease without esophagitis: Secondary | ICD-10-CM | POA: Diagnosis not present

## 2020-11-17 DIAGNOSIS — I2510 Atherosclerotic heart disease of native coronary artery without angina pectoris: Secondary | ICD-10-CM

## 2020-11-17 DIAGNOSIS — Z86010 Personal history of colonic polyps: Secondary | ICD-10-CM | POA: Diagnosis not present

## 2020-11-17 DIAGNOSIS — D509 Iron deficiency anemia, unspecified: Secondary | ICD-10-CM

## 2020-11-17 DIAGNOSIS — Z7901 Long term (current) use of anticoagulants: Secondary | ICD-10-CM | POA: Diagnosis not present

## 2020-11-17 DIAGNOSIS — D132 Benign neoplasm of duodenum: Secondary | ICD-10-CM

## 2020-11-17 NOTE — Progress Notes (Signed)
HISTORY OF PRESENT ILLNESS:  Eugene Castaneda is a 75 y.o. male with multiple medical problems as listed below who was evaluated May 13, 2020 regarding normocytic anemia and Hemoccult positive stool.  See that dictation.  He also has a history of chronic GERD and adenomatous colon polyps.  He is on chronic Eliquis therapy for atrial fibrillation status post ablation x2.  He was placed on iron therapy due to low ferritin level.  He subsequently underwent colonoscopy and upper endoscopy August 12, 2020.  Colonoscopy revealed a diminutive transverse colon polyp, left-sided diverticulosis, and internal hemorrhoids.  No other abnormalities.  Follow-up in 5 years to be considered.  Upper endoscopy revealed mild esophagitis and antral erythema.  Biopsies were unremarkable.  He did have a duodenal polyp that was removed with a snare polypectomy.  This returned showing a duodenal adenoma.  This was felt to be completely resected.  He has continued on iron twice daily.  He has no GI complaints.  Most recent blood work from November 12, 2020 shows normal hemoglobin of 13.1 and normal ferritin level of 35.  REVIEW OF SYSTEMS:  All non-GI ROS negative unless otherwise stated in the HPI except for back pain, urinary leakage  Past Medical History:  Diagnosis Date   CAD (coronary artery disease), native coronary artery 11/13/2017   Coronary calcification noted on CT scan    Chronic kidney disease (CKD), stage III (moderate) ([place]) 11/13/2017   Diplopia    Foley catheter present    pt has supra-pubic foley catheter in place/since 2016   GERD (gastroesophageal reflux disease)    Hyperlipemia    Hypertension    Hypertensive heart disease without CHF 11/13/2017   Hypothyroidism    Impaired fasting glucose    MG (myasthenia gravis) ([place])    Paroxysmal atrial fibrillation ([place]) 11/13/2017   RF ablation Dr. 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   has supra-pubic foley catheter put in 2016/sphincter removed in 2017 replaced with the catheter   [place] N/A 07/23/2019   Procedure: ATRIAL FIBRILLATION ABLATION;  Surgeon: [name], MD;  Location: [place] [facility];  Service: Cardiovascular;  Laterality: N/A;   ATRIAL FIBRILLATION ABLATION N/A 02/06/2020   Procedure: ATRIAL FIBRILLATION ABLATION;  Surgeon: [name], MD;  Location: [place] [facility];  Service: Cardiovascular;  Laterality: N/A;   catheter ablationfor afib  2010   COLONOSCOPY     PROSTATECTOMY  2009   TONSILLECTOMY      Social History Eugene Castaneda  reports that he quit smoking about 40 years ago. His smoking use included cigarettes. He has never used smokeless tobacco. He reports current alcohol use. He reports that he does not use drugs.  family history includes Breast cancer in his mother; Parkinson's disease in his father.  No Known Allergies     PHYSICAL EXAMINATION: Vital signs: BP 122/64   Pulse (!) 50   Ht 5\' 11"  (1.803 m)   Wt 191 lb 4 oz (86.8 kg)   SpO2 99%   BMI 26.67 kg/m   Constitutional: generally well-appearing, no acute distress Psychiatric: alert and oriented x3, cooperative Eyes: Anicteric Abdomen: Not reexamined Skin: no lesions on visible extremities no jaundice Neuro: No gross deficits  ASSESSMENT:  1.  Iron deficiency  anemia.  Suspect nonspecific intermittent mucosal oozing from chronic anticoagulation therapy.  Extensive GI work-up as described.  Good response to iron with normalization of blood counts and iron levels. 2.  History of adenomatous colon polyps.  Surveillance up-to-date 3.  Incidental duodenal adenoma.  Removed 4.  GERD.  On PPI  PLAN:  1.  Continue daily iron supplementation 2.  Continue PPI 3.  Repeat EGD in 1 year to reevaluate the  area where the small duodenal adenomatous polyp was removed..  I asked the nurse to place a recall in the computer system today. 4.  Consider colonoscopy in 5 years 5.  Interval GI follow-up as needed 6.  Resume general medical care with Dr. Dagmar Hait A total time of 30 minutes was spent preparing to see the patient, reviewing test, obtaining comprehensive history, performing medically appropriate physical examination, counseling the patient regarding the above listed issues, directing medical therapy, arranging follow-up, and documenting clinical information in the health record

## 2020-11-17 NOTE — Patient Instructions (Signed)
Continue Iron as directed.   You will be due for a recall EGD in 1 year 2023. We will send you a reminder in the mail when it gets closer to that time.  If you are age 76 or older, your body mass index should be between 23-30. Your Body mass index is 26.67 kg/m. If this is out of the aforementioned range listed, please consider follow up with your Primary Care Provider.  __________________________________________________________  The [facility] providers would like to encourage you to use [facility] to communicate with providers for non-urgent requests or questions.  Due to long hold times on the telephone, sending your provider a message by [facility] may be a faster and more efficient way to get a response.  Please allow 48 business hours for a response.  Please remember that this is for non-urgent requests.   Thank you for choosing me and [facility].  Dr.John Henrene Pastor

## 2020-11-18 ENCOUNTER — Encounter: Payer: Self-pay | Admitting: Physical Therapy

## 2020-11-18 ENCOUNTER — Other Ambulatory Visit: Payer: Self-pay

## 2020-11-18 ENCOUNTER — Ambulatory Visit: Payer: Medicare Other | Admitting: Physical Therapy

## 2020-11-18 DIAGNOSIS — M62830 Muscle spasm of back: Secondary | ICD-10-CM | POA: Diagnosis not present

## 2020-11-18 DIAGNOSIS — M6281 Muscle weakness (generalized): Secondary | ICD-10-CM

## 2020-11-18 DIAGNOSIS — M545 Low back pain, unspecified: Secondary | ICD-10-CM

## 2020-11-18 NOTE — Therapy (Signed)
[facility] @ [place], [place], [address] Phone: [phone]   Fax:  [phone]  Physical Therapy Treatment  Patient Details  Name: Eugene Castaneda MRN: [id number] Date of Birth: [birth year] Referring Provider (PT): Dr. Jean Rosenthal   Encounter Date: 11/18/2020   PT End of Session - 11/18/20 0920     Visit Number 6    Date for PT Re-Evaluation 01/13/21    Authorization Type Medicare    Authorization - Visit Number 6    Authorization - Number of Visits 10    Progress Note Due on Visit 10    PT Start Time 0840    PT Stop Time 0920    PT Time Calculation (min) 40 min    Activity Tolerance Patient tolerated treatment well;No increased pain    Behavior During Therapy [facility] for tasks assessed/performed             Past Medical History:  Diagnosis Date   CAD (coronary artery disease), native coronary artery 11/13/2017   Coronary calcification noted on CT scan    Chronic kidney disease (CKD), stage III (moderate) ([place]) 11/13/2017   Diplopia    Foley catheter present    pt has supra-pubic foley catheter in place/since 2016   GERD (gastroesophageal reflux disease)    Hyperlipemia    Hypertension    Hypertensive heart disease without CHF 11/13/2017   Hypothyroidism    Impaired fasting glucose    MG (myasthenia gravis) ([place])    Paroxysmal atrial fibrillation ([place]) 11/13/2017   RF ablation Dr. 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   Subjective Assessment - 11/18/20 1025     Subjective Better not picking up sticks often, saw Dr Ninfa Linden for back pain. Ordered MRI to R/U cancer. Continues with intermittent twinges but not like last 2 weeks when he was doing more forward/unsupported bending.    Currently in Pain? No/denies              Treatment; Patient seen for aquatic therapy today.  Treatment took place in water 2.5-4 feet deep depending upon activity.  Pt entered the pool via stairs, step to step with only caution, mild use of handrails. Pt requires buoyancy of water for support and to offload joints with strengthening exercises.  Water temp 94 degrees F.  Seated water bench with 75% submersion Pt performed seated LE AROM exercises 20x in all planes, concurrent discussion of MD appt and pain assessment.   75% depth: forward and backward walking with large noodle push & pull 10x each: VC for posture and taking slightly bigger steps especially  to stretch the hamstrings. Side stepping 10x with mitten hands adding squat.  Hamstring stretch on second step with ankle DF/PF 10x: 3x Bil 20 sec. Pt unabl eto achieve neutral spine for stretch.  Abdominal compressions with nekdoodle: 5 sec hold 10x Horizontal decompression float with 100% flotation and PTA providing lateral sway to mobilize trunk.                             PT Short Term Goals - 11/04/20 1114       PT SHORT TERM GOAL #1   Title Patient is indepedent with initial HEP for flexibility    Status On-going               PT Long Term Goals - 11/04/20 1114        PT LONG TERM GOAL #1   Title Patient independent with advanced HEP for core stabilization and aquatic program he can do in the pool    Status On-going                   Plan - 11/18/20 1027     Clinical Impression Statement Pt stopped picking up sticks in his yard and his back feels better. Pt understands better the importance of supported postures vs unsupported postures. Pt is having an MRI on his back this Sunday mainly to R/U cancer metastisis. Pt continues to swim laps on his own at th [facility]. Todays focus in aquatic PT was lengthening his back mucles, decompressing lumbar spine and improving his core contraction.    Personal Factors and Comorbidities Comorbidity 3+;Fitness    Comorbidities Prostate cancer; Myasthenia Gravis; CAD; CKD; Hypothyroidism; Hypertensive heart disease    Examination-Activity Limitations Sit;Bend    Stability/Clinical Decision Making Evolving/Moderate complexity    Rehab Potential Excellent    PT Frequency 2x / week    PT Duration 8 weeks    PT Treatment/Interventions Aquatic Therapy;Neuromuscular re-education;Therapeutic exercise;Therapeutic activities;Patient/family education;Manual techniques;Passive range of motion;Dry needling;Spinal Manipulations;Joint Manipulations    PT Next Visit Plan See what MRI says, aquatics next Wednesday    PT Home Exercise Plan Access Code: [email or web address]    Consulted and Agree with Plan of Care Patient             Patient will benefit from skilled therapeutic intervention in order to improve the following deficits and impairments:  Decreased range of motion, Increased fascial restricitons, Pain, Decreased activity tolerance, Decreased strength, Decreased mobility  Visit Diagnosis: Acute right-sided low back pain without sciatica  Muscle weakness (generalized)  Muscle spasm of back     Problem List Patient Active Problem List   Diagnosis Date Noted   Coronary artery calcification seen on CT scan 06/17/2020    Essential hypertension 06/17/2020   Mixed hyperlipidemia 06/17/2020   Preop cardiovascular exam 06/17/2020   Paroxysmal atrial fibrillation ([place]) 11/13/2017   Hypertensive heart disease without CHF 11/13/2017   Chronic kidney disease (CKD), stage III (moderate) ([place]) 11/13/2017   Hypothyroidism 11/13/2017   Prostate cancer ([place]) 11/13/2017   CAD (coronary artery disease), native coronary artery 11/13/2017   Myasthenia gravis ([place]) 01/07/2015    Aarti Mankowski, PTA 11/18/2020, 10:30 AM  [facility] @ [place], [place], [address] Phone: [phone]   Fax:  [phone]  Name: Eugene Castaneda MRN: [id number] Date of Birth: January 13, 1946

## 2020-11-22 ENCOUNTER — Other Ambulatory Visit: Payer: Self-pay

## 2020-11-22 ENCOUNTER — Ambulatory Visit
Admission: RE | Admit: 2020-11-22 | Discharge: 2020-11-22 | Disposition: A | Discharge status: Home / Self Care | Payer: Medicare Other | Source: Ambulatory Visit | Attending: Orthopaedic Surgery | Admitting: Orthopaedic Surgery

## 2020-11-22 DIAGNOSIS — M48061 Spinal stenosis, lumbar region without neurogenic claudication: Secondary | ICD-10-CM | POA: Diagnosis not present

## 2020-11-22 DIAGNOSIS — M545 Low back pain, unspecified: Secondary | ICD-10-CM | POA: Diagnosis not present

## 2020-11-22 DIAGNOSIS — M4807 Spinal stenosis, lumbosacral region: Secondary | ICD-10-CM

## 2020-11-24 DIAGNOSIS — Z23 Encounter for immunization: Secondary | ICD-10-CM | POA: Diagnosis not present

## 2020-11-25 ENCOUNTER — Other Ambulatory Visit: Payer: Self-pay

## 2020-11-25 ENCOUNTER — Ambulatory Visit: Payer: Medicare Other | Admitting: Physical Therapy

## 2020-11-25 ENCOUNTER — Ambulatory Visit: Payer: Medicare Other

## 2020-11-25 DIAGNOSIS — M62830 Muscle spasm of back: Secondary | ICD-10-CM

## 2020-11-25 DIAGNOSIS — M545 Low back pain, unspecified: Secondary | ICD-10-CM | POA: Diagnosis not present

## 2020-11-25 DIAGNOSIS — M6281 Muscle weakness (generalized): Secondary | ICD-10-CM

## 2020-11-25 NOTE — Therapy (Signed)
[facility] @ [place], [place], [address] Phone: [phone]   Fax:  [phone]  Physical Therapy Treatment  Patient Details  Name: Eugene Castaneda MRN: [id number] Date of Birth: 1945/12/31 Referring Provider (PT): Dr. Jean Rosenthal   Encounter Date: 11/25/2020   PT End of Session - 11/25/20 1602     Visit Number 7    Date for PT Re-Evaluation 01/13/21    Authorization Type Medicare    Authorization - Visit Number 7    Authorization - Number of Visits 10    Progress Note Due on Visit 10    PT Start Time 1532    PT Stop Time 1603    PT Time Calculation (min) 31 min    Activity Tolerance Patient tolerated treatment well;No increased pain    Behavior During Therapy [facility] for tasks assessed/performed             Past Medical History:  Diagnosis Date   CAD (coronary artery disease), native coronary artery 11/13/2017   Coronary calcification noted on CT scan    Chronic kidney disease (CKD), stage III (moderate) ([place]) 11/13/2017   Diplopia    Foley catheter present    pt has supra-pubic foley catheter in place/since 2016   GERD (gastroesophageal reflux disease)    Hyperlipemia    Hypertension    Hypertensive heart disease without CHF 11/13/2017   Hypothyroidism    Impaired fasting glucose    MG (myasthenia gravis) ([place])    Paroxysmal atrial fibrillation ([place]) 11/13/2017   RF ablation Dr. [name] 2010 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   Subjective Assessment - 11/25/20 1534     Subjective I'm feeling good overall.  I do exercises when things start to bother me.  I had an MRI and will see Dr Ninfa Linden to discuss results.    Diagnostic tests MRI: lumbar stenosis    Currently in Pain? No/denies    Pain Score --   up to 4/10 with activity               [facility] PT Assessment - 11/25/20 0001       Observation/Other Assessments   Focus on Therapeutic Outcomes (FOTO)  70 (goal is 75)                           [facility] Adult PT Treatment/Exercise - 11/25/20 0001       Lumbar Exercises: Stretches   Active Hamstring Stretch Left;Right;3 reps;30 seconds    Active Hamstring Stretch Limitations seated    Lower Trunk Rotation 3 reps;20  seconds   VC to draw pelvis down and away esp on the RT   Piriformis Stretch 3 reps;30 seconds    Piriformis Stretch Limitations seated      Lumbar Exercises: Aerobic   UBE (Upper Arm Bike) L1 x 8 min with PT present to discuss status      Lumbar Exercises: Seated   Sit to Stand 20 reps    Sit to Stand Limitations holding 5# kettle bell    Other Seated Lumbar Exercises ab set with ball squeeze 5" 2x10      Lumbar Exercises: Supine   Clam 20 reps;10 reps    Clam Limitations yellow band                     PT Education - 11/25/20 1602     Education Details body mechanics with raking- abdominal bracing and reduce rotation    Person(s) Educated Patient    Methods Explanation;Demonstration;Handout    Comprehension Verbalized understanding;Returned demonstration              PT Short Term  Goals - 11/04/20 1114       PT SHORT TERM GOAL #1   Title Patient is indepedent with initial HEP for flexibility    Status On-going               PT Long Term Goals - 11/25/20 1543       PT LONG TERM GOAL #1   Title Patient independent with advanced HEP for core stabilization and aquatic program he can do in the pool    Status On-going      PT LONG TERM GOAL #2   Title Patient is able to sit in a soft chair with minimal pain due to improved lumbar flexibilty    Baseline I try to avoid this    Status On-going      PT LONG TERM GOAL #3   Title Patient is able to bend forward with minimal pain due to improved trunk flexion and mobility of the lumbar tissue    Status On-going      PT LONG TERM GOAL #4   Title FOTO socre >/= 54 due to less pain, increased mobility, and correct body mechanics    Baseline 70    Time 8    Period Weeks    Status On-going                   Plan - 11/25/20 1555     Clinical Impression Statement Pt reports that he is better able to manage his pain by doing his exercises when back pain is elevated.  Pt has been modifying his activity to reduce the amount of bending and lifting with yardwork and housework.  Pt continues to swim regularly and attends aquatic PT in addition to clinic treatments.  FOTO score is 70 indicating improved function (54 at evaluation).  Pt continues to report intermittent LBP that he is better able to manage but unable to verbalize overall improvement.  Pt will continue to benefit from skilled PT to address LBP, strength and flexibility to improve function.    PT Frequency 2x / week    PT Duration 8 weeks    PT Treatment/Interventions Aquatic Therapy;Neuromuscular re-education;Therapeutic exercise;Therapeutic activities;Patient/family education;Manual techniques;Passive range of motion;Dry needling;Spinal Manipulations;Joint Manipulations    PT Next Visit Plan continue flexibility and strength, aquatics    PT Home  Exercise Plan Access Code: [email or web address]    Consulted and Agree with  Plan of Care Patient             Patient will benefit from skilled therapeutic intervention in order to improve the following deficits and impairments:  Decreased range of motion, Increased fascial restricitons, Pain, Decreased activity tolerance, Decreased strength, Decreased mobility  Visit Diagnosis: Acute right-sided low back pain without sciatica  Muscle spasm of back  Muscle weakness (generalized)     Problem List Patient Active Problem List   Diagnosis Date Noted   Coronary artery calcification seen on CT scan 06/17/2020   Essential hypertension 06/17/2020   Mixed hyperlipidemia 06/17/2020   Preop cardiovascular exam 06/17/2020   Paroxysmal atrial fibrillation ([place]) 11/13/2017   Hypertensive heart disease without CHF 11/13/2017   Chronic kidney disease (CKD), stage III (moderate) ([place]) 11/13/2017   Hypothyroidism 11/13/2017   Prostate cancer ([place]) 11/13/2017   CAD (coronary artery disease), native coronary artery 11/13/2017   Myasthenia gravis ([place]) 01/07/2015    Sigurd Sos, PT 11/25/20 4:07 PM   [facility] @ [place], [place], [address] Phone: [phone]   Fax:  [phone]  Name: Eugene Castaneda MRN: [id number] Date of Birth: Jun 03, 1945

## 2020-11-30 ENCOUNTER — Ambulatory Visit: Payer: Medicare Other | Admitting: Physical Therapy

## 2020-11-30 ENCOUNTER — Encounter: Payer: Self-pay | Admitting: Physical Therapy

## 2020-11-30 ENCOUNTER — Other Ambulatory Visit: Payer: Self-pay

## 2020-11-30 DIAGNOSIS — M545 Low back pain, unspecified: Secondary | ICD-10-CM

## 2020-11-30 DIAGNOSIS — M6281 Muscle weakness (generalized): Secondary | ICD-10-CM | POA: Diagnosis not present

## 2020-11-30 DIAGNOSIS — M62830 Muscle spasm of back: Secondary | ICD-10-CM | POA: Diagnosis not present

## 2020-11-30 NOTE — Therapy (Signed)
[facility] @ [place], [place], [address] Phone: [phone]   Fax:  [phone]  Physical Therapy Treatment  Patient Details  Name: Eugene Castaneda MRN: [id number] Date of Birth: 08-Feb-1945 Referring Provider (PT): Dr. Jean Rosenthal   Encounter Date: 11/30/2020   PT End of Session - 11/30/20 1449     Visit Number 8    Date for PT Re-Evaluation 01/13/21    Authorization Type Medicare    Authorization - Visit Number 8    Authorization - Number of Visits 10    Progress Note Due on Visit 10    PT Start Time 1440    PT Stop Time 1521    PT Time Calculation (min) 41 min    Activity Tolerance Patient tolerated treatment well;No increased pain    Behavior During Therapy [facility] for tasks assessed/performed             Past Medical History:  Diagnosis Date   CAD (coronary artery disease), native coronary artery 11/13/2017   Coronary calcification noted on CT scan    Chronic kidney disease (CKD), stage III (moderate) ([place]) 11/13/2017   Diplopia    Foley catheter present    pt has supra-pubic foley catheter in place/since 2016   GERD (gastroesophageal reflux disease)    Hyperlipemia    Hypertension    Hypertensive heart disease without CHF 11/13/2017   Hypothyroidism    Impaired fasting glucose    MG (myasthenia gravis) ([place])    Paroxysmal atrial fibrillation ([place]) 11/13/2017   RF ablation Dr. 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   Subjective Assessment - 11/30/20 1451     Subjective The therapy is really helping. I have no pain today.    Diagnostic tests MRI: lumbar stenosis    Currently in Pain? No/denies    Multiple Pain Sites No                               [facility] Adult PT Treatment/Exercise - 11/30/20 0001       Lumbar Exercises: Stretches   Active Hamstring Stretch Left;Right;2 reps;30 seconds    Active Hamstring Stretch Limitations supine with strap    Lower Trunk Rotation 3 reps;20 seconds   VC to draw pelvis down and away esp on the RT   Piriformis Stretch 3 reps;30 seconds    Piriformis Stretch Limitations supine    Other Lumbar Stretch Exercise Posterior LE release with foam roll      Lumbar Exercises: Aerobic   UBE (Upper Arm Bike) L2 x  10 min with PTA present to discuss status      Lumbar Exercises: Seated   Sit to Stand 20 reps    Sit to Stand Limitations holding 5# kettle bell      Lumbar Exercises: Supine   Clam 20 reps    Clam Limitations red band                       PT Short Term Goals - 11/04/20 1114       PT SHORT TERM GOAL #1   Title Patient is indepedent with initial HEP for flexibility    Status On-going               PT Long Term Goals - 11/25/20 1543       PT LONG TERM GOAL #1   Title Patient independent with advanced HEP for core stabilization and aquatic program he can do in the pool    Status On-going      PT LONG TERM GOAL #2   Title Patient is able to sit in a soft chair with minimal pain due to improved lumbar flexibilty    Baseline I try to avoid this     Status On-going      PT LONG TERM GOAL #3   Title Patient is able to bend forward with minimal pain due to improved trunk flexion and mobility of the lumbar tissue    Status On-going      PT LONG TERM GOAL #4   Title FOTO socre >/= 54 due to less pain, increased mobility, and correct body mechanics    Baseline 70    Time 8    Period Weeks    Status On-going                   Plan - 11/30/20 1450     Clinical Impression Statement Pt arrives pain free and comments 'the therapy is really helping." Pt did have a lot of questions about stenosis and what that was. PTA answered those questions and was encouraged to speak with MD about results of MRI. Otherwise pt performing all stretches and strengthening exercies well without exacerbation.    Personal Factors and Comorbidities Comorbidity 3+;Fitness    Comorbidities Prostate cancer; Myasthenia Gravis; CAD; CKD; Hypothyroidism; Hypertensive heart disease    Examination-Activity Limitations Sit;Bend    Stability/Clinical Decision Making Evolving/Moderate complexity    Rehab Potential Excellent    PT Frequency 2x / week    PT Duration 8 weeks    PT Treatment/Interventions Aquatic Therapy;Neuromuscular re-education;Therapeutic exercise;Therapeutic activities;Patient/family education;Manual techniques;Passive range of motion;Dry needling;Spinal Manipulations;Joint Manipulations    PT Next Visit Plan continue flexibility and strength, aquatics    PT Home Exercise Plan Access Code: [email or web address]    Consulted and Agree with Plan of Care Patient             Patient will benefit from skilled therapeutic intervention in order to improve the following deficits and impairments:  Decreased range of motion, Increased fascial restricitons, Pain, Decreased activity tolerance, Decreased strength, Decreased mobility  Visit Diagnosis: Acute right-sided low back pain without sciatica  Muscle spasm of back  Muscle weakness  (generalized)     Problem List Patient Active Problem List   Diagnosis Date Noted   Coronary artery calcification seen on CT scan 06/17/2020   Essential hypertension 06/17/2020   Mixed hyperlipidemia 06/17/2020   Preop cardiovascular exam 06/17/2020   Paroxysmal atrial fibrillation ([place]) 11/13/2017   Hypertensive  heart disease without CHF 11/13/2017   Chronic kidney disease (CKD), stage III (moderate) ([place]) 11/13/2017   Hypothyroidism 11/13/2017   Prostate cancer ([place]) 11/13/2017   CAD (coronary artery disease), native coronary artery 11/13/2017   Myasthenia gravis ([place]) 01/07/2015    Raelynne Ludwick, PTA 11/30/2020, 3:22 PM  [facility] @ [place], [place], [address] Phone: [phone]   Fax:  [phone]  Name: Eugene Castaneda MRN: [id number] Date of Birth: 1945-10-06

## 2020-12-02 ENCOUNTER — Encounter: Payer: Self-pay | Admitting: Orthopaedic Surgery

## 2020-12-02 ENCOUNTER — Ambulatory Visit (INDEPENDENT_AMBULATORY_CARE_PROVIDER_SITE_OTHER): Payer: Medicare Other | Admitting: Orthopaedic Surgery

## 2020-12-02 ENCOUNTER — Ambulatory Visit: Payer: Medicare Other | Attending: Orthopaedic Surgery

## 2020-12-02 ENCOUNTER — Other Ambulatory Visit: Payer: Self-pay

## 2020-12-02 DIAGNOSIS — M62830 Muscle spasm of back: Secondary | ICD-10-CM | POA: Insufficient documentation

## 2020-12-02 DIAGNOSIS — M545 Low back pain, unspecified: Secondary | ICD-10-CM | POA: Diagnosis not present

## 2020-12-02 DIAGNOSIS — R339 Retention of urine, unspecified: Secondary | ICD-10-CM | POA: Diagnosis not present

## 2020-12-02 DIAGNOSIS — M25551 Pain in right hip: Secondary | ICD-10-CM

## 2020-12-02 DIAGNOSIS — I2510 Atherosclerotic heart disease of native coronary artery without angina pectoris: Secondary | ICD-10-CM | POA: Diagnosis not present

## 2020-12-02 DIAGNOSIS — M4807 Spinal stenosis, lumbosacral region: Secondary | ICD-10-CM

## 2020-12-02 DIAGNOSIS — M6281 Muscle weakness (generalized): Secondary | ICD-10-CM | POA: Diagnosis not present

## 2020-12-02 DIAGNOSIS — Z466 Encounter for fitting and adjustment of urinary device: Secondary | ICD-10-CM | POA: Diagnosis not present

## 2020-12-02 DIAGNOSIS — Z9359 Other cystostomy status: Secondary | ICD-10-CM | POA: Diagnosis not present

## 2020-12-02 NOTE — Therapy (Signed)
[facility] @ [place], [place], [address] Phone: [phone]   Fax:  [phone]  Physical Therapy Treatment  Patient Details  Name: Eugene Castaneda MRN: [id number] Date of Birth: 01/15/1946 Referring Provider (PT): Dr. Jean Rosenthal   Encounter Date: 12/02/2020   PT End of Session - 12/02/20 1617     Visit Number 9    Date for PT Re-Evaluation 01/13/21    Authorization Type Medicare    Authorization - Visit Number 9    Authorization - Number of Visits 10    Progress Note Due on Visit 10    PT Start Time 1532    PT Stop Time 5809    PT Time Calculation (min) 42 min    Activity Tolerance Patient tolerated treatment well;No increased pain    Behavior During Therapy [facility] for tasks assessed/performed             Past Medical History:  Diagnosis Date   CAD (coronary artery disease), native coronary artery 11/13/2017   Coronary calcification noted on CT scan    Chronic kidney disease (CKD), stage III (moderate) ([place]) 11/13/2017   Diplopia    Foley catheter present    pt has supra-pubic foley catheter in place/since 2016   GERD (gastroesophageal reflux disease)    Hyperlipemia    Hypertension    Hypertensive heart disease without CHF 11/13/2017   Hypothyroidism    Impaired fasting glucose    MG (myasthenia gravis) ([place])    Paroxysmal atrial fibrillation ([place]) 11/13/2017   RF ablation Dr. 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   Subjective Assessment - 12/02/20 1547     Subjective I saw the MD and he said that I could have injections if I needed them.  Therapy is helping.    Currently in Pain? No/denies                               [facility] Adult PT Treatment/Exercise - 12/02/20 0001       Lumbar Exercises: Stretches   Active Hamstring Stretch Left;Right;30 seconds;3 reps    Active Hamstring Stretch Limitations supine with strap    Lower Trunk Rotation 3 reps;20 seconds    Piriformis Stretch 3 reps;30 seconds    Piriformis Stretch Limitations supine      Lumbar Exercises: Aerobic   UBE (Upper Arm Bike) L2 x 10 min with PTA present to discuss status      Lumbar Exercises: Seated   Sit to Stand 20 reps    Sit to Stand Limitations  holding 5# kettle bell    Other Seated Lumbar Exercises press into foam roll 5" x20      Lumbar Exercises: Supine   Clam 20 reps    Clam Limitations red band                       PT Short Term Goals - 11/04/20 1114       PT SHORT TERM GOAL #1   Title Patient is indepedent with initial HEP for flexibility    Status On-going               PT Long Term Goals - 11/25/20 1543       PT LONG TERM GOAL #1   Title Patient independent with advanced HEP for core stabilization and aquatic program he can do in the pool    Status On-going      PT LONG TERM GOAL #2   Title Patient is able to sit in a soft chair with minimal pain due to improved lumbar flexibilty    Baseline I try to avoid this    Status On-going      PT LONG TERM GOAL #3   Title Patient is able to bend forward  with minimal pain due to improved trunk flexion and mobility of the lumbar tissue    Status On-going      PT LONG TERM GOAL #4   Title FOTO socre >/= 54 due to less pain, increased mobility, and correct body mechanics    Baseline 70    Time 8    Period Weeks    Status On-going                   Plan - 12/02/20 1558     Clinical Impression Statement MRI showed 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  Pt is considering a facet joint injection but will wait this out a little bit longer as he is improving.  Pt arrives pain free and comments 'the therapy is really helping." PT educated pt regarding spinal injections, particularly facet joint injections using spine model for education. Pt is swimming regularly and is compliant with HEP.  Pt will continue to benefit from skilled PT to address LBP, endurance and flexibility.    Rehab Potential Excellent    PT Frequency 2x / week    PT Duration 8 weeks    PT Treatment/Interventions Aquatic Therapy;Neuromuscular re-education;Therapeutic exercise;Therapeutic activities;Patient/family education;Manual techniques;Passive range of motion;Dry needling;Spinal Manipulations;Joint Manipulations    PT Next Visit Plan continue flexibility and strength, aquatics    PT Home Exercise Plan Access Code: [email or web address]    Consulted and Agree with Plan of Care Patient             Patient will benefit from skilled therapeutic intervention in order to improve the following deficits and impairments:  Decreased range of motion, Increased fascial restricitons, Pain, Decreased activity tolerance, Decreased strength, Decreased mobility  Visit Diagnosis: Acute right-sided low back pain without sciatica  Muscle spasm of back  Muscle weakness (generalized)     Problem List Patient Active Problem List   Diagnosis Date Noted   Coronary artery calcification seen on CT scan 06/17/2020   Essential hypertension 06/17/2020   Mixed  hyperlipidemia 06/17/2020   Preop cardiovascular exam 06/17/2020   Paroxysmal atrial fibrillation ([place]) 11/13/2017   Hypertensive heart disease without CHF 11/13/2017   Chronic kidney disease (CKD), stage III (moderate) ([place]) 11/13/2017   Hypothyroidism 11/13/2017  Prostate cancer ([place]) 11/13/2017   CAD (coronary artery disease), native coronary artery 11/13/2017   Myasthenia gravis ([place]) 01/07/2015    Sigurd Sos, PT 12/02/20 4:19 PM   [facility] @ [place], [place], [address] Phone: [phone]   Fax:  [phone]  Name: HAZIM TREADWAY MRN: [id number] Date of Birth: April 14, 1945

## 2020-12-02 NOTE — Progress Notes (Signed)
The patient comes in for follow-up after having a MRI of his lumbar spine.  He was having significant right-sided low back pain as well as radicular pain going down his right leg.  He has been going to physical therapy and I believe he is scheduled to go to physical therapy through mid December.  He said that now his pain is just in his back and therapy has helped him quite a bit and there is no radicular component.  The MRI of his lumbar spine does show multifactorial stenosis at L4-L5 and L5-S1 that is causing mild to moderate foraminal stenosis at those levels.  However he does have significant facet joint arthritis at those levels.  That seems to be corresponding now with the pain that he is having and there is no radicular component.  We talked about the possibility of a facet joint injection by Dr. Ernestina Patches if his symptoms do not improve.  However he is improving enough with therapy that we will hold off on any type of injection for now.  He is on Eliquis so that may factor into the timing of an injection.  At this point he is satisfied with following up as needed and continuing therapy.  I did let him know to call us if he decides to proceed with an injection in his back.  All question concerns were answered and addressed.

## 2020-12-07 ENCOUNTER — Ambulatory Visit: Payer: Medicare Other

## 2020-12-07 ENCOUNTER — Other Ambulatory Visit: Payer: Self-pay

## 2020-12-07 DIAGNOSIS — M6281 Muscle weakness (generalized): Secondary | ICD-10-CM

## 2020-12-07 DIAGNOSIS — M62830 Muscle spasm of back: Secondary | ICD-10-CM | POA: Diagnosis not present

## 2020-12-07 DIAGNOSIS — M545 Low back pain, unspecified: Secondary | ICD-10-CM

## 2020-12-07 NOTE — Therapy (Signed)
[facility] @ [place], [place], [address] Phone: [phone]   Fax:  [phone]  Physical Therapy Treatment  Patient Details  Name: Eugene Castaneda MRN: [id number] Date of Birth: [birth year] Referring Provider (PT): Dr. Jean Rosenthal   Encounter Date: 12/07/2020 Progress Note Reporting Period 10/21/20 to 12/07/20  See note below for Objective Data and Assessment of Progress/Goals.      PT End of Session - 12/07/20 1013     Visit Number 10    Date for PT Re-Evaluation 01/13/21    Authorization Type Medicare    Progress Note Due on Visit 20    PT Start Time 0931    PT Stop Time 1010    PT Time Calculation (min) 39 min    Activity Tolerance Patient tolerated treatment well;No increased pain    Behavior During Therapy [facility] for tasks assessed/performed             Past Medical History:  Diagnosis Date   CAD (coronary artery disease), native coronary artery 11/13/2017   Coronary calcification noted on CT scan    Chronic kidney disease (CKD), stage III (moderate) ([place]) 11/13/2017   Diplopia    Foley catheter present    pt has supra-pubic foley catheter in place/since 2016   GERD (gastroesophageal reflux disease)    Hyperlipemia    Hypertension    Hypertensive heart disease without CHF 11/13/2017   Hypothyroidism    Impaired fasting glucose    MG (myasthenia gravis) ([place])    Paroxysmal atrial fibrillation ([place]) 11/13/2017   RF ablation Dr. 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   Subjective Assessment - 12/07/20 0939     Subjective I have gone 5 days without pain.  This is the first time that I can say that.    Currently in Pain? No/denies                [facility] PT Assessment - 12/07/20 0001       Assessment   Medical Diagnosis M54.50 Low back pain, unspecified back pain, laterality, unspecified chronisity, unspecified whether sciatica present    Referring Provider (PT) Dr. Jean Rosenthal    Onset Date/Surgical Date 08/20/20      Prior Function   Level of Independence Independent      Observation/Other Assessments   Focus on Therapeutic Outcomes (FOTO)  70 (goal is 56)                           [facility] Adult PT Treatment/Exercise - 12/07/20 0001  Lumbar Exercises: Stretches   Active Hamstring Stretch Left;Right;30 seconds;3 reps    Lower Trunk Rotation 3 reps;20 seconds    Piriformis Stretch 3 reps;30 seconds    Piriformis Stretch Limitations supine      Lumbar Exercises: Aerobic   UBE (Upper Arm Bike) L2 x 10 min with PTA present to discuss status      Lumbar Exercises: Standing   Other Standing Lumbar Exercises Pallof press: red band 2x10 each      Lumbar Exercises: Seated   Sit to Stand 20 reps    Sit to Stand Limitations holding 5# kettle bell    Other Seated Lumbar Exercises press into foam roll 5" x20      Lumbar Exercises: Supine   Ab Set 20 reps    AB Set Limitations with ball squeeze    Clam 20 reps    Clam Limitations red band                       PT Short Term Goals - 11/04/20  1114       PT SHORT TERM GOAL #1   Title Patient is indepedent with initial HEP for flexibility    Status On-going               PT Long Term Goals - 12/07/20 0940       PT LONG TERM GOAL #1   Title Patient independent with advanced HEP for core stabilization and aquatic program he can do in the pool    Status On-going      PT LONG TERM GOAL #2   Title Patient is able to sit in a soft chair with minimal pain due to improved lumbar flexibilty    Baseline I try to avoid this    Status On-going      PT LONG TERM GOAL #3   Title Patient is able to bend forward with minimal pain due to improved trunk flexion and mobility of the lumbar tissue    Status Achieved      PT LONG TERM GOAL #4   Title FOTO socre >/= 75 due to less pain, increased mobility, and correct body mechanics    Baseline 70    Status On-going                   Plan - 12/07/20 0946     Clinical Impression Statement Pt without pain since last session and reports this is the first time since he started PT that he could say this. 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  Pt is considering a facet joint injection but will wait this out a little bit longer as he is improving. FOTO is improved from 106 to 70 indicating improved function.   Pt arrives pain free and comments 'the therapy is really helping." PT educated pt regarding spinal injections, particularly facet joint injections using spine model for education. Pt is swimming regularly and is compliant with HEP.  Pt will continue to benefit from skilled PT to address LBP, endurance and flexibility.    PT Frequency 2x / week    PT Duration 8 weeks    PT Treatment/Interventions Aquatic Therapy;Neuromuscular re-education;Therapeutic exercise;Therapeutic activities;Patient/family education;Manual techniques;Passive range of motion;Dry needling;Spinal Manipulations;Joint Manipulations    PT Next Visit Plan continue flexibility  and strength, aquatics    PT Home Exercise Plan Access Code: [email or web address]    Consulted and Agree with Plan of Care Patient  Patient will benefit from skilled therapeutic intervention in order to improve the following deficits and impairments:  Decreased range of motion, Increased fascial restricitons, Pain, Decreased activity tolerance, Decreased strength, Decreased mobility  Visit Diagnosis: Muscle spasm of back  Acute right-sided low back pain without sciatica  Muscle weakness (generalized)     Problem List Patient Active Problem List   Diagnosis Date Noted   Coronary artery calcification seen on CT scan 06/17/2020   Essential hypertension 06/17/2020   Mixed hyperlipidemia 06/17/2020   Preop cardiovascular exam 06/17/2020   Paroxysmal atrial fibrillation ([place]) 11/13/2017   Hypertensive heart disease without CHF 11/13/2017   Chronic kidney disease (CKD), stage III (moderate) ([place]) 11/13/2017   Hypothyroidism 11/13/2017   Prostate cancer ([place]) 11/13/2017   CAD (coronary artery disease), native coronary artery 11/13/2017   Myasthenia gravis ([place]) 01/07/2015    Sigurd Sos, PT 12/07/20 10:15 AM   [facility] @ [place], [place], [address] Phone: [phone]   Fax:  [phone]  Name: Eugene Castaneda MRN: [id number] Date of Birth: 1945/04/14

## 2020-12-09 ENCOUNTER — Other Ambulatory Visit: Payer: Self-pay

## 2020-12-09 ENCOUNTER — Ambulatory Visit: Payer: Medicare Other | Admitting: Physical Therapy

## 2020-12-09 ENCOUNTER — Encounter: Payer: Self-pay | Admitting: Physical Therapy

## 2020-12-09 DIAGNOSIS — M62830 Muscle spasm of back: Secondary | ICD-10-CM | POA: Diagnosis not present

## 2020-12-09 DIAGNOSIS — M6281 Muscle weakness (generalized): Secondary | ICD-10-CM

## 2020-12-09 DIAGNOSIS — M545 Low back pain, unspecified: Secondary | ICD-10-CM

## 2020-12-09 NOTE — Therapy (Signed)
[facility] @ [address] [place], [place], [address] Phone: [phone]   Fax:  [phone]  Physical Therapy Treatment  Patient Details  Name: Eugene Castaneda MRN: [id number] Date of Birth: 1945/08/24 Referring Provider (PT): Dr. Jean Rosenthal   Encounter Date: 12/09/2020   PT End of Session - 12/09/20 0835     Visit Number 11    Date for PT Re-Evaluation 01/13/21    Authorization Type Medicare    Authorization - Visit Number 11    Progress Note Due on Visit 22    PT Start Time 2725    PT Stop Time 0920    PT Time Calculation (min) 45 min    Activity Tolerance Patient tolerated treatment well    Behavior During Therapy [facility] for tasks assessed/performed             Past Medical History:  Diagnosis Date   CAD (coronary artery disease), native coronary artery 11/13/2017   Coronary calcification noted on CT scan    Chronic kidney disease (CKD), stage III (moderate) ([place]) 11/13/2017   Diplopia    Foley catheter present    pt has supra-pubic foley catheter in place/since 2016   GERD (gastroesophageal reflux disease)    Hyperlipemia    Hypertension    Hypertensive heart disease without CHF 11/13/2017   Hypothyroidism    Impaired fasting glucose    MG (myasthenia gravis) ([place])    Paroxysmal atrial fibrillation ([place]) 11/13/2017   RF ablation Dr. 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   Subjective Assessment - 12/09/20 0841     Subjective Doing good this AM. No pain currently.    Diagnostic tests MRI: lumbar stenosis    Currently in Pain? No/denies             Treatment: Patient seen for aquatic therapy today.  Treatment took place in water 2.5-4 feet deep depending upon activity.  Pt entered the pool via stairs, mild use of rails. Water temp 94 degrees F. Pt requires buoyancy of water for support and to offload joints with strengthening exercises.  Pt utilizes viscosity of the water required for strengthening.   Standing in 75% depth: Water walking with large noodle for push & pull 10x each direction. Lumbar flexion stretching at pool wall 3x 20 sec, added RT QL stretch 3x20 sec. Core compressions with neck pillow standing against wall for spine position input: 5 sec hold 10x: VC to not stoop forward while doing exercise. Knee pulls via core with medium noodle for postural contraction. 20x, Pt had difficulty keeping balance. Hamstring stretching at second step Bil  with ankle PF/DF 10x: stretch 30 sec 2x.                             PT Short Term Goals - 11/04/20 1114       PT SHORT TERM GOAL #1   Title Patient is indepedent with initial HEP for flexibility    Status On-going               PT Long Term Goals - 12/07/20 0940       PT LONG TERM GOAL #1   Title Patient independent with advanced HEP for core stabilization and aquatic program he can do in the pool    Status On-going      PT LONG TERM GOAL #2   Title Patient is able to sit in a soft chair with minimal pain due to improved lumbar flexibilty    Baseline I try to avoid this     Status On-going      PT LONG TERM GOAL #3   Title Patient is able to bend forward with minimal pain due to improved trunk flexion and mobility of the lumbar tissue    Status Achieved      PT LONG TERM GOAL #4   Title FOTO socre >/= 75 due to less pain, increased mobility, and correct body mechanics    Baseline 70    Status On-going                   Plan - 12/09/20 0837     Clinical Impression Statement Pt reports success in managing Rt low back pain. Had some pain yesterday, was able to abolish with exercises. Pt presents to aquatic PT with no pain this AM and plans to swim laps post session. Aquatic focused on hip mobility and strength as well as core strength. No pain when exercising in the water.    Personal Factors and Comorbidities Comorbidity 3+;Fitness    Comorbidities Prostate cancer; Myasthenia Gravis; CAD; CKD; Hypothyroidism; Hypertensive heart disease    Examination-Activity Limitations Sit;Bend    Stability/Clinical Decision Making Evolving/Moderate complexity    Rehab Potential Excellent    PT Frequency 2x / week    PT Duration 8 weeks    PT Treatment/Interventions Aquatic Therapy;Neuromuscular re-education;Therapeutic exercise;Therapeutic activities;Patient/family education;Manual techniques;Passive range of motion;Dry needling;Spinal Manipulations;Joint Manipulations    PT Next Visit Plan continue flexibility and strength, aquatics    PT Home Exercise Plan Access Code: [email or web address]    Consulted and Agree with Plan of Care Patient             Patient will benefit from skilled therapeutic intervention in order to improve the following deficits and impairments:  Decreased range of motion, Increased fascial restricitons, Pain, Decreased activity tolerance, Decreased strength, Decreased mobility  Visit Diagnosis: Muscle spasm of back  Acute right-sided low back pain without sciatica  Muscle weakness (generalized)     Problem List Patient Active  Problem List   Diagnosis Date Noted   Coronary artery calcification seen on CT scan 06/17/2020   Essential hypertension 06/17/2020   Mixed hyperlipidemia 06/17/2020   Preop cardiovascular exam 06/17/2020   Paroxysmal atrial fibrillation ([place]) 11/13/2017   Hypertensive heart disease without CHF 11/13/2017   Chronic kidney disease (CKD), stage III (moderate) ([place]) 11/13/2017   Hypothyroidism 11/13/2017   Prostate cancer ([place]) 11/13/2017   CAD (coronary artery disease), native coronary artery 11/13/2017   Myasthenia gravis ([place]) 01/07/2015  Pierre Cumpton, PTA 12/09/2020, 9:25 AM  [employer] @ [place], [place], [address] Phone: [phone]   Fax:  [phone]  Name: EMRIK ERHARD MRN: [id number] Date of Birth: 02/12/1945

## 2020-12-10 ENCOUNTER — Other Ambulatory Visit: Payer: Self-pay | Admitting: Internal Medicine

## 2020-12-10 NOTE — Telephone Encounter (Signed)
This is Dr. Christopher's pt 

## 2020-12-14 ENCOUNTER — Encounter (HOSPITAL_BASED_OUTPATIENT_CLINIC_OR_DEPARTMENT_OTHER): Payer: Self-pay | Admitting: Cardiology

## 2020-12-14 ENCOUNTER — Other Ambulatory Visit: Payer: Self-pay

## 2020-12-14 ENCOUNTER — Encounter: Payer: Self-pay | Admitting: Physical Therapy

## 2020-12-14 ENCOUNTER — Ambulatory Visit (INDEPENDENT_AMBULATORY_CARE_PROVIDER_SITE_OTHER): Payer: Medicare Other | Admitting: Cardiology

## 2020-12-14 ENCOUNTER — Ambulatory Visit: Payer: Medicare Other | Admitting: Physical Therapy

## 2020-12-14 VITALS — BP 130/62 | HR 50 | Ht 71.0 in | Wt 194.4 lb

## 2020-12-14 DIAGNOSIS — M6281 Muscle weakness (generalized): Secondary | ICD-10-CM

## 2020-12-14 DIAGNOSIS — M62830 Muscle spasm of back: Secondary | ICD-10-CM | POA: Diagnosis not present

## 2020-12-14 DIAGNOSIS — D6869 Other thrombophilia: Secondary | ICD-10-CM

## 2020-12-14 DIAGNOSIS — I2510 Atherosclerotic heart disease of native coronary artery without angina pectoris: Secondary | ICD-10-CM

## 2020-12-14 DIAGNOSIS — I10 Essential (primary) hypertension: Secondary | ICD-10-CM | POA: Diagnosis not present

## 2020-12-14 DIAGNOSIS — I480 Paroxysmal atrial fibrillation: Secondary | ICD-10-CM

## 2020-12-14 DIAGNOSIS — M545 Low back pain, unspecified: Secondary | ICD-10-CM

## 2020-12-14 DIAGNOSIS — E782 Mixed hyperlipidemia: Secondary | ICD-10-CM | POA: Diagnosis not present

## 2020-12-14 NOTE — Patient Instructions (Signed)

## 2020-12-14 NOTE — Progress Notes (Signed)
Cardiology Office Note:    Date:  12/14/2020   ID:  Eugene Castaneda, DOB 09/25/1945, MRN [id number]  PCP:  [name], MD  Cardiologist:  Buford Dresser, MD   Referring MD: [name], MD   CC: Follow up  History of Present Illness:    Eugene Castaneda is a 75 y.o. male with a hx of paroxysmal atrial fibrillation, hypertension, CAD without angina, sick sinus syndrome, hypothyroidism, hyperlipidemia who is seen for follow up. I initially saw him 11/27/2017  as a new patient to [facility] (previously followed by [name]) at the request of [name], [name], MD for the evaluation and management of paroxysmal afib.   Cardiac history: paroxysmal afib, on flecainide "for many years," reports prior treadmill tests that I do not have access to. Has declined anticoagulation in the past but was amenable to starting 01/2019. Uses [employer] intermittently to evaluate for afib.  Today: Overall he is feeling good. Generally, his blood pressure is averaging in the 130s/60-70s at home. At times his readings have been as low as 110/60, but he denies being symptomatic or being able to tell if his blood pressure is too low.  At one time he went off of his flecainide, but developed subsequent issues. Now he is back on 50 mg flecainide twice daily, which seems to be managing his palpitations. Occasionally he has an episode of flutters that last for a few minutes at most.  He denies any chest pain, or shortness of breath. No lightheadedness, headaches, syncope, orthopnea, PND, lower extremity edema or exertional symptoms. He denies any hematuria or hematochezia   Past Medical History:  Diagnosis Date   CAD (coronary artery disease), native coronary artery 11/13/2017   Coronary calcification noted on CT scan    Chronic kidney disease (CKD), stage III (moderate) ([place]) 11/13/2017   Diplopia    Foley catheter present    pt has supra-pubic foley catheter in place/since 2016    GERD (gastroesophageal reflux disease)    Hyperlipemia    Hypertension    Hypertensive heart disease without CHF 11/13/2017   Hypothyroidism    Impaired fasting glucose    MG (myasthenia gravis) ([place])    Paroxysmal atrial fibrillation ([place]) 11/13/2017   RF ablation Dr. 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   has supra-pubic foley catheter put in 2016/sphincter removed in 2017 replaced with the catheter   [place] N/A 07/23/2019   Procedure: [place];  Surgeon: [name], MD;  Location: [place] CV LAB;  Service: Cardiovascular;  Laterality: N/A;   ATRIAL FIBRILLATION ABLATION N/A 02/06/2020   Procedure: ATRIAL FIBRILLATION ABLATION;  Surgeon: [name], MD;  Location: [place] CV LAB;  Service: Cardiovascular;  Laterality: N/A;   catheter ablationfor afib  2010   COLONOSCOPY     PROSTATECTOMY  2009   TONSILLECTOMY      Current Medications: Current Outpatient Medications on File Prior to Visit  Medication Sig   apixaban (ELIQUIS) 5 MG TABS tablet Take 1 tablet by mouth twice daily   azaTHIOprine (IMURAN) 50 MG tablet Take 1 tablets by mouth in morning, 2 at night   cholecalciferol (VITAMIN D3) 25 MCG (1000 UNIT) tablet Take 1,000 Units by mouth daily. 1 Tablet Daily   clotrimazole-betamethasone (LOTRISONE)  cream Apply 1 application topically 2 (two) times daily as needed (irritation).   CRANBERRY PO Take 1 tablet by mouth daily. 650 mg daily   Cyanocobalamin 5000 MCG CAPS Take 1 capsule by mouth daily.   ferrous sulfate 325 (65 FE) MG EC tablet Take 325 mg by mouth 2 (two) times daily.   Fish Oil OIL Take 1,000 mg by mouth daily.    flecainide (TAMBOCOR) 50 MG tablet Take 1 tablet by mouth twice daily   gluconic acid-citric  acid (RENACIDIN) irrigation Irrigate with 30 mLs as directed every other day.   Incontinence Supplies (CATHETER EXTENSION TUBING) MISC Inject 1 fluid ounce into the vein as needed. Use as directed   Leuprolide Acetate (LUPRON IJ) Inject as directed as needed (Prostate cancer).    levothyroxine (SYNTHROID, LEVOTHROID) 150 MCG tablet Take 150 mcg by mouth daily before breakfast.   lisinopril (PRINIVIL,ZESTRIL) 20 MG tablet Take 20 mg by mouth 2 (two) times a day.    mirabegron ER (MYRBETRIQ) 50 MG TB24 tablet Take 50 mg by mouth as needed.   Multiple Vitamin (MULTIVITAMIN) tablet Take 1 tablet by mouth daily.   nystatin ointment (MYCOSTATIN) Apply 1 application topically as needed.   omeprazole (PRILOSEC) 20 MG capsule Take 20 mg by mouth daily.    rosuvastatin (CRESTOR) 5 MG tablet Take 2.5 mg by mouth daily.   vitamin C (ASCORBIC ACID) 500 MG tablet Take 500 mg by mouth daily.   Water For Irrigation, Sterile (STERILE WATER FOR IRRIGATION) Irrigate with 1,000,000 mLs as directed as needed. Use as directed   No current facility-administered medications on file prior to visit.     Allergies:   Patient has no known allergies.   Social History   Tobacco Use   Smoking status: Former    Types: Cigarettes    Quit date: 02/01/1980    Years since quitting: 40.8   Smokeless tobacco: Never  Vaping Use   Vaping Use: Never used  Substance Use Topics   Alcohol use: Yes    Alcohol/week: 0.0 standard drinks    Comment: 2 glasses red wine daily   Drug use: No    Family History: The patient's family history includes Breast cancer in his mother; Parkinson's disease in his father. There is no history of Colon cancer, Esophageal cancer, or Rectal cancer.  ROS:   Please see the history of present illness.   (+) Palpitations Additional pertinent ROS otherwise unremarkable.   EKGs/Labs/Other Studies Reviewed:    The following studies were reviewed today:  Afib Ablation 02/06/2020: 1. Sinus rhythm  upon presentation.   2. Intracardiac echo reveals a moderate sized left atrium with four separate pulmonary veins without evidence of pulmonary vein stenosis. 3. All four pulmonary veins were quiescent from the prior ablations.  There was a minor amount of electrical activity along the posterior bottom portion of the right inferior pulmonary vein.   4. Right inferior pulmonary vein isolation achieved with ablation.  5. Additional left atrial ablation was performed with a standard box lesion created along the posterior wall of the left atrium 6.   No arrhythmias were induced during isuprel infusion.  No PACs, PVCs, atrial tachycardia, atrial flutter, or other arrhythmias were observed today. 7. CTI block confirmed from a prior ablation 8. No early apparent complications.  ETT 12/17/2019:  Blood pressure demonstrated a normal response to exercise. There was no ST segment deviation noted during stress.   ETT with fair exercise tolerance (6:43); no chest pain; normal blood  pressure response; no diagnostic ST changes; negative adequate exercise tolerance test.  Afib Ablation 07/23/2019: 1. Sinus rhythm upon presentation.   2. Intracardiac echo reveals a normal sized left atrium with four separate pulmonary veins without evidence of pulmonary vein stenosis. 3.  The left pulmonary veins were quiescent from the prior ablation and did not require additional ablation today.  There was minor return of electrical activity along the anterior carina between the right superior and inferior pulmonary veins which was successfully ablated today. 4. Ectopic atrial tachycardia successfully ablated along the anteroseptal right atrium 5. Isthmus dependant atrial flutter successfully ablated along the CTI with isthmus block achieved 6. Atrial fibrillation also observed during isuprel with multiple atypical atrial flutter circuits observed, not suitable for mapping or ablation today. 7. Successful cardioversion to  sinus rhythm 8. No early apparent complications.  Echo 07/03/2019: 1. Left ventricular ejection fraction, by estimation, is 60 to 65%. The  left ventricle has normal function. The left ventricle has no regional  wall motion abnormalities. Left ventricular diastolic parameters are  consistent with Grade I diastolic  dysfunction (impaired relaxation). The average left ventricular global  longitudinal strain is -19.6 %. The global longitudinal strain is normal.   2. Right ventricular systolic function is normal. The right ventricular  size is mildly enlarged. There is normal pulmonary artery systolic  pressure. The estimated right ventricular systolic pressure is 82.5 mmHg.   3. The mitral valve is normal in structure. Trivial mitral valve  regurgitation. No evidence of mitral stenosis.   4. The aortic valve is tricuspid. Aortic valve regurgitation is trivial.  Mild aortic valve sclerosis is present, with no evidence of aortic valve  stenosis. Aortic regurgitation PHT measures 1615 msec.   5. Aortic dilatation noted. There is mild dilatation of the ascending  aorta measuring 38 mm.   6. The inferior vena cava is normal in size with greater than 50%  respiratory variability, suggesting right atrial pressure of 3 mmHg.   Per Dr. Thurman Coyer notes: treadmill myoview, 04/18/2013.  He did 9 minutes, up to stage II Bruce, for a workload total of 10 METs. Heart rate went from 62 bpm to 144 bpm. No significant ST changes or arrhythmia noted. Imaging suggests a low risk study, with inferolateral ischemia vs more likely artifact, EF 78%.  Echo 10/12/2015 Mild cLVH with normal WM. EF 55% Moderate LAE  Mild RAE Trace AR Mild MR Trace TR and PR IVC dilated, <50% RC  RF ablation 03/2008 Treadmill 05/2003, 03/2008, 03/2013 Echo 03/2003  EKG:  EKG is personally reviewed. 12/14/2020: not ordered today 06/17/2020: Sinus rhythm, bradycardic, rate 58 bpm, iRBBB 10/23/19: sinus rhythm, 1st degree AV block,  incomplete RBBB at rate of 63 bpm.  Recent Labs: 01/20/2020: BUN 17; Creatinine, Ser 1.07; Potassium 4.9; Sodium 139 11/12/2020: Hemoglobin 13.1; Platelets 231.0  Recent Lipid Panel No results found for: CHOL, TRIG, HDL, CHOLHDL, VLDL, LDLCALC, LDLDIRECT   Physical Exam:    VS:  BP 130/62   Pulse (!) 50   Ht 5\' 11"  (1.803 m)   Wt 194 lb 6.4 oz (88.2 kg)   SpO2 97%   BMI 27.11 kg/m     Wt Readings from Last 3 Encounters:  12/14/20 194 lb 6.4 oz (88.2 kg)  11/17/20 191 lb 4 oz (86.8 kg)  10/22/20 179 lb (81.2 kg)    GEN: Well nourished, well developed in no acute distress HEENT: Normal, moist mucous membranes NECK: No JVD CARDIAC: regular rhythm, normal S1 and S2, no  rubs or gallops. No murmur. VASCULAR: Radial and DP pulses 2+ bilaterally. No carotid bruits RESPIRATORY:  Clear to auscultation without rales, wheezing or rhonchi  ABDOMEN: Soft, non-tender, non-distended MUSCULOSKELETAL:  Ambulates independently SKIN: Warm and dry, no edema NEUROLOGIC:  Alert and oriented x 3. No focal neuro deficits noted. PSYCHIATRIC:  Normal affect    ASSESSMENT:    1. Paroxysmal atrial fibrillation (HCC)   2. Essential hypertension   3. Coronary artery calcification seen on CT scan   4. Secondary hypercoagulable state ([place])   5. Mixed hyperlipidemia     PLAN:    Paroxysmal atrial fibrillation (HCC) -s/p ablation but had recurrent afib, restarted flecainide. We have discussed risk of flecainide with ischemia. Denies ischemic symptoms, discussed what to watch for  -CHA2DS2/VAS Stroke Risk Points = 5    -continue apixaban, denies any bleeding issues    Mixed hyperlipidemia Discussed in the context of coronary calcium. Last LDL 68, goal <70.  -continue rosuvastatin   CAD (coronary artery disease), native coronary artery Swims for >1 hour three times/week without limitations. No angina. On statin, LDL at goal. No aspirin. ETT reviewed.   Essential hypertension Well controlled,  continue lisinopril.  CV risk and prevention counseling: -recommend heart healthy/Mediterranean diet, with whole grains, fruits, vegetable, fish, lean meats, nuts, and olive oil. Limit salt. -recommend moderate walking, 3-5 times/week for 30-50 minutes each session. Aim for at least 150 minutes.week. Goal should be pace of 3 miles/hours, or walking 1.5 miles in 30 minutes -recommend avoidance of tobacco products. Avoid excess alcohol.  Plan for follow up: 6 mos or sooner with me as needed.   Buford Dresser, MD, PhD [place]  [facility]   Medication Adjustments/Labs and Tests Ordered: Current medicines are reviewed at length with the patient today.  Concerns regarding medicines are outlined above.  No orders of the defined types were placed in this encounter.  No orders of the defined types were placed in this encounter.   Patient Instructions  Medication Instructions:  Your Physician recommend you continue on your current medication as directed.    *If you need a refill on your cardiac medications before your next appointment, please call your pharmacy*   Lab Work: None ordered today   Testing/Procedures: None ordered today   Follow-Up: At [facility], you and your health needs are our priority.  As part of our continuing mission to provide you with exceptional heart care, we have created designated Provider Care Teams.  These Care Teams include your primary Cardiologist (physician) and Advanced Practice Providers (APPs -  Physician Assistants and Nurse Practitioners) who all work together to provide you with the care you need, when you need it.  We recommend signing up for the patient portal called "MyChart".  Sign up information is provided on this After Visit Summary.  MyChart is used to connect with patients for Virtual Visits (Telemedicine).  Patients are able to view lab/test results, encounter notes, upcoming appointments, etc.  Non-urgent messages can  be sent to your provider as well.   To learn more about what you can do with MyChart, go to [email or web address].    Your next appointment:   6 month(s)  The format for your next appointment:   In Person  Provider:   Buford Dresser, MD     [facility],acting as a scribe for Buford Dresser, MD.,have documented all relevant documentation on the behalf of Buford Dresser, MD,as directed by  Buford Dresser, MD while in the presence of Arelis Neumeier  Harrell Gave, MD.  I, Buford Dresser, MD, have reviewed all documentation for this visit. The documentation on 12/14/20 for the exam, diagnosis, procedures, and orders are all accurate and complete.  Signed, Buford Dresser, MD PhD 12/14/2020 8:21 AM    [place]

## 2020-12-14 NOTE — Therapy (Signed)
[facility] @ [place], [place], [address] Phone: [phone]   Fax:  [phone]  Physical Therapy Treatment  Patient Details  Name: Eugene Castaneda MRN: [id number] Date of Birth: [birth year] Referring Provider (PT): Dr. Jean Rosenthal   Encounter Date: 12/14/2020   PT End of Session - 12/14/20 1434     Visit Number 12    Date for PT Re-Evaluation 01/13/21    Authorization Type Medicare    Authorization - Visit Number 12    Progress Note Due on Visit 42    PT Start Time 1435    PT Stop Time 1520    PT Time Calculation (min) 45 min    Activity Tolerance Patient tolerated treatment well    Behavior During Therapy [facility] for tasks assessed/performed             Past Medical History:  Diagnosis Date   CAD (coronary artery disease), native coronary artery 11/13/2017   Coronary calcification noted on CT scan    Chronic kidney disease (CKD), stage III (moderate) ([place]) 11/13/2017   Diplopia    Foley catheter present    pt has supra-pubic foley catheter in place/since 2016   GERD (gastroesophageal reflux disease)    Hyperlipemia    Hypertension    Hypertensive heart disease without CHF 11/13/2017   Hypothyroidism    Impaired fasting glucose    MG (myasthenia gravis) ([place])    Paroxysmal atrial fibrillation ([place]) 11/13/2017   RF ablation Dr. 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   Subjective Assessment - 12/14/20 1436     Subjective Doing well. Both water PT and my own swimming have gone well. Still no pain episodes.    Currently in Pain? No/denies                               [facility] Adult PT Treatment/Exercise - 12/14/20 0001       Lumbar Exercises: Stretches   Active Hamstring Stretch Left;Right;30 seconds;3 reps    Active Hamstring Stretch Limitations supine with strap    Lower Trunk Rotation 3 reps;20 seconds    Piriformis Stretch 3 reps;30 seconds    Piriformis Stretch Limitations supine    Other Lumbar Stretch Exercise Standing RT QL stretch 3x 10 sec, RT LE behind LT      Lumbar Exercises: Aerobic   Recumbent Bike L2 x 10 min with  PTA present  to monitor.      Lumbar Exercises: Machines for Strengthening   Other Lumbar Machine Exercise Lat Pull 30# 10x  Lumbar Exercises: Standing   Other Standing Lumbar Exercises Pallof press: green  band 2x10 each      Lumbar Exercises: Seated   Sit to Stand 20 reps    Sit to Stand Limitations holding 10# kettle bell      Lumbar Exercises: Supine   Clam 20 reps    Clam Limitations red band                       PT Short Term Goals - 11/04/20 1114       PT SHORT TERM GOAL #1   Title Patient is indepedent with initial HEP for flexibility    Status On-going               PT Long Term Goals - 12/07/20 0940       PT LONG TERM GOAL #1   Title Patient independent with advanced HEP for core stabilization and aquatic program he can do in the pool    Status On-going      PT LONG TERM GOAL #2   Title Patient is able to sit in a soft chair with minimal pain due to  improved lumbar flexibilty    Baseline I try to avoid this    Status On-going      PT LONG TERM GOAL #3   Title Patient is able to bend forward with minimal pain due to improved trunk flexion and mobility of the lumbar tissue    Status Achieved      PT LONG TERM GOAL #4   Title FOTO socre >/= 75 due to less pain, increased mobility, and correct body mechanics    Baseline 70    Status On-going                   Plan - 12/14/20 1441     Clinical Impression Statement Pt arrives pain free, has been pain free last 5 days. Pt doing well with all his aquatic exercise. Bil hips remains limited in ext rotation and hamstrings. Pt able to carry 5# plates in each hand around the gym 2x with some mild tightening to RT QL. Stretching at the end helped reduce the tightness.    Personal Factors and Comorbidities Comorbidity 3+;Fitness    Comorbidities Prostate cancer; Myasthenia Gravis; CAD; CKD; Hypothyroidism; Hypertensive heart disease    Examination-Activity Limitations Sit;Bend    Stability/Clinical Decision Making Evolving/Moderate complexity    Rehab Potential Excellent    PT Frequency 2x / week    PT Duration 8 weeks    PT Treatment/Interventions Aquatic Therapy;Neuromuscular re-education;Therapeutic exercise;Therapeutic activities;Patient/family education;Manual techniques;Passive range of motion;Dry needling;Spinal Manipulations;Joint Manipulations    PT Next Visit Plan continue flexibility and strength, aquatics next    PT Home Exercise Plan Access Code: [email or web address]    Consulted and Agree with Plan of Care Patient             Patient will benefit from skilled therapeutic intervention in order to improve the following deficits and impairments:  Decreased range of motion, Increased fascial restricitons, Pain, Decreased activity tolerance, Decreased strength, Decreased mobility  Visit Diagnosis: Muscle spasm of back  Acute right-sided low back pain without sciatica  Muscle  weakness (generalized)     Problem List Patient Active Problem List   Diagnosis Date Noted   Coronary artery calcification seen on CT scan 06/17/2020   Essential hypertension 06/17/2020   Mixed hyperlipidemia 06/17/2020   Preop cardiovascular exam 06/17/2020   Paroxysmal atrial fibrillation (  [place]) 11/13/2017   Hypertensive heart disease without CHF 11/13/2017   Chronic kidney disease (CKD), stage III (moderate) ([place]) 11/13/2017   Hypothyroidism 11/13/2017   Prostate cancer ([place]) 11/13/2017   CAD (coronary artery disease), native coronary artery 11/13/2017   Myasthenia gravis ([place]) 01/07/2015    Wael Maestas, PTA 12/14/2020, 3:22 PM  [facility] @ [place], [place], [address] Phone: [phone]   Fax:  [phone]  Name: KAVIAN PETERS MRN: [id number] Date of Birth: 1945/07/04

## 2020-12-16 ENCOUNTER — Other Ambulatory Visit: Payer: Self-pay

## 2020-12-16 ENCOUNTER — Encounter: Payer: Self-pay | Admitting: Physical Therapy

## 2020-12-16 ENCOUNTER — Ambulatory Visit: Payer: Medicare Other | Admitting: Physical Therapy

## 2020-12-16 DIAGNOSIS — M62830 Muscle spasm of back: Secondary | ICD-10-CM

## 2020-12-16 DIAGNOSIS — M545 Low back pain, unspecified: Secondary | ICD-10-CM | POA: Diagnosis not present

## 2020-12-16 DIAGNOSIS — M6281 Muscle weakness (generalized): Secondary | ICD-10-CM | POA: Diagnosis not present

## 2020-12-16 NOTE — Therapy (Signed)
[facility] @ [place], [place], [address] Phone: [phone]   Fax:  [phone]  Physical Therapy Treatment  Patient Details  Name: Eugene Castaneda MRN: [id number] Date of Birth: 05/30/1945 Referring Provider (PT): Dr. Jean Rosenthal   Encounter Date: 12/16/2020   PT End of Session - 12/16/20 0845     Visit Number 13    Date for PT Re-Evaluation 01/13/21    Authorization Type Medicare    Authorization - Visit Number 13    Progress Note Due on Visit 20    PT Start Time 3893    PT Stop Time 0927    PT Time Calculation (min) 43 min    Activity Tolerance Patient tolerated treatment well    Behavior During Therapy [facility] for tasks assessed/performed             Past Medical History:  Diagnosis Date   CAD (coronary artery disease), native coronary artery 11/13/2017   Coronary calcification noted on CT scan    Chronic kidney disease (CKD), stage III (moderate) ([place]) 11/13/2017   Diplopia    Foley catheter present    pt has supra-pubic foley catheter in place/since 2016   GERD (gastroesophageal reflux disease)    Hyperlipemia    Hypertension    Hypertensive heart disease without CHF 11/13/2017   Hypothyroidism    Impaired fasting glucose    MG (myasthenia gravis) ([place])    Paroxysmal atrial fibrillation ([place]) 11/13/2017   RF ablation Dr. 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   Subjective Assessment - 12/16/20 1111     Subjective No pain, no complaints.    Diagnostic tests MRI: lumbar stenosis    Currently in Pain? No/denies            Patient seen for aquatic therapy today.  Treatment took place in water 2.5-4 feet deep depending upon activity.  Pt entered the pool via stairs, step over step with mild use of the rails. Water temp 95 degrees F. Pt requires buoyancy of water for support and to offload joints with strengthening exercises.    75% submersion: 8 lengths of water walking forward, f/b lumbar stretches and Rt QL stretches at side of pool where pt can hold on. PTA giving TC at back ribs to encourage back breathing. Repeat sequence for backwards walking and side stepping. Back against the wall, post tilt slight with lower abdominal compressions 5 sec hold 10x, static knee lifts focusing on core 10x, then march across the pool 6x with core focus 6x. Small noodle for additional core contraction. Seated decompression float intermittent with large  noodle. Standing hamstring stretch Bil with ankle DF/PF 2 sets.                               PT Short Term Goals - 11/04/20 1114       PT SHORT TERM GOAL #1   Title Patient is indepedent with initial HEP for flexibility    Status On-going               PT Long Term Goals - 12/07/20 0940       PT LONG TERM GOAL #1   Title Patient independent with advanced HEP for core stabilization and aquatic program he can do in the pool    Status On-going      PT LONG TERM GOAL #2   Title Patient is able to sit in a soft chair with minimal pain due to improved lumbar flexibilty    Baseline I  try to avoid this    Status On-going      PT LONG TERM GOAL #3   Title Patient is able to bend forward with minimal pain due to improved trunk flexion and mobility of the lumbar tissue    Status Achieved      PT LONG TERM GOAL #4   Title FOTO socre >/= 75 due to less pain, increased mobility, and correct body mechanics    Baseline 70    Status On-going                   Plan - 12/16/20 1111     Clinical Impression Statement Pt contiues with no pain, compliant with HEP and continues to swim post PT ( 3 days week). Aqautics focused on combination of deep water stretching with intermittent decompression floats. Hamstrings tight, no pain throughout or at conculsion of treatment.    Personal Factors and Comorbidities Comorbidity 3+;Fitness    Comorbidities Prostate cancer; Myasthenia Gravis; CAD; CKD; Hypothyroidism; Hypertensive heart disease    Examination-Activity Limitations Sit;Bend    Stability/Clinical Decision Making Evolving/Moderate complexity    Rehab Potential Excellent    PT Frequency 2x / week    PT Duration 8 weeks    PT Treatment/Interventions Aquatic Therapy;Neuromuscular re-education;Therapeutic exercise;Therapeutic activities;Patient/family education;Manual techniques;Passive range of motion;Dry needling;Spinal Manipulations;Joint Manipulations    PT Next Visit Plan continue flexibility and strength, aquatics    PT Home Exercise Plan Access Code: [email or web address]    Consulted and Agree with Plan of Care Patient             Patient will benefit from skilled therapeutic intervention in order to improve the following deficits and impairments:  Decreased range of motion, Increased fascial restricitons, Pain, Decreased activity tolerance, Decreased strength, Decreased mobility  Visit Diagnosis: Muscle spasm of back  Acute right-sided low back pain without sciatica  Muscle weakness (generalized)     Problem List Patient Active Problem List   Diagnosis Date  Noted   Coronary artery calcification seen on CT scan 06/17/2020   Essential hypertension 06/17/2020   Mixed hyperlipidemia 06/17/2020   Preop cardiovascular exam 06/17/2020   Paroxysmal atrial fibrillation ([place]) 11/13/2017   Hypertensive heart disease without CHF 11/13/2017   Chronic kidney disease (CKD), stage III (moderate) ([place]) 11/13/2017   Hypothyroidism 11/13/2017   Prostate cancer ([place]) 11/13/2017   CAD (coronary artery disease), native coronary artery 11/13/2017   Myasthenia gravis ([place]) 01/07/2015    Malan Werk, PTA 12/16/2020, 11:14 AM  [facility] @ [place], [place], [address] Phone: [phone]   Fax:  [phone]  Name: OSIAS RESNICK MRN: [id number] Date of Birth: [birth year]

## 2020-12-30 ENCOUNTER — Other Ambulatory Visit: Payer: Self-pay

## 2020-12-30 ENCOUNTER — Ambulatory Visit: Payer: Medicare Other

## 2020-12-30 DIAGNOSIS — M545 Low back pain, unspecified: Secondary | ICD-10-CM | POA: Diagnosis not present

## 2020-12-30 DIAGNOSIS — M6281 Muscle weakness (generalized): Secondary | ICD-10-CM | POA: Diagnosis not present

## 2020-12-30 DIAGNOSIS — R339 Retention of urine, unspecified: Secondary | ICD-10-CM | POA: Diagnosis not present

## 2020-12-30 DIAGNOSIS — Z466 Encounter for fitting and adjustment of urinary device: Secondary | ICD-10-CM | POA: Diagnosis not present

## 2020-12-30 DIAGNOSIS — M62830 Muscle spasm of back: Secondary | ICD-10-CM

## 2020-12-30 NOTE — Therapy (Signed)
[facility] @ [place], [place], [address] Phone: [phone]   Fax:  [phone]  Physical Therapy Treatment  Patient Details  Name: Eugene Castaneda MRN: [id number] Date of Birth: 05/27/1945 Referring Provider (PT): Dr. Jean Rosenthal   Encounter Date: 12/30/2020   PT End of Session - 12/30/20 1523     Visit Number 14    Date for PT Re-Evaluation 01/13/21    Authorization Type Medicare    Authorization - Visit Number 14    Progress Note Due on Visit 20    PT Start Time 1446    PT Stop Time 0109    PT Time Calculation (min) 38 min    Activity Tolerance Patient tolerated treatment well    Behavior During Therapy [facility] for tasks assessed/performed             Past Medical History:  Diagnosis Date   CAD (coronary artery disease), native coronary artery 11/13/2017   Coronary calcification noted on CT scan    Chronic kidney disease (CKD), stage III (moderate) ([place]) 11/13/2017   Diplopia    Foley catheter present    pt has supra-pubic foley catheter in place/since 2016   GERD (gastroesophageal reflux disease)    Hyperlipemia    Hypertension    Hypertensive heart disease without CHF 11/13/2017   Hypothyroidism    Impaired fasting glucose    MG (myasthenia gravis) ([place])    Paroxysmal atrial fibrillation ([place]) 11/13/2017   RF ablation Dr. 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   Subjective Assessment - 12/30/20 1443     Subjective I am doing great.  PT is really helping me.    Currently in Pain? No/denies                               [facility] Adult PT Treatment/Exercise - 12/30/20 0001       Lumbar Exercises: Stretches   Active Hamstring Stretch Left;Right;30 seconds;3 reps    Active Hamstring Stretch Limitations supine with strap    Lower Trunk Rotation 3 reps;20 seconds      Lumbar Exercises: Aerobic   UBE (Upper Arm Bike) L2 x 10 min with PT present to discuss status      Lumbar Exercises: Machines for Strengthening   Other Lumbar Machine Exercise Lat Pull 30# 2x10      Lumbar Exercises: Standing   Other Standing Lumbar Exercises Pallof press: green  band 2x10 each      Lumbar Exercises: Seated   Sit to Stand 20 reps  Sit to Stand Limitations holding 10# kettle bell      Lumbar Exercises: Supine   Clam 20 reps    Clam Limitations red band                       PT Short Term Goals - 11/04/20 1114       PT SHORT TERM GOAL #1   Title Patient is indepedent with initial HEP for flexibility    Status On-going               PT Long Term Goals - 12/07/20 0940       PT LONG TERM GOAL #1   Title Patient independent with advanced HEP for core stabilization and aquatic program he can do in the pool    Status On-going      PT LONG TERM GOAL #2   Title Patient is able to sit in a soft chair with minimal pain due to improved lumbar flexibilty    Baseline I try to avoid this    Status On-going      PT LONG TERM GOAL #3   Title Patient is able to bend forward with minimal pain due to improved trunk flexion and  mobility of the lumbar tissue    Status Achieved      PT LONG TERM GOAL #4   Title FOTO socre >/= 75 due to less pain, increased mobility, and correct body mechanics    Baseline 70    Status On-going                   Plan - 12/30/20 1459     Clinical Impression Statement Pt continues with no pain, compliant with HEP and continues to swim post PT ( 3 days week). Pt had a 2 week lapse in treatment and has maintained current status without any increase in pain.  Pt with continued lumbopelvic strength deficits that are improving with exercise. Pt tolerated all exercise in the clinic and was monitored by PT for safety and verbal cues provided for technique.  Pt will attend 2 more weeks of PT for land and aquatic PT to address strength and endurance.    PT Frequency 2x / week    PT Duration 8 weeks    PT Treatment/Interventions Aquatic Therapy;Neuromuscular re-education;Therapeutic exercise;Therapeutic activities;Patient/family education;Manual techniques;Passive range of motion;Dry needling;Spinal Manipulations;Joint Manipulations    PT Next Visit Plan continue flexibility and strength, aquatics-2 more weeks    PT Home Exercise Plan Access Code: [email or web address]    Consulted and Agree with Plan of Care Patient             Patient will benefit from skilled therapeutic intervention in order to improve the following deficits and impairments:  Decreased range of motion, Increased fascial restricitons, Pain, Decreased activity tolerance, Decreased strength, Decreased mobility  Visit Diagnosis: Muscle spasm of back  Acute right-sided low back pain without sciatica  Muscle weakness (generalized)     Problem List Patient Active Problem List   Diagnosis Date Noted   Coronary artery calcification seen on CT scan 06/17/2020   Essential hypertension 06/17/2020   Mixed hyperlipidemia 06/17/2020   Preop cardiovascular exam 06/17/2020   Paroxysmal atrial fibrillation ([place]) 11/13/2017    Hypertensive heart disease without CHF 11/13/2017   Chronic kidney disease (CKD), stage III (moderate) ([place]) 11/13/2017   Hypothyroidism 11/13/2017   Prostate cancer ([place]) 11/13/2017   CAD (coronary artery disease), native coronary artery 11/13/2017   Myasthenia gravis (  [place]) 01/07/2015   Sigurd Sos, PT 12/30/20 3:28 PM   [facility] @ [place], [place], [address] Phone: [phone]   Fax:  [phone]  Name: Eugene Castaneda MRN: [id number] Date of Birth: 27-May-1945

## 2021-01-04 ENCOUNTER — Ambulatory Visit: Payer: Medicare Other | Attending: Orthopaedic Surgery

## 2021-01-04 ENCOUNTER — Other Ambulatory Visit: Payer: Self-pay

## 2021-01-04 DIAGNOSIS — M62830 Muscle spasm of back: Secondary | ICD-10-CM | POA: Diagnosis not present

## 2021-01-04 DIAGNOSIS — M545 Low back pain, unspecified: Secondary | ICD-10-CM | POA: Insufficient documentation

## 2021-01-04 DIAGNOSIS — M6281 Muscle weakness (generalized): Secondary | ICD-10-CM | POA: Diagnosis not present

## 2021-01-04 NOTE — Therapy (Signed)
[facility] @ [address] [place] [place], [place], [address] Phone: [phone]   Fax:  [phone]  Physical Therapy Treatment  Patient Details  Name: Eugene Castaneda MRN: [id number] Date of Birth: 1945-10-19 Referring Provider (PT): Dr. Jean Rosenthal   Encounter Date: 01/04/2021   PT End of Session - 01/04/21 1015     Visit Number 15    Date for PT Re-Evaluation 01/13/21    Authorization Type Medicare    Progress Note Due on Visit 20    PT Start Time 0930    PT Stop Time 1011    PT Time Calculation (min) 41 min    Activity Tolerance Patient tolerated treatment well    Behavior During Therapy [facility] for tasks assessed/performed             Past Medical History:  Diagnosis Date   CAD (coronary artery disease), native coronary artery 11/13/2017   Coronary calcification noted on CT scan    Chronic kidney disease (CKD), stage III (moderate) ([place]) 11/13/2017   Diplopia    Foley catheter present    pt has supra-pubic foley catheter in place/since 2016   GERD (gastroesophageal reflux disease)    Hyperlipemia    Hypertension    Hypertensive heart disease without CHF 11/13/2017   Hypothyroidism    Impaired fasting glucose    MG (myasthenia gravis) ([place])    Paroxysmal atrial fibrillation ([place]) 11/13/2017   RF ablation Dr. 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   Subjective Assessment - 01/04/21 0948     Subjective I am still doing great.  I'm getting stronger .    Currently in Pain? No/denies                               [facility] Adult PT Treatment/Exercise - 01/04/21 0001       Lumbar Exercises: Stretches   Active Hamstring Stretch Left;Right;30 seconds;3 reps    Active Hamstring Stretch Limitations supine with strap    Lower Trunk Rotation 3 reps;20 seconds    Piriformis Stretch 3 reps;30 seconds    Piriformis Stretch Limitations supine      Lumbar Exercises: Standing   Other Standing Lumbar Exercises Pallof press: green  band 2x10 each      Lumbar Exercises: Seated   Sit to Stand 20 reps    Sit to Stand Limitations holding 10# kettle bell      Lumbar Exercises: Supine   Clam 20 reps    Clam Limitations red band  PT Short Term Goals - 11/04/20 1114       PT SHORT TERM GOAL #1   Title Patient is indepedent with initial HEP for flexibility    Status On-going               PT Long Term Goals - 01/04/21 0949       PT LONG TERM GOAL #1   Title Patient independent with advanced HEP for core stabilization and aquatic program he can do in the pool    Status On-going      PT LONG TERM GOAL #2   Title Patient is able to sit in a soft chair with minimal pain due to improved lumbar flexibilty    Baseline pt avoids soft chairs    Status Deferred      PT LONG TERM GOAL #3   Title Patient is able to bend forward with minimal pain due to improved trunk flexion and mobility of the lumbar tissue    Status On-going                   Plan - 01/04/21 0937     Clinical Impression Statement Pt continues  with no pain, compliant with HEP and continues to swim post PT (3 days week). Pt reports that he is feeling stronger and more endurance without pain. Pt with continued lumbopelvic strength deficits that are improving with exercise. Pt tolerated all exercise in the clinic and was monitored by PT for safety and verbal cues provided for technique.  Pt will attend 3 more sessions of PT for land and aquatic PT to address strength and endurance.    PT Frequency 2x / week    PT Duration 8 weeks    PT Treatment/Interventions Aquatic Therapy;Neuromuscular re-education;Therapeutic exercise;Therapeutic activities;Patient/family education;Manual techniques;Passive range of motion;Dry needling;Spinal Manipulations;Joint Manipulations    PT Next Visit Plan continue flexibility and strength, aquatics-2 more weeks    PT Home Exercise Plan Access Code: [email or web address]    Consulted and Agree with Plan of Care Patient             Patient will benefit from skilled therapeutic intervention in order to improve the following deficits and impairments:  Decreased range of motion, Increased fascial restricitons, Pain, Decreased activity tolerance, Decreased strength, Decreased mobility  Visit Diagnosis: Muscle spasm of back  Acute right-sided low back pain without sciatica  Muscle weakness (generalized)     Problem List Patient Active Problem List   Diagnosis Date Noted   Coronary artery calcification seen on CT scan 06/17/2020   Essential hypertension 06/17/2020   Mixed hyperlipidemia 06/17/2020   Preop cardiovascular exam 06/17/2020   Paroxysmal atrial fibrillation ([place]) 11/13/2017   Hypertensive heart disease without CHF 11/13/2017   Chronic kidney disease (CKD), stage III (moderate) ([place]) 11/13/2017   Hypothyroidism 11/13/2017   Prostate cancer ([place]) 11/13/2017   CAD (coronary artery disease), native coronary artery 11/13/2017   Myasthenia gravis ([place]) 01/07/2015    Sigurd Sos, PT 01/04/21 10:16 AM    [facility] @ [address] [place], [place], [address] Phone: [phone]   Fax:  [phone]  Name: Eugene Castaneda MRN: [id number] Date of Birth: Dec 09, 1945

## 2021-01-06 ENCOUNTER — Other Ambulatory Visit: Payer: Self-pay

## 2021-01-06 ENCOUNTER — Encounter: Payer: Self-pay | Admitting: Physical Therapy

## 2021-01-06 ENCOUNTER — Ambulatory Visit: Payer: Medicare Other | Admitting: Physical Therapy

## 2021-01-06 DIAGNOSIS — M62830 Muscle spasm of back: Secondary | ICD-10-CM

## 2021-01-06 DIAGNOSIS — M6281 Muscle weakness (generalized): Secondary | ICD-10-CM

## 2021-01-06 DIAGNOSIS — M545 Low back pain, unspecified: Secondary | ICD-10-CM | POA: Diagnosis not present

## 2021-01-06 NOTE — Therapy (Signed)
[facility] @ [place], [place], [address] Phone: [phone]   Fax:  [phone]  Physical Therapy Treatment  Patient Details  Name: Eugene Castaneda MRN: [id number] Date of Birth: [birth year] Referring Provider (PT): Dr. Jean Rosenthal   Encounter Date: 01/06/2021   PT End of Session - 01/06/21 0831     Visit Number 16    Date for PT Re-Evaluation 01/13/21    Authorization Type Medicare    Progress Note Due on Visit 72    PT Start Time 0831    PT Stop Time 0918    PT Time Calculation (min) 47 min    Activity Tolerance Patient tolerated treatment well    Behavior During Therapy [facility] for tasks assessed/performed             Past Medical History:  Diagnosis Date   CAD (coronary artery disease), native coronary artery 11/13/2017   Coronary calcification noted on CT scan    Chronic kidney disease (CKD), stage III (moderate) ([place]) 11/13/2017   Diplopia    Foley catheter present    pt has supra-pubic foley catheter in place/since 2016   GERD (gastroesophageal reflux disease)    Hyperlipemia    Hypertension    Hypertensive heart disease without CHF 11/13/2017   Hypothyroidism    Impaired fasting glucose    MG (myasthenia gravis) ([place])    Paroxysmal atrial fibrillation ([place]) 11/13/2017   RF ablation Dr. 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   Subjective Assessment - 01/06/21 0835     Subjective No complaints or pain this AM    Currently in Pain? No/denies    Multiple Pain Sites No            Treatment: Patient seen for aquatic therapy today.  Treatment took place in water 2.5-4 feet deep depending upon activity.  Pt entered the pool via steps, reciprocal with mild use of rails. Water temp 94 degrees F. Pt requires buoyancy of water for support and to offload joints with strengthening exercises.  Pt utilizes viscosity of the water required for strengthening.   Standing 75% submersion: water walking with UE small noodle push/pull for posture 10x in each direction: mitten hands on side stepping. Childs pose stretching central and rotationBil 3x 4 breaths with PTA TC on back ribs, intermittent efflurage to RT QL. Hip circumduction 10x each direction Bil.  Core compresisons against wall  with neck pillow 5 sec 10x, added marching across pool 4 lengths with small noodle for postural cues.   Hamstring stretch on second step using rails: Bil 3x 30 sec with ankle  DF/PF 10x post 30 sec. Review of decompression hang if he needs to utilize later for pain management.                               PT Short Term Goals - 11/04/20 1114       PT SHORT TERM GOAL #1   Title Patient is indepedent with initial HEP for flexibility    Status On-going               PT Long Term Goals - 01/04/21 0949       PT LONG TERM GOAL #1   Title Patient independent with advanced HEP for core stabilization and aquatic program he can do in the pool    Status On-going      PT LONG TERM GOAL #2   Title Patient is able to sit in a soft chair with minimal  pain due to improved lumbar flexibilty    Baseline pt avoids soft chairs    Status Deferred      PT LONG TERM GOAL #3   Title Patient is able to bend forward with minimal pain due to improved trunk flexion and mobility of the lumbar tissue    Status On-going                   Plan - 01/06/21 6962     Clinical Impression Statement Pt arrives today for aquatic PT session with no new complaints. Pt can complete full aquatic workout including ROM, stretching, core & hip strength with no pain. Pt reports good understanding of exercises and concepts that go along with them,    Personal Factors and Comorbidities Comorbidity 3+;Fitness    Comorbidities Prostate cancer; Myasthenia Gravis; CAD; CKD; Hypothyroidism; Hypertensive heart disease    Examination-Activity Limitations Sit;Bend    Stability/Clinical Decision Making Evolving/Moderate complexity    Rehab Potential Excellent    PT Frequency 2x / week    PT Treatment/Interventions Aquatic Therapy;Neuromuscular re-education;Therapeutic exercise;Therapeutic activities;Patient/family education;Manual techniques;Passive range of motion;Dry needling;Spinal Manipulations;Joint Manipulations    PT Next Visit Plan 2 more visits then probabale DC to HEp    PT Home Exercise Plan Access Code: [email or web address]             Patient will benefit from skilled therapeutic intervention in order to improve the following deficits and impairments:  Decreased range of motion, Increased fascial restricitons, Pain, Decreased activity tolerance, Decreased strength, Decreased mobility  Visit Diagnosis: Muscle spasm of back  Acute right-sided low back pain without sciatica  Muscle weakness (generalized)     Problem List Patient Active Problem List   Diagnosis Date Noted   Coronary artery calcification seen on CT scan 06/17/2020   Essential hypertension 06/17/2020   Mixed hyperlipidemia 06/17/2020   Preop cardiovascular exam 06/17/2020   Paroxysmal  atrial fibrillation ([place]) 11/13/2017   Hypertensive heart disease without CHF 11/13/2017   Chronic kidney disease (CKD), stage III (moderate) ([place]) 11/13/2017   Hypothyroidism 11/13/2017   Prostate cancer ([place]) 11/13/2017   CAD (coronary artery disease), native coronary artery 11/13/2017   Myasthenia gravis ([place]) 01/07/2015    Natalin Bible, PTA 01/06/2021, 9:26 AM  [facility] @ [address] [place], [place], [address] Phone: [phone]   Fax:  [phone]  Name: Eugene Castaneda MRN: [id number] Date of Birth: [birth year]

## 2021-01-11 ENCOUNTER — Ambulatory Visit: Payer: Medicare Other

## 2021-01-11 ENCOUNTER — Other Ambulatory Visit: Payer: Self-pay

## 2021-01-11 DIAGNOSIS — M545 Low back pain, unspecified: Secondary | ICD-10-CM | POA: Diagnosis not present

## 2021-01-11 DIAGNOSIS — M62830 Muscle spasm of back: Secondary | ICD-10-CM

## 2021-01-11 DIAGNOSIS — M6281 Muscle weakness (generalized): Secondary | ICD-10-CM

## 2021-01-11 NOTE — Therapy (Signed)
[facility] @ [address] [place], [place], [address] Phone: [phone]   Fax:  [phone]  Physical Therapy Treatment  Patient Details  Name: Eugene Castaneda MRN: [id number] Date of Birth: 1945/10/04 Referring Provider (PT): Dr. Jean Rosenthal   Encounter Date: 01/11/2021   PT End of Session - 01/11/21 0928     Visit Number 17    Date for PT Re-Evaluation 01/13/21    Authorization Type Medicare    PT Start Time 0845    PT Stop Time 0925    PT Time Calculation (min) 40 min    Activity Tolerance Patient tolerated treatment well    Behavior During Therapy [facility] for tasks assessed/performed             Past Medical History:  Diagnosis Date   CAD (coronary artery disease), native coronary artery 11/13/2017   Coronary calcification noted on CT scan    Chronic kidney disease (CKD), stage III (moderate) ([place]) 11/13/2017   Diplopia    Foley catheter present    pt has supra-pubic foley catheter in place/since 2016   GERD (gastroesophageal reflux disease)    Hyperlipemia    Hypertension    Hypertensive heart disease without CHF 11/13/2017   Hypothyroidism    Impaired fasting glucose    MG (myasthenia gravis) ([place])    Paroxysmal atrial fibrillation ([place]) 11/13/2017   RF ablation Dr. 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   Subjective Assessment - 01/11/21 0851     Subjective I am doing well.  Will be ready to D/C this week.   I feel 95% better.    Currently in Pain? No/denies                               [facility] Adult PT Treatment/Exercise - 01/11/21 0001       Lumbar Exercises: Stretches   Active Hamstring Stretch Left;Right;30 seconds;3 reps    Lower Trunk Rotation 3 reps;20 seconds    Piriformis Stretch 3 reps;30 seconds    Piriformis Stretch Limitations seated      Lumbar Exercises: Aerobic   Nustep Level 2x 10 min      Lumbar Exercises: Machines for Strengthening   Other Lumbar Machine Exercise Lat Pull 30# 2x10      Lumbar Exercises: Seated   Sit to Stand 20 reps    Sit to Stand Limitations holding 10# kettle bell- reduced eccentric control on 2nd set .      Lumbar Exercises: Supine   Clam 20 reps    Clam Limitations  red band                     PT Education - 01/11/21 0928     Education Details verbal review of all HEP with visuals    Person(s) Educated Patient    Methods Explanation;Handout    Comprehension Verbalized understanding              PT Short Term Goals - 11/04/20 1114       PT SHORT TERM GOAL #1   Title Patient is indepedent with initial HEP for flexibility    Status On-going               PT Long Term Goals - 01/11/21 2542       PT LONG TERM GOAL #1   Title Patient independent with advanced HEP for core stabilization and aquatic program he can do in the pool    Status On-going      PT LONG TERM GOAL #2   Title Patient is able to sit in a soft chair with minimal pain due to improved lumbar flexibilty    Status Deferred                   Plan - 01/11/21 0905      Clinical Impression Statement Pt reports 95% overall improvement since the start of care. Pt denies any significant functional limitations due to LBP although reports that he does modify things to avoid pain such as not doing a lot of yard work or sitting in a soft chair.  Pt is independent and compliant in HEP.  FOTO is improved to 94, meeting goal.  Pt was monitored throughout session to provide cueing as needed.  Pt will attend 1 more session in the pool and will D/C next.    PT Duration 8 weeks    PT Treatment/Interventions Aquatic Therapy;Neuromuscular re-education;Therapeutic exercise;Therapeutic activities;Patient/family education;Manual techniques;Passive range of motion;Dry needling;Spinal Manipulations;Joint Manipulations    PT Next Visit Plan D/C next session    PT Home Exercise Plan Access Code: [email or web address]    Consulted and Agree with Plan of Care Patient             Patient will benefit from skilled therapeutic intervention in order to improve the following deficits and impairments:  Decreased range of motion, Increased fascial restricitons, Pain, Decreased activity tolerance, Decreased strength, Decreased mobility  Visit Diagnosis: Muscle spasm of back  Acute right-sided low back pain without sciatica  Muscle weakness (generalized)     Problem List Patient Active Problem List   Diagnosis Date Noted   Coronary artery calcification seen on CT scan 06/17/2020   Essential hypertension 06/17/2020   Mixed hyperlipidemia 06/17/2020   Preop cardiovascular exam 06/17/2020   Paroxysmal atrial fibrillation ([place]) 11/13/2017   Hypertensive heart disease without CHF 11/13/2017   Chronic kidney disease (CKD), stage III (moderate) ([place]) 11/13/2017   Hypothyroidism 11/13/2017   Prostate cancer ([place]) 11/13/2017   CAD (coronary artery disease), native coronary artery 11/13/2017   Myasthenia gravis ([place]) 01/07/2015   Sigurd Sos, PT 01/11/21 9:30 AM   [facility] @ [address] [place], [place], [address] Phone: [phone]   Fax:  [phone]  Name: Eugene Castaneda MRN: [id number] Date of Birth: 18-Aug-1945

## 2021-01-13 ENCOUNTER — Encounter: Payer: Self-pay | Admitting: Physical Therapy

## 2021-01-13 ENCOUNTER — Ambulatory Visit: Payer: Medicare Other | Admitting: Physical Therapy

## 2021-01-13 ENCOUNTER — Other Ambulatory Visit: Payer: Self-pay

## 2021-01-13 DIAGNOSIS — M545 Low back pain, unspecified: Secondary | ICD-10-CM

## 2021-01-13 DIAGNOSIS — M6281 Muscle weakness (generalized): Secondary | ICD-10-CM | POA: Diagnosis not present

## 2021-01-13 DIAGNOSIS — M62830 Muscle spasm of back: Secondary | ICD-10-CM | POA: Diagnosis not present

## 2021-01-13 NOTE — Therapy (Addendum)
[facility] @ [facility] [place], [place], [address] Phone: [phone]   Fax:  [phone]  Physical Therapy Treatment  Patient Details  Name: Eugene Castaneda MRN: [id number] Date of Birth: Apr 21, 1945 Referring Provider (PT): Dr. Jean Rosenthal   Encounter Date: 01/13/2021   PT End of Session - 01/13/21 1111     Visit Number 18    Date for PT Re-Evaluation 01/13/21    Authorization Type Medicare    PT Start Time 0930    PT Stop Time 1010    PT Time Calculation (min) 40 min    Activity Tolerance Patient tolerated treatment well    Behavior During Therapy [facility] for tasks assessed/performed             Past Medical History:  Diagnosis Date   CAD (coronary artery disease), native coronary artery 11/13/2017   Coronary calcification noted on CT scan    Chronic kidney disease (CKD), stage III (moderate) ([place]) 11/13/2017   Diplopia    Foley catheter present    pt has supra-pubic foley catheter in place/since 2016   GERD (gastroesophageal reflux disease)    Hyperlipemia    Hypertension    Hypertensive heart disease without CHF 11/13/2017   Hypothyroidism    Impaired fasting glucose    MG (myasthenia gravis) ([place])    Paroxysmal atrial fibrillation ([place]) 11/13/2017   RF ablation Dr. 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   Subjective Assessment - 01/13/21 1110     Subjective Very pleased with my progress.    Currently in Pain? No/denies                [facility] PT Assessment - 01/13/21 0001       Assessment   Medical Diagnosis M54.50 Low back pain, unspecified back pain, laterality, unspecified chronisity, unspecified whether sciatica present    Referring Provider (PT) Dr. Jean Rosenthal      Observation/Other Assessments   Focus on Therapeutic Outcomes (FOTO)  Did not capture today we pt was in the pool                Patient seen for aquatic therapy today.  Treatment took place in water 2.5-4 feet deep depending upon activity.  Pt entered the pool via stairs with mild use of the rails. Water temp 93 degrees F. Pt requires buoyancy of water for support and to offload joints with strengthening, ROM, and flexibility exercises.    75% standing submersion: 10x water walking with blue noodle for UE push/pull, side stepping with mitten hands 10x. Lumbar stretches Bil in pool approx 20 sec  3x . Core compressions with neck pillow 5 sec hold 10x, pt states he is more aware of his core doing this exercise today. High knee marching with core activation from blue noodle push 6x. Bil hamstring stretch at second step 30 sec with 10 ankle DF/PF at end of each rep. Standing hip circumduction 1 min Bil. Seated decompression with large noodle behind patient. Intermittent LE AROM                       PT Short Term Goals - 11/04/20 1114       PT SHORT TERM GOAL #1   Title Patient is indepedent with initial HEP for flexibility    Status On-going               PT Long Term Goals - 01/13/21 1112       PT LONG TERM GOAL #1   Title Patient  independent with advanced HEP for core stabilization and aquatic program he can do in the pool    Period Weeks    Status Achieved    Target Date 12/16/20      PT LONG TERM GOAL #2   Title Patient is able to sit in a soft chair with minimal pain due to improved lumbar flexibilty    Baseline pt avoids soft chairs    Status Deferred    Target Date 12/16/20      PT LONG TERM GOAL #3   Title Patient is able to bend forward with minimal pain due to improved trunk flexion and mobility of the lumbar tissue    Time 8    Period Weeks    Status Achieved    Target Date 12/16/20      PT LONG TERM GOAL #4   Title FOTO socre >/= 75 due to less pain, increased mobility, and correct body mechanics    Time 8    Period Weeks    Status Achieved   94   Target Date 12/16/20                   Plan - 01/13/21 1114     Clinical Impression Statement Pt verbalizes understanding on how to use the water to manage his back pain.    Personal Factors and Comorbidities Comorbidity 3+;Fitness    Examination-Activity Limitations Sit;Bend    Stability/Clinical Decision Making Evolving/Moderate complexity    PT Frequency 2x / week    PT Duration 8 weeks    PT Treatment/Interventions Aquatic Therapy;Neuromuscular re-education;Therapeutic exercise;Therapeutic activities;Patient/family education;Manual techniques;Passive range of motion;Dry needling;Spinal Manipulations;Joint Manipulations    PT Next Visit Plan DC    PT Home Exercise Plan Access Code: [email or web address]    Consulted and Agree with Plan of Care Patient             Patient will benefit from skilled therapeutic intervention in order to improve the following deficits and impairments:  Decreased range of motion, Increased fascial restricitons, Pain, Decreased activity tolerance, Decreased strength, Decreased mobility  Visit Diagnosis: Muscle spasm of back  Acute right-sided low back pain without sciatica  Muscle weakness  (generalized)     Problem List Patient Active Problem List   Diagnosis Date Noted   Coronary artery calcification seen on CT scan 06/17/2020   Essential hypertension 06/17/2020   Mixed hyperlipidemia 06/17/2020   Preop cardiovascular exam 06/17/2020   Paroxysmal atrial fibrillation ([place]) 11/13/2017   Hypertensive heart disease without CHF 11/13/2017   Chronic kidney disease (CKD),  stage III (moderate) ([place]) 11/13/2017   Hypothyroidism 11/13/2017   Prostate cancer ([place]) 11/13/2017   CAD (coronary artery disease), native coronary artery 11/13/2017   Myasthenia gravis ([place]) 01/07/2015    Myrene Galas PTA  01/13/21 11:20 AM  01/13/2021, 11:16 AM PHYSICAL THERAPY DISCHARGE SUMMARY  Visits from Start of Care: 18  Current functional level related to goals / functional outcomes: Pt is ready for D/C.  See above.     Remaining deficits: See above.    Education / Equipment: HEP, Economist    Patient agrees to discharge. Patient goals were partially met. Patient is being discharged due to being pleased with the current functional level.  Eugene Castaneda, PT 01/13/21 12:28 PM   [facility] @ [facility] [place], [place], [address] Phone: [phone]   Fax:  [phone]  Name: Eugene Castaneda MRN: [id number] Date of Birth: 02-Oct-1945

## 2021-01-18 DIAGNOSIS — Z9359 Other cystostomy status: Secondary | ICD-10-CM | POA: Diagnosis not present

## 2021-01-18 DIAGNOSIS — C61 Malignant neoplasm of prostate: Secondary | ICD-10-CM | POA: Diagnosis not present

## 2021-01-18 DIAGNOSIS — R339 Retention of urine, unspecified: Secondary | ICD-10-CM | POA: Diagnosis not present

## 2021-01-18 DIAGNOSIS — Z466 Encounter for fitting and adjustment of urinary device: Secondary | ICD-10-CM | POA: Diagnosis not present

## 2021-01-18 DIAGNOSIS — R8290 Unspecified abnormal findings in urine: Secondary | ICD-10-CM | POA: Diagnosis not present

## 2021-02-05 DIAGNOSIS — R7301 Impaired fasting glucose: Secondary | ICD-10-CM | POA: Diagnosis not present

## 2021-02-05 DIAGNOSIS — G7000 Myasthenia gravis without (acute) exacerbation: Secondary | ICD-10-CM | POA: Diagnosis not present

## 2021-02-05 DIAGNOSIS — E785 Hyperlipidemia, unspecified: Secondary | ICD-10-CM | POA: Diagnosis not present

## 2021-02-05 DIAGNOSIS — C61 Malignant neoplasm of prostate: Secondary | ICD-10-CM | POA: Diagnosis not present

## 2021-02-05 DIAGNOSIS — K219 Gastro-esophageal reflux disease without esophagitis: Secondary | ICD-10-CM | POA: Diagnosis not present

## 2021-02-05 DIAGNOSIS — I10 Essential (primary) hypertension: Secondary | ICD-10-CM | POA: Diagnosis not present

## 2021-02-05 DIAGNOSIS — D126 Benign neoplasm of colon, unspecified: Secondary | ICD-10-CM | POA: Diagnosis not present

## 2021-02-05 DIAGNOSIS — M1990 Unspecified osteoarthritis, unspecified site: Secondary | ICD-10-CM | POA: Diagnosis not present

## 2021-02-05 DIAGNOSIS — D649 Anemia, unspecified: Secondary | ICD-10-CM | POA: Diagnosis not present

## 2021-02-05 DIAGNOSIS — Z9359 Other cystostomy status: Secondary | ICD-10-CM | POA: Diagnosis not present

## 2021-02-05 DIAGNOSIS — R5383 Other fatigue: Secondary | ICD-10-CM | POA: Diagnosis not present

## 2021-02-05 DIAGNOSIS — I480 Paroxysmal atrial fibrillation: Secondary | ICD-10-CM | POA: Diagnosis not present

## 2021-02-12 ENCOUNTER — Other Ambulatory Visit: Payer: Self-pay | Admitting: Adult Health

## 2021-02-17 ENCOUNTER — Ambulatory Visit (INDEPENDENT_AMBULATORY_CARE_PROVIDER_SITE_OTHER): Payer: Medicare Other | Admitting: Internal Medicine

## 2021-02-17 ENCOUNTER — Other Ambulatory Visit: Payer: Self-pay

## 2021-02-17 VITALS — BP 112/68 | HR 66 | Ht 71.0 in | Wt 192.0 lb

## 2021-02-17 DIAGNOSIS — I10 Essential (primary) hypertension: Secondary | ICD-10-CM

## 2021-02-17 DIAGNOSIS — I480 Paroxysmal atrial fibrillation: Secondary | ICD-10-CM

## 2021-02-17 DIAGNOSIS — Z4803 Encounter for change or removal of drains: Secondary | ICD-10-CM | POA: Diagnosis not present

## 2021-02-17 DIAGNOSIS — E782 Mixed hyperlipidemia: Secondary | ICD-10-CM | POA: Diagnosis not present

## 2021-02-17 NOTE — Progress Notes (Signed)
PCP: [name], MD Primary Cardiologist: Dr Harrell Gave Primary EP: Dr Rayann Heman  Eugene Castaneda is a 76 y.o. male who presents today for routine electrophysiology followup.  Since last being seen in our clinic, the patient reports doing very well.  Today, he denies symptoms of palpitations, chest pain, shortness of breath,  lower extremity edema, dizziness, presyncope, or syncope.  The patient is otherwise without complaint today.   Past Medical History:  Diagnosis Date   CAD (coronary artery disease), native coronary artery 11/13/2017   Coronary calcification noted on CT scan    Chronic kidney disease (CKD), stage III (moderate) ([place]) 11/13/2017   Diplopia    Foley catheter present    pt has supra-pubic foley catheter in place/since 2016   GERD (gastroesophageal reflux disease)    Hyperlipemia    Hypertension    Hypertensive heart disease without CHF 11/13/2017   Hypothyroidism    Impaired fasting glucose    MG (myasthenia gravis) ([place])    Paroxysmal atrial fibrillation ([place]) 11/13/2017   RF ablation Dr. 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   has supra-pubic foley catheter put in 2016/sphincter removed in 2017 replaced with the catheter   [place] N/A 07/23/2019   Procedure: [place];  Surgeon: [name], MD;  Location: [place] CV LAB;  Service: Cardiovascular;  Laterality: N/A;   ATRIAL FIBRILLATION ABLATION N/A 02/06/2020   Procedure: ATRIAL FIBRILLATION ABLATION;  Surgeon: [name], MD;  Location: [place] [facility];  Service: Cardiovascular;  Laterality: N/A;   catheter ablationfor afib  2010   COLONOSCOPY     PROSTATECTOMY  2009   TONSILLECTOMY      ROS- all systems are reviewed and negatives except as  per HPI above  Current Outpatient Medications  Medication Sig Dispense Refill   apixaban (ELIQUIS) 5 MG TABS tablet Take 1 tablet by mouth twice daily 180 tablet 1   azaTHIOprine (IMURAN) 50 MG tablet Take 2 tablets by mouth twice daily (Patient taking differently: Take 50 mg by mouth as directed. Take 1 tablet in the morning and 2 tablets at night) 360 tablet 0   cholecalciferol (VITAMIN D3) 25 MCG (1000 UNIT) tablet Take 1,000 Units by mouth daily. 1 Tablet Daily     clotrimazole-betamethasone (LOTRISONE) cream Apply 1 application topically 2 (two) times daily as needed (irritation).     CRANBERRY PO Take 1 tablet by mouth daily. 650 mg daily     Cyanocobalamin 5000 MCG CAPS Take 1 capsule by mouth daily.     ferrous sulfate 325 (65 FE) MG EC tablet Take 325 mg by mouth 2 (two) times daily.     Fish Oil OIL Take 1,000 mg by mouth daily.      flecainide (TAMBOCOR) 50 MG tablet Take 1 tablet by mouth twice daily 180 tablet 0   gluconic acid-citric acid (RENACIDIN) irrigation Irrigate with 30 mLs as directed every other day.     Incontinence Supplies (CATHETER EXTENSION TUBING) MISC Inject 1 fluid ounce into the vein as needed. Use as directed     Leuprolide Acetate (LUPRON IJ) Inject as directed as needed (Prostate cancer).      levothyroxine (SYNTHROID, LEVOTHROID) 150 MCG tablet Take 150 mcg by mouth daily before breakfast.  lisinopril (PRINIVIL,ZESTRIL) 20 MG tablet Take 20 mg by mouth 2 (two) times a day.      mirabegron ER (MYRBETRIQ) 50 MG TB24 tablet Take 50 mg by mouth as needed.     Multiple Vitamin (MULTIVITAMIN) tablet Take 1 tablet by mouth daily.     nystatin ointment (MYCOSTATIN) Apply 1 application topically as needed.     omeprazole (PRILOSEC) 20 MG capsule Take 20 mg by mouth daily.      rosuvastatin (CRESTOR) 5 MG tablet Take 5 mg by mouth daily.     vitamin C (ASCORBIC ACID) 500 MG tablet Take 500 mg by mouth daily.     Water For Irrigation, Sterile (STERILE WATER FOR  IRRIGATION) Irrigate with 1,000,000 mLs as directed as needed. Use as directed     No current facility-administered medications for this visit.    Physical Exam: Vitals:   02/17/21 1439  BP: 112/68  Pulse: 66  SpO2: 97%  Weight: 192 lb (87.1 kg)  Height: 5\' 11"  (1.803 m)    GEN- The patient is well appearing, alert and oriented x 3 today.   Head- normocephalic, atraumatic Eyes-  Sclera clear, conjunctiva pink Ears- hearing intact Oropharynx- clear Lungs- Clear to ausculation bilaterally, normal work of breathing Heart- Regular rate and rhythm, no murmurs, rubs or gallops, PMI not laterally displaced GI- soft, NT, ND, + BS Extremities- no clubbing, cyanosis, or edema  Wt Readings from Last 3 Encounters:  02/17/21 192 lb (87.1 kg)  12/14/20 194 lb 6.4 oz (88.2 kg)  11/17/20 191 lb 4 oz (86.8 kg)    EKG tracing ordered today is personally reviewed and shows sinus rhythm  first degree AV block  Assessment and Plan:  Paroxysmal atrial fibrillation Doing well s/p ablation Continue flecainide due to rare breakthrough events We discussed reduction in flecainide today, he is clear that he wishes to continue current dosing. I have reviewed multiple Kardia Mobile strips which are all sinus Chads2vasc score is 3.  He is on eliquis  2. HTN Stable No change required today  3. HL Continue crestor  Risks, benefits and potential toxicities for medications prescribed and/or refilled reviewed with patient today.   Return to AF clinic in 6 months  [name] MD, [facility] 02/17/2021 2:58 PM

## 2021-02-17 NOTE — Patient Instructions (Addendum)
Medication Instructions:  Your physician recommends that you continue on your current medications as directed. Please refer to the Current Medication list given to you today. *If you need a refill on your cardiac medications before your next appointment, please call your pharmacy*  Lab Work: None ordered. If you have labs (blood work) drawn today and your tests are completely normal, you will receive your results only by: [place] (if you have MyChart) OR A paper copy in the mail If you have any lab test that is abnormal or we need to change your treatment, we will call you to review the results.  Testing/Procedures: None ordered.  Follow-Up: At [facility], you and your health needs are our priority.  As part of our continuing mission to provide you with exceptional heart care, we have created designated Provider Care Teams.  These Care Teams include your primary Cardiologist (physician) and Advanced Practice Providers (APPs -  Physician Assistants and Nurse Practitioners) who all work together to provide you with the care you need, when you need it.  Your next appointment:   Your physician wants you to follow-up in: 6 months at the [facility]

## 2021-02-18 ENCOUNTER — Other Ambulatory Visit: Payer: Self-pay | Admitting: Adult Health

## 2021-02-22 ENCOUNTER — Ambulatory Visit: Payer: Medicare Other | Admitting: Physician Assistant

## 2021-02-22 ENCOUNTER — Ambulatory Visit: Payer: Medicare Other | Admitting: Internal Medicine

## 2021-03-11 ENCOUNTER — Other Ambulatory Visit: Payer: Self-pay | Admitting: Internal Medicine

## 2021-03-11 DIAGNOSIS — I480 Paroxysmal atrial fibrillation: Secondary | ICD-10-CM

## 2021-03-11 NOTE — Telephone Encounter (Signed)
Eliquis 5mg  refill request received. Patient is 76 years old, weight-87.1kg, Crea-1.0 on 09/16/20 via scanned labs/OV from PCP, Diagnosis-Afib, and last seen by Dr. Rayann Heman on 02/17/2021 & by Primary Cardiologist Dr. Harrell Gave on 12/14/2020. Dose is appropriate based on dosing criteria. Will send in refill to requested pharmacy.

## 2021-03-17 DIAGNOSIS — R339 Retention of urine, unspecified: Secondary | ICD-10-CM | POA: Diagnosis not present

## 2021-03-17 DIAGNOSIS — Z466 Encounter for fitting and adjustment of urinary device: Secondary | ICD-10-CM | POA: Diagnosis not present

## 2021-03-30 DIAGNOSIS — E785 Hyperlipidemia, unspecified: Secondary | ICD-10-CM | POA: Diagnosis not present

## 2021-03-30 DIAGNOSIS — I10 Essential (primary) hypertension: Secondary | ICD-10-CM | POA: Diagnosis not present

## 2021-03-30 DIAGNOSIS — M1990 Unspecified osteoarthritis, unspecified site: Secondary | ICD-10-CM | POA: Diagnosis not present

## 2021-03-30 DIAGNOSIS — I480 Paroxysmal atrial fibrillation: Secondary | ICD-10-CM | POA: Diagnosis not present

## 2021-04-14 DIAGNOSIS — Z466 Encounter for fitting and adjustment of urinary device: Secondary | ICD-10-CM | POA: Diagnosis not present

## 2021-04-14 DIAGNOSIS — R338 Other retention of urine: Secondary | ICD-10-CM | POA: Diagnosis not present

## 2021-05-06 DIAGNOSIS — C61 Malignant neoplasm of prostate: Secondary | ICD-10-CM | POA: Diagnosis not present

## 2021-05-12 DIAGNOSIS — Z466 Encounter for fitting and adjustment of urinary device: Secondary | ICD-10-CM | POA: Diagnosis not present

## 2021-05-12 DIAGNOSIS — R338 Other retention of urine: Secondary | ICD-10-CM | POA: Diagnosis not present

## 2021-05-17 DIAGNOSIS — D492 Neoplasm of unspecified behavior of bone, soft tissue, and skin: Secondary | ICD-10-CM | POA: Diagnosis not present

## 2021-05-17 DIAGNOSIS — L111 Transient acantholytic dermatosis [Grover]: Secondary | ICD-10-CM | POA: Diagnosis not present

## 2021-05-17 DIAGNOSIS — D225 Melanocytic nevi of trunk: Secondary | ICD-10-CM | POA: Diagnosis not present

## 2021-05-17 DIAGNOSIS — L821 Other seborrheic keratosis: Secondary | ICD-10-CM | POA: Diagnosis not present

## 2021-05-17 DIAGNOSIS — L814 Other melanin hyperpigmentation: Secondary | ICD-10-CM | POA: Diagnosis not present

## 2021-05-17 DIAGNOSIS — Z08 Encounter for follow-up examination after completed treatment for malignant neoplasm: Secondary | ICD-10-CM | POA: Diagnosis not present

## 2021-05-17 DIAGNOSIS — L718 Other rosacea: Secondary | ICD-10-CM | POA: Diagnosis not present

## 2021-05-17 DIAGNOSIS — L570 Actinic keratosis: Secondary | ICD-10-CM | POA: Diagnosis not present

## 2021-05-17 DIAGNOSIS — Z85828 Personal history of other malignant neoplasm of skin: Secondary | ICD-10-CM | POA: Diagnosis not present

## 2021-05-25 ENCOUNTER — Other Ambulatory Visit: Payer: Self-pay | Admitting: Neurology

## 2021-05-25 NOTE — Telephone Encounter (Signed)
Last office visit note states: ? ?"Very well controlled with current dose of Imuran 50 mg 2 tablets twice a day for many years, We will decrease Imuran to 50/100 mg" ?

## 2021-06-03 DIAGNOSIS — C61 Malignant neoplasm of prostate: Secondary | ICD-10-CM | POA: Diagnosis not present

## 2021-06-03 DIAGNOSIS — N529 Male erectile dysfunction, unspecified: Secondary | ICD-10-CM | POA: Diagnosis not present

## 2021-06-03 DIAGNOSIS — Z9359 Other cystostomy status: Secondary | ICD-10-CM | POA: Diagnosis not present

## 2021-06-07 DIAGNOSIS — I480 Paroxysmal atrial fibrillation: Secondary | ICD-10-CM | POA: Diagnosis not present

## 2021-06-07 DIAGNOSIS — E785 Hyperlipidemia, unspecified: Secondary | ICD-10-CM | POA: Diagnosis not present

## 2021-06-07 DIAGNOSIS — G7000 Myasthenia gravis without (acute) exacerbation: Secondary | ICD-10-CM | POA: Diagnosis not present

## 2021-06-07 DIAGNOSIS — R5383 Other fatigue: Secondary | ICD-10-CM | POA: Diagnosis not present

## 2021-06-07 DIAGNOSIS — I10 Essential (primary) hypertension: Secondary | ICD-10-CM | POA: Diagnosis not present

## 2021-06-07 DIAGNOSIS — Z9359 Other cystostomy status: Secondary | ICD-10-CM | POA: Diagnosis not present

## 2021-06-07 DIAGNOSIS — C61 Malignant neoplasm of prostate: Secondary | ICD-10-CM | POA: Diagnosis not present

## 2021-06-07 DIAGNOSIS — M549 Dorsalgia, unspecified: Secondary | ICD-10-CM | POA: Diagnosis not present

## 2021-06-07 DIAGNOSIS — R7301 Impaired fasting glucose: Secondary | ICD-10-CM | POA: Diagnosis not present

## 2021-06-07 DIAGNOSIS — D649 Anemia, unspecified: Secondary | ICD-10-CM | POA: Diagnosis not present

## 2021-06-07 DIAGNOSIS — E039 Hypothyroidism, unspecified: Secondary | ICD-10-CM | POA: Diagnosis not present

## 2021-06-09 DIAGNOSIS — Z466 Encounter for fitting and adjustment of urinary device: Secondary | ICD-10-CM | POA: Diagnosis not present

## 2021-06-09 DIAGNOSIS — R338 Other retention of urine: Secondary | ICD-10-CM | POA: Diagnosis not present

## 2021-06-12 ENCOUNTER — Other Ambulatory Visit: Payer: Self-pay | Admitting: Cardiology

## 2021-06-14 NOTE — Telephone Encounter (Signed)
Rx(s) sent to pharmacy electronically.  

## 2021-06-25 ENCOUNTER — Encounter (HOSPITAL_BASED_OUTPATIENT_CLINIC_OR_DEPARTMENT_OTHER): Payer: Self-pay | Admitting: Cardiology

## 2021-06-25 ENCOUNTER — Ambulatory Visit (INDEPENDENT_AMBULATORY_CARE_PROVIDER_SITE_OTHER): Payer: Medicare Other | Admitting: Cardiology

## 2021-06-25 VITALS — BP 128/70 | HR 57 | Ht 71.0 in | Wt 207.1 lb

## 2021-06-25 DIAGNOSIS — I2510 Atherosclerotic heart disease of native coronary artery without angina pectoris: Secondary | ICD-10-CM

## 2021-06-25 DIAGNOSIS — D6869 Other thrombophilia: Secondary | ICD-10-CM | POA: Diagnosis not present

## 2021-06-25 DIAGNOSIS — E782 Mixed hyperlipidemia: Secondary | ICD-10-CM

## 2021-06-25 DIAGNOSIS — I10 Essential (primary) hypertension: Secondary | ICD-10-CM

## 2021-06-25 DIAGNOSIS — Z7901 Long term (current) use of anticoagulants: Secondary | ICD-10-CM | POA: Diagnosis not present

## 2021-06-25 DIAGNOSIS — I480 Paroxysmal atrial fibrillation: Secondary | ICD-10-CM

## 2021-06-25 NOTE — Patient Instructions (Signed)

## 2021-06-25 NOTE — Progress Notes (Signed)
Cardiology Office Note:    Date:  06/25/2021   ID:  Eugene Castaneda, DOB Jun 02, 1945, MRN [id number]  PCP:  Eugene Solian, MD  Cardiologist:  Buford Dresser, MD   Referring MD: Eugene Solian, MD   CC: Follow up  History of Present Illness:    Eugene Castaneda is a 76 y.o. male with a hx of paroxysmal atrial fibrillation, hypertension, CAD without angina, sick sinus syndrome, hypothyroidism, hyperlipidemia who is seen for follow up. I initially saw him 11/27/2017  as a new patient to [facility] (previously followed by Eugene Castaneda) at the request of Eugene, [name], MD for the evaluation and management of paroxysmal afib.   Cardiac history: paroxysmal afib, on flecainide "for many years," reports prior treadmill tests that I do not have access to. Has declined anticoagulation in the past but was amenable to starting 01/2019. Uses [employer] intermittently to evaluate for afib.  At his last appointment he was feeling well with blood pressures of 130s/60-70's at home. There were some low blood pressures such as 110/60, but he was asymptomatic. Occasionally he felt brief episodes of flutters, but his palpitations were controlled on flecainide.  He followed up with Eugene Castaneda 02/17/2021, where he was doing well and continued on flecainide due to rare breakthrough events.  Today:  He has had Afib episodes recently. These are several minutes in duration. These episodes are usually early in the morning, and he notes that coffee might trigger them. The Afib episodes do not limit his physical activity. He denies any other associated symptoms. In the last week or so the episodes have been off and on, and he had not been having the episodes since 12/2020.   While his EKG was being recorded in clinic today he did feel a few "bumps."  He has had no illnesses or fevers since the last visit.    He checks his blood pressure at home occasionally, and it is usually in the 120-130/60-70  range. His blood pressure was 128/70 when measured in the office.  His statin was recently increased to 10 mg as his most recent lab work showed LDL as 81, down from being over 100.   He denies any chest pain, shortness of breath, or peripheral edema. No lightheadedness, headaches, syncope, orthopnea, or PND. No hematuria or hematochezia.   Past Medical History:  Diagnosis Date   CAD (coronary artery disease), native coronary artery 11/13/2017   Coronary calcification noted on CT scan    Chronic kidney disease (CKD), stage III (moderate) ([place]) 11/13/2017   Diplopia    Foley catheter present    pt has supra-pubic foley catheter in place/since 2016   GERD (gastroesophageal reflux disease)    Hyperlipemia    Hypertension    Hypertensive heart disease without CHF 11/13/2017   Hypothyroidism    Impaired fasting glucose    MG (myasthenia gravis) ([place])    Paroxysmal atrial fibrillation ([place]) 11/13/2017   RF ablation Dr. 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   has supra-pubic foley catheter put in 2016/sphincter removed in 2017 replaced with the catheter   [place] N/A 07/23/2019   Procedure: [place];  Surgeon: Eugene Grayer, MD;  Location: [place] [facility];  Service: Cardiovascular;  Laterality: N/A;  ATRIAL FIBRILLATION ABLATION N/A 02/06/2020   Procedure: ATRIAL FIBRILLATION ABLATION;  Surgeon: Eugene Grayer, MD;  Location: [place] [facility];  Service: Cardiovascular;  Laterality: N/A;   catheter ablationfor afib  2010   COLONOSCOPY     PROSTATECTOMY  2009   TONSILLECTOMY      Current Medications: Current Outpatient Medications on File Prior to Visit  Medication Sig   apixaban (ELIQUIS) 5 MG TABS tablet Take 1 tablet by mouth twice  daily   azaTHIOprine (IMURAN) 50 MG tablet Take 2 tablets by mouth twice daily   cholecalciferol (VITAMIN D3) 25 MCG (1000 UNIT) tablet Take 1,000 Units by mouth daily. 1 Tablet Daily   clotrimazole-betamethasone (LOTRISONE) cream Apply 1 application topically 2 (two) times daily as needed (irritation).   CRANBERRY PO Take 1 tablet by mouth daily. 650 mg daily   Cyanocobalamin 5000 MCG CAPS Take 1 capsule by mouth daily.   ferrous sulfate 325 (65 FE) MG EC tablet Take 325 mg by mouth 2 (two) times daily.   Fish Oil OIL Take 1,000 mg by mouth daily.    flecainide (TAMBOCOR) 50 MG tablet Take 1 tablet by mouth twice daily   gluconic acid-citric acid (RENACIDIN) irrigation Irrigate with 30 mLs as directed every other day.   Incontinence Supplies (CATHETER EXTENSION TUBING) MISC Inject 1 fluid ounce into the vein as needed. Use as directed   Leuprolide Acetate (LUPRON IJ) Inject as directed as needed (Prostate cancer).    levothyroxine (SYNTHROID, LEVOTHROID) 150 MCG tablet Take 150 mcg by mouth daily before breakfast.   lisinopril (PRINIVIL,ZESTRIL) 20 MG tablet Take 20 mg by mouth 2 (two) times a day.    Multiple Vitamin (MULTIVITAMIN) tablet Take 1 tablet by mouth daily.   nystatin ointment (MYCOSTATIN) Apply 1 application topically as needed.   omeprazole (PRILOSEC) 20 MG capsule Take 20 mg by mouth daily.    rosuvastatin (CRESTOR) 10 MG tablet Take 10 mg by mouth daily.   vitamin C (ASCORBIC ACID) 500 MG tablet Take 500 mg by mouth daily.   Water For Irrigation, Sterile (STERILE WATER FOR IRRIGATION) Irrigate with 1,000,000 mLs as directed as needed. Use as directed   mirabegron ER (MYRBETRIQ) 50 MG TB24 tablet Take 50 mg by mouth as needed.   No current facility-administered medications on file prior to visit.     Allergies:   Patient has no known allergies.   Social History   Tobacco Use   Smoking status: Former    Types: Cigarettes    Quit date: 02/01/1980    Years since quitting:  41.4   Smokeless tobacco: Never  Vaping Use   Vaping Use: Never used  Substance Use Topics   Alcohol use: Yes    Alcohol/week: 0.0 standard drinks    Comment: 2 glasses red wine daily   Drug use: No    Family History: The patient's family history includes Breast cancer in his mother; Parkinson's disease in his father. There is no history of Colon cancer, Esophageal cancer, or Rectal cancer.  ROS:   Please see the history of present illness.    (+) Palpitations  Additional pertinent ROS otherwise unremarkable.   EKGs/Labs/Other Studies Reviewed:    The following studies were reviewed today:  Afib Ablation 02/06/2020: 1. Sinus rhythm upon presentation.   2. Intracardiac echo reveals a moderate sized left atrium with four separate pulmonary veins without evidence of pulmonary vein stenosis. 3. All four pulmonary veins were quiescent from the prior ablations.  There was a  minor amount of electrical activity along the posterior bottom portion of the right inferior pulmonary vein.   4. Right inferior pulmonary vein isolation achieved with ablation.  5. Additional left atrial ablation was performed with a standard box lesion created along the posterior wall of the left atrium 6.   No arrhythmias were induced during isuprel infusion.  No PACs, PVCs, atrial tachycardia, atrial flutter, or other arrhythmias were observed today. 7. CTI block confirmed from a prior ablation 8. No early apparent complications.  ETT 12/17/2019:  Blood pressure demonstrated a normal response to exercise. There was no ST segment deviation noted during stress.   ETT with fair exercise tolerance (6:43); no chest pain; normal blood pressure response; no diagnostic ST changes; negative adequate exercise tolerance test.  Afib Ablation 07/23/2019: 1. Sinus rhythm upon presentation.   2. Intracardiac echo reveals a normal sized left atrium with four separate pulmonary veins without evidence of pulmonary vein  stenosis. 3.  The left pulmonary veins were quiescent from the prior ablation and did not require additional ablation today.  There was minor return of electrical activity along the anterior carina between the right superior and inferior pulmonary veins which was successfully ablated today. 4. Ectopic atrial tachycardia successfully ablated along the anteroseptal right atrium 5. Isthmus dependant atrial flutter successfully ablated along the CTI with isthmus block achieved 6. Atrial fibrillation also observed during isuprel with multiple atypical atrial flutter circuits observed, not suitable for mapping or ablation today. 7. Successful cardioversion to sinus rhythm 8. No early apparent complications.  Echo 07/03/2019: 1. Left ventricular ejection fraction, by estimation, is 60 to 65%. The  left ventricle has normal function. The left ventricle has no regional  wall motion abnormalities. Left ventricular diastolic parameters are  consistent with Grade I diastolic  dysfunction (impaired relaxation). The average left ventricular global  longitudinal strain is -19.6 %. The global longitudinal strain is normal.   2. Right ventricular systolic function is normal. The right ventricular  size is mildly enlarged. There is normal pulmonary artery systolic  pressure. The estimated right ventricular systolic pressure is 26.8 mmHg.   3. The mitral valve is normal in structure. Trivial mitral valve  regurgitation. No evidence of mitral stenosis.   4. The aortic valve is tricuspid. Aortic valve regurgitation is trivial.  Mild aortic valve sclerosis is present, with no evidence of aortic valve  stenosis. Aortic regurgitation PHT measures 1615 msec.   5. Aortic dilatation noted. There is mild dilatation of the ascending  aorta measuring 38 mm.   6. The inferior vena cava is normal in size with greater than 50%  respiratory variability, suggesting right atrial pressure of 3 mmHg.   Per Dr. Thurman Coyer  notes: treadmill myoview, 04/18/2013.  He did 9 minutes, up to stage II Bruce, for a workload total of 10 METs. Heart rate went from 62 bpm to 144 bpm. No significant ST changes or arrhythmia noted. Imaging suggests a low risk study, with inferolateral ischemia vs more likely artifact, EF 78%.  Echo 10/12/2015 Mild cLVH with normal WM. EF 55% Moderate LAE  Mild RAE Trace AR Mild MR Trace TR and PR IVC dilated, <50% RC  RF ablation 03/2008 Treadmill 05/2003, 03/2008, 03/2013 Echo 03/2003  EKG:  EKG is personally reviewed. 06/25/2021: sinus bradycardia with 1st degree AV block at 57 bpm, iRBBB 12/14/2020: not ordered 06/17/2020: Sinus rhythm, bradycardic, rate 58 bpm, iRBBB 10/23/19: sinus rhythm, 1st degree AV block, incomplete RBBB at rate of 63 bpm.  Recent Labs:  11/12/2020: Hemoglobin 13.1; Platelets 231.0   Recent Lipid Panel No results found for: CHOL, TRIG, HDL, CHOLHDL, VLDL, LDLCALC, LDLDIRECT   Physical Exam:    VS:  BP 128/70 (BP Location: Right Arm, Patient Position: Sitting, Cuff Size: Normal)   Pulse (!) 57   Ht '5\' 11"'$  (1.803 m)   Wt 207 lb 1.6 oz (93.9 kg)   BMI 28.88 kg/m     Wt Readings from Last 3 Encounters:  06/25/21 207 lb 1.6 oz (93.9 kg)  02/17/21 192 lb (87.1 kg)  12/14/20 194 lb 6.4 oz (88.2 kg)    GEN: Well nourished, well developed in no acute distress HEENT: Normal, moist mucous membranes NECK: No JVD CARDIAC: regular rhythm, normal S1 and S2, no rubs or gallops. No murmur. VASCULAR: Radial and DP pulses 2+ bilaterally. No carotid bruits RESPIRATORY:  Clear to auscultation without rales, wheezing or rhonchi  ABDOMEN: Soft, non-tender, non-distended MUSCULOSKELETAL:  Ambulates independently SKIN: Warm and dry, no edema NEUROLOGIC:  Alert and oriented x 3. No focal neuro deficits noted. PSYCHIATRIC:  Normal affect    ASSESSMENT:    1. Paroxysmal atrial fibrillation (HCC)   2. Essential hypertension   3. Mixed hyperlipidemia   4. Coronary  artery calcification seen on CT scan   5. Secondary hypercoagulable state ([place])   6. Long term current use of anticoagulant      PLAN:    Paroxysmal atrial fibrillation ([place]) -s/p ablation but had recurrent afib, restarted flecainide. We have discussed risk of flecainide with ischemia. Denies ischemic symptoms, discussed what to watch for  -had brief episodes this week, caught on [place]. He will monitor. If symptoms increase in frequency/duration, he will contact me and we will get him seen by EP -CHA2DS2/VAS Stroke Risk Points = 5    -continue apixaban, denies any bleeding issues    Mixed hyperlipidemia Discussed in the context of coronary calcium. Last LDL 68, goal <70.  -continue rosuvastatin   CAD (coronary artery disease), native coronary artery Swims for >1 hour three times/week without limitations. No angina. On statin, LDL at goal. No aspirin. ETT reviewed.   Essential hypertension Well controlled, continue lisinopril.  CV risk and prevention counseling: -recommend heart healthy/Mediterranean diet, with whole grains, fruits, vegetable, fish, lean meats, nuts, and olive oil. Limit salt. -recommend moderate walking, 3-5 times/week for 30-50 minutes each session. Aim for at least 150 minutes.week. Goal should be pace of 3 miles/hours, or walking 1.5 miles in 30 minutes -recommend avoidance of tobacco products. Avoid excess alcohol.  Plan for follow up: 6 mos or sooner with me as needed.   Buford Dresser, MD, PhD [place]  [facility]   Medication Adjustments/Labs and Tests Ordered: Current medicines are reviewed at length with the patient today.  Concerns regarding medicines are outlined above.  Orders Placed This Encounter  Procedures   EKG 12-Lead    No orders of the defined types were placed in this encounter.    Patient Instructions  Medication Instructions:  Your Physician recommend you continue on your current medication as directed.     *If you need a refill on your cardiac medications before your next appointment, please call your pharmacy*   Lab Work: None ordered today   Testing/Procedures: None ordered today   Follow-Up: At [facility], you and your health needs are our priority.  As part of our continuing mission to provide you with exceptional heart care, we have created designated Provider Care Teams.  These Care Teams include your  primary Cardiologist (physician) and Advanced Practice Providers (APPs -  Physician Assistants and Nurse Practitioners) who all work together to provide you with the care you need, when you need it.  We recommend signing up for the patient portal called "MyChart".  Sign up information is provided on this After Visit Summary.  MyChart is used to connect with patients for Virtual Visits (Telemedicine).  Patients are able to view lab/test results, encounter notes, upcoming appointments, etc.  Non-urgent messages can be sent to your provider as well.   To learn more about what you can do with MyChart, go to [email or web address].    Your next appointment:   6 month(s)  The format for your next appointment:   In Person  Provider:   Buford Dresser, MD{            I,Mathew Stumpf,acting as a scribe for Buford Dresser, MD.,have documented all relevant documentation on the behalf of Buford Dresser, MD,as directed by  Buford Dresser, MD while in the presence of Buford Dresser, MD.  I, Buford Dresser, MD, have reviewed all documentation for this visit. The documentation on 06/25/21 for the exam, diagnosis, procedures, and orders are all accurate and complete.  Signed, Buford Dresser, MD PhD 06/25/2021 1:18 PM    [facility]

## 2021-06-29 DIAGNOSIS — H43813 Vitreous degeneration, bilateral: Secondary | ICD-10-CM | POA: Diagnosis not present

## 2021-06-29 DIAGNOSIS — H52203 Unspecified astigmatism, bilateral: Secondary | ICD-10-CM | POA: Diagnosis not present

## 2021-06-29 DIAGNOSIS — H5203 Hypermetropia, bilateral: Secondary | ICD-10-CM | POA: Diagnosis not present

## 2021-06-29 DIAGNOSIS — H25813 Combined forms of age-related cataract, bilateral: Secondary | ICD-10-CM | POA: Diagnosis not present

## 2021-07-07 DIAGNOSIS — Z466 Encounter for fitting and adjustment of urinary device: Secondary | ICD-10-CM | POA: Diagnosis not present

## 2021-07-07 DIAGNOSIS — Z9359 Other cystostomy status: Secondary | ICD-10-CM | POA: Diagnosis not present

## 2021-07-14 DIAGNOSIS — Z9359 Other cystostomy status: Secondary | ICD-10-CM | POA: Diagnosis not present

## 2021-07-29 DIAGNOSIS — R3989 Other symptoms and signs involving the genitourinary system: Secondary | ICD-10-CM | POA: Diagnosis not present

## 2021-08-09 DIAGNOSIS — C61 Malignant neoplasm of prostate: Secondary | ICD-10-CM | POA: Diagnosis not present

## 2021-08-10 DIAGNOSIS — T83511D Infection and inflammatory reaction due to indwelling urethral catheter, subsequent encounter: Secondary | ICD-10-CM | POA: Diagnosis not present

## 2021-08-10 DIAGNOSIS — N390 Urinary tract infection, site not specified: Secondary | ICD-10-CM | POA: Diagnosis not present

## 2021-08-16 ENCOUNTER — Ambulatory Visit (HOSPITAL_COMMUNITY): Payer: Medicare Other | Admitting: Physician Assistant

## 2021-08-16 ENCOUNTER — Ambulatory Visit (HOSPITAL_COMMUNITY)
Admission: RE | Admit: 2021-08-16 | Discharge: 2021-08-16 | Disposition: A | Discharge status: Home / Self Care | Payer: Medicare Other | Source: Ambulatory Visit | Attending: Physician Assistant | Admitting: Physician Assistant

## 2021-08-16 VITALS — BP 122/60 | HR 58 | Ht 71.0 in | Wt 198.0 lb

## 2021-08-16 DIAGNOSIS — D6869 Other thrombophilia: Secondary | ICD-10-CM

## 2021-08-16 DIAGNOSIS — Z7901 Long term (current) use of anticoagulants: Secondary | ICD-10-CM | POA: Diagnosis not present

## 2021-08-16 DIAGNOSIS — J0100 Acute maxillary sinusitis, unspecified: Secondary | ICD-10-CM | POA: Diagnosis not present

## 2021-08-16 DIAGNOSIS — I480 Paroxysmal atrial fibrillation: Secondary | ICD-10-CM | POA: Diagnosis not present

## 2021-08-16 DIAGNOSIS — R000 Tachycardia, unspecified: Secondary | ICD-10-CM | POA: Diagnosis not present

## 2021-08-16 DIAGNOSIS — Z1152 Encounter for screening for COVID-19: Secondary | ICD-10-CM | POA: Diagnosis not present

## 2021-08-16 DIAGNOSIS — I10 Essential (primary) hypertension: Secondary | ICD-10-CM | POA: Diagnosis not present

## 2021-08-16 NOTE — Progress Notes (Signed)
Primary Care Physician: [name], MD Referring Physician: Dr. [name] is a 76 y.o. male with a h/o paroxysmal afib , tachycardia that had a 3rd  repeat ablation with Dr. Rayann Heman one  month ago.  Initial afib ablation several years ago with Dr. [name] in [place].   Eugene Castaneda No swallowing or groin issues. He continues  on flecainide. He is not currently on any daily AV nodal agents.  He has been staying in [place]. He had been taking omeprazole and Protonix, started feeling worse a few weeks out of ablation and stopped Protonix with resolution of symptoms. He just started having some indigestion with burping a few days ago. Started back on both meds again. Suggested to stop omeprazole until he finished Protonix and then resume.   Follow up in the AF clinic 08/16/21. Patient reports that he has done well from a cardia standpoint. About one month ago, he had some episodes of afib in the setting of a UTI. No bleeding issues on anticoagulation.   T oday, he denies symptoms of palpitations, chest pain, shortness of breath, orthopnea, PND, lower extremity edema, dizziness, presyncope, syncope, or neurologic sequela. The patient is tolerating medications without difficulties and is otherwise without complaint today.   Past Medical History:  Diagnosis Date   CAD (coronary artery disease), native coronary artery 11/13/2017   Coronary calcification noted on CT scan    Chronic kidney disease (CKD), stage III (moderate) ([place]) 11/13/2017   Diplopia    Foley catheter present    pt has supra-pubic foley catheter in place/since 2016   GERD (gastroesophageal reflux disease)    Hyperlipemia    Hypertension    Hypertensive heart disease without CHF 11/13/2017   Hypothyroidism    Impaired fasting glucose    MG (myasthenia gravis) ([place])    Paroxysmal atrial fibrillation ([place]) 11/13/2017   RF ablation Dr. 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   has supra-pubic foley catheter put in 2016/sphincter removed in 2017 replaced with the catheter   [place] N/A 07/23/2019   Procedure: [place];  Surgeon: [name], MD;  Location: [place] [facility];  Service: Cardiovascular;  Laterality: N/A;   ATRIAL FIBRILLATION ABLATION N/A 02/06/2020   Procedure: ATRIAL FIBRILLATION ABLATION;  Surgeon: [name], MD;  Location: [place] [facility];  Service: Cardiovascular;  Laterality: N/A;   catheter ablationfor afib  2010   COLONOSCOPY     PROSTATECTOMY  2009   TONSILLECTOMY      Current Outpatient Medications  Medication Sig Dispense Refill   apixaban (ELIQUIS) 5 MG TABS tablet Take 1 tablet by mouth twice daily 180 tablet 1   azaTHIOprine (IMURAN) 50 MG tablet Take 2 tablets by mouth twice daily 360 tablet 0   cholecalciferol (VITAMIN D3) 25 MCG (1000 UNIT) tablet Take 1,000 Units by mouth daily. 1 Tablet Daily     clotrimazole-betamethasone (LOTRISONE) cream Apply 1 application topically 2 (two) times daily as needed (irritation).     CRANBERRY PO Take 1 tablet by mouth daily. 650 mg daily     Cyanocobalamin 5000 MCG CAPS Take 1 capsule by mouth daily.     ferrous sulfate 325 (65 FE) MG EC tablet Take 325 mg by mouth 2 (two)  times daily.     Fish Oil OIL Take 1,000 mg by mouth daily.      flecainide (TAMBOCOR) 50 MG tablet Take 1 tablet by mouth twice daily 180 tablet 2   gluconic acid-citric acid (RENACIDIN) irrigation Irrigate with 30 mLs as directed every other day.     Incontinence Supplies (CATHETER EXTENSION TUBING) MISC Inject 1 fluid ounce into the vein as needed. Use as directed     Leuprolide Acetate (LUPRON IJ) Inject as directed as needed (Prostate cancer).      levothyroxine (SYNTHROID, LEVOTHROID) 150 MCG tablet Take 150 mcg by  mouth daily before breakfast.     lisinopril (PRINIVIL,ZESTRIL) 20 MG tablet Take 20 mg by mouth 2 (two) times a day.      mirabegron ER (MYRBETRIQ) 50 MG TB24 tablet Take 50 mg by mouth as needed.     Multiple Vitamin (MULTIVITAMIN) tablet Take 1 tablet by mouth daily.     nystatin ointment (MYCOSTATIN) Apply 1 application topically as needed.     omeprazole (PRILOSEC) 20 MG capsule Take 20 mg by mouth daily.      rosuvastatin (CRESTOR) 10 MG tablet Take 10 mg by mouth daily.     vitamin C (ASCORBIC ACID) 500 MG tablet Take 500 mg by mouth daily.     Water For Irrigation, Sterile (STERILE WATER FOR IRRIGATION) Irrigate with 1,000,000 mLs as directed as needed. Use as directed     No current facility-administered medications for this encounter.    No Known Allergies  Social History   Socioeconomic History   Marital status: Married    Spouse name: Not on file   Number of children: 2   Years of education: Masters   Highest education level: Not on file  Occupational History   Occupation: Retired  Tobacco Use   Smoking status: Former    Types: Cigarettes    Quit date: 02/01/1980    Years since quitting: 41.5   Smokeless tobacco: Never  Vaping Use   Vaping Use: Never used  Substance and Sexual Activity   Alcohol use: Yes    Alcohol/week: 0.0 standard drinks of alcohol    Comment: 2 glasses red wine daily   Drug use: No   Sexual activity: Not on file  Other Topics Concern   Not on file  Social History Narrative   Lives in [place] with spouse.   Right-handed.   2 cups caffeine daily.   Retired from Database administrator   Social Determinants of Radio broadcast assistant Strain: Not on file  Food Insecurity: Not on file  Transportation Needs: Not on file  Physical Activity: Not on file  Stress: Not on file  Social Connections: Not on file  Intimate Partner Violence: Not on file    Family History  Problem Relation Age of Onset   Breast cancer Mother     Parkinson's disease Father    Colon cancer Neg Hx    Esophageal cancer Neg Hx    Rectal cancer Neg Hx     ROS- All systems are reviewed and negative except as per the HPI above  Physical Exam: Vitals:   08/16/21 0825  BP: 122/60  Pulse: (!) 58  Weight: 89.8 kg  Height: '5\' 11"'$  (1.803 m)    Wt Readings from Last 3 Encounters:  08/16/21 89.8 kg  06/25/21 93.9 kg  02/17/21 87.1 kg    Labs: Lab Results  Component Value Date   NA 139 01/20/2020   K 4.9  01/20/2020   CL 100 01/20/2020   CO2 24 01/20/2020   GLUCOSE 104 (H) 01/20/2020   BUN 17 01/20/2020   CREATININE 1.07 01/20/2020   CALCIUM 9.9 01/20/2020   Lab Results  Component Value Date   INR 2.5 (H) 04/03/2008   No results found for: "CHOL", "HDL", "LDLCALC", "TRIG"   GEN- The patient is a well appearing elderly male, alert and oriented x 3 today.   HEENT-head normocephalic, atraumatic, sclera clear, conjunctiva pink, hearing intact, trachea midline. Lungs- Clear to ausculation bilaterally, normal work of breathing Heart- Regular rate and rhythm, no murmurs, rubs or gallops  GI- soft, NT, ND, + BS Extremities- no clubbing, cyanosis, or edema MS- no significant deformity or atrophy Skin- no rash or lesion Psych- euthymic mood, full affect Neuro- strength and sensation are intact   EKG- SB, 1st degree AV block Vent. rate 58 BPM PR interval 240 ms QRS duration 96 ms QT/QTcB 422/414 ms   Assessment and Plan: 1. Paroxysmal Afib  S/p afib ablation 02/06/20 Patient in [place] today. Has [employer]ia mobile for home monitoring.  Continue flecainide 50 mg bid   2. CHA2DS2VASc score of 4 Continue eliquis 5 mg bid   3. HTN Stable, no changes today.   Follow up with Dr Harrell Gave as scheduled. AF clinic 6-8 months.    [facility] [address] [place], [place] [address] [phone]

## 2021-08-17 ENCOUNTER — Other Ambulatory Visit: Payer: Self-pay | Admitting: Neurology

## 2021-08-17 NOTE — Telephone Encounter (Signed)
Rx refilled.

## 2021-09-01 DIAGNOSIS — R339 Retention of urine, unspecified: Secondary | ICD-10-CM | POA: Diagnosis not present

## 2021-09-01 DIAGNOSIS — Z466 Encounter for fitting and adjustment of urinary device: Secondary | ICD-10-CM | POA: Diagnosis not present

## 2021-09-06 ENCOUNTER — Other Ambulatory Visit: Payer: Self-pay | Admitting: Internal Medicine

## 2021-09-06 DIAGNOSIS — I480 Paroxysmal atrial fibrillation: Secondary | ICD-10-CM

## 2021-09-06 NOTE — Telephone Encounter (Deleted)
Prescription refill request for Eliquis received. Indication: PAF Last office visit: 08/06/21  C [place] PA Scr: 1.07 on 01/20/20  (requested CBC/BMP at next appt 11.27.23) Age: 76 Weight: 89.9kg  Based on above findings Eliquis '5mg'$  twice daily is the appropriate dose.  Refill approved.

## 2021-09-06 NOTE — Telephone Encounter (Signed)
Eliquis '5mg'$  refill request received. Patient is 76 years old, weight-89.8kg, Crea-1.07 on 01/20/2020-needs labs, Diagnosis-Afib, and last seen by Malka So on 08/16/2021. Dose is appropriate based on dosing criteria.   Will need to call PCP to see if any labs

## 2021-09-29 DIAGNOSIS — R339 Retention of urine, unspecified: Secondary | ICD-10-CM | POA: Diagnosis not present

## 2021-09-29 DIAGNOSIS — Z466 Encounter for fitting and adjustment of urinary device: Secondary | ICD-10-CM | POA: Diagnosis not present

## 2021-10-05 DIAGNOSIS — L298 Other pruritus: Secondary | ICD-10-CM | POA: Diagnosis not present

## 2021-10-05 DIAGNOSIS — C44329 Squamous cell carcinoma of skin of other parts of face: Secondary | ICD-10-CM | POA: Diagnosis not present

## 2021-10-05 DIAGNOSIS — D492 Neoplasm of unspecified behavior of bone, soft tissue, and skin: Secondary | ICD-10-CM | POA: Diagnosis not present

## 2021-10-05 DIAGNOSIS — R58 Hemorrhage, not elsewhere classified: Secondary | ICD-10-CM | POA: Diagnosis not present

## 2021-10-05 DIAGNOSIS — L570 Actinic keratosis: Secondary | ICD-10-CM | POA: Diagnosis not present

## 2021-10-06 DIAGNOSIS — Z0000 Encounter for general adult medical examination without abnormal findings: Secondary | ICD-10-CM | POA: Diagnosis not present

## 2021-10-06 DIAGNOSIS — E785 Hyperlipidemia, unspecified: Secondary | ICD-10-CM | POA: Diagnosis not present

## 2021-10-06 DIAGNOSIS — E039 Hypothyroidism, unspecified: Secondary | ICD-10-CM | POA: Diagnosis not present

## 2021-10-06 DIAGNOSIS — R5383 Other fatigue: Secondary | ICD-10-CM | POA: Diagnosis not present

## 2021-10-06 DIAGNOSIS — D649 Anemia, unspecified: Secondary | ICD-10-CM | POA: Diagnosis not present

## 2021-10-06 DIAGNOSIS — R7301 Impaired fasting glucose: Secondary | ICD-10-CM | POA: Diagnosis not present

## 2021-10-06 DIAGNOSIS — Z125 Encounter for screening for malignant neoplasm of prostate: Secondary | ICD-10-CM | POA: Diagnosis not present

## 2021-10-06 DIAGNOSIS — I10 Essential (primary) hypertension: Secondary | ICD-10-CM | POA: Diagnosis not present

## 2021-10-06 DIAGNOSIS — R7989 Other specified abnormal findings of blood chemistry: Secondary | ICD-10-CM | POA: Diagnosis not present

## 2021-10-12 DIAGNOSIS — R82998 Other abnormal findings in urine: Secondary | ICD-10-CM | POA: Diagnosis not present

## 2021-10-13 DIAGNOSIS — R7301 Impaired fasting glucose: Secondary | ICD-10-CM | POA: Diagnosis not present

## 2021-10-13 DIAGNOSIS — M1990 Unspecified osteoarthritis, unspecified site: Secondary | ICD-10-CM | POA: Diagnosis not present

## 2021-10-13 DIAGNOSIS — R5383 Other fatigue: Secondary | ICD-10-CM | POA: Diagnosis not present

## 2021-10-13 DIAGNOSIS — E039 Hypothyroidism, unspecified: Secondary | ICD-10-CM | POA: Diagnosis not present

## 2021-10-13 DIAGNOSIS — I480 Paroxysmal atrial fibrillation: Secondary | ICD-10-CM | POA: Diagnosis not present

## 2021-10-13 DIAGNOSIS — D649 Anemia, unspecified: Secondary | ICD-10-CM | POA: Diagnosis not present

## 2021-10-13 DIAGNOSIS — I129 Hypertensive chronic kidney disease with stage 1 through stage 4 chronic kidney disease, or unspecified chronic kidney disease: Secondary | ICD-10-CM | POA: Diagnosis not present

## 2021-10-13 DIAGNOSIS — D126 Benign neoplasm of colon, unspecified: Secondary | ICD-10-CM | POA: Diagnosis not present

## 2021-10-13 DIAGNOSIS — Z0000 Encounter for general adult medical examination without abnormal findings: Secondary | ICD-10-CM | POA: Diagnosis not present

## 2021-10-13 DIAGNOSIS — E785 Hyperlipidemia, unspecified: Secondary | ICD-10-CM | POA: Diagnosis not present

## 2021-10-13 DIAGNOSIS — N1831 Chronic kidney disease, stage 3a: Secondary | ICD-10-CM | POA: Diagnosis not present

## 2021-10-13 DIAGNOSIS — C61 Malignant neoplasm of prostate: Secondary | ICD-10-CM | POA: Diagnosis not present

## 2021-10-27 DIAGNOSIS — Z9359 Other cystostomy status: Secondary | ICD-10-CM | POA: Diagnosis not present

## 2021-10-27 DIAGNOSIS — Z466 Encounter for fitting and adjustment of urinary device: Secondary | ICD-10-CM | POA: Diagnosis not present

## 2021-10-27 DIAGNOSIS — R339 Retention of urine, unspecified: Secondary | ICD-10-CM | POA: Diagnosis not present

## 2021-10-27 NOTE — Progress Notes (Unsigned)
No chief complaint on file.     ASSESSMENT AND PLAN  Eugene Castaneda is a 76 y.o. male   Serum positive generalized myasthenia gravis  Very well controlled with current dose of Imuran 50 mg 2 tablets twice a day for many years,  We will decrease Imuran to 50/100 mg  Follow-up in 1 year, if he continues to do well, we will continue to decrease Imuran to 50/50 mg  Laboratory evaluation by his primary care physician  DIAGNOSTIC DATA (LABS, IMAGING, TESTING) - I reviewed patient records, labs, notes, testing and imaging myself where available.   MEDICAL HISTORY:  Eugene Castaneda is here to follow-up for serum positive generalized myasthenia gravis. 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 Dagmar Hait in May 2016 for evaluation of double vision, he also has intermittent left ptosis, at its worst, he also has mild bulbar, neck flexion weakness, mild chewing difficulty, mild proximal upper extremity weakness. He never had significant swallowing, breathing difficulty, no gait difficulty   CT chest showed no evidence of thymoma May 2016 MRI of the brain in May 2016 showed mild atrophy His symptoms responded very well to Mestinon 60 mg 3 times a day, no significant side effect Prednisone was started since May 2016, up to 40 mg daily, he responded well, was able to tolerate tapering dose of prednisone  Imuran was started in May 21st 2016, 50 mg 2 tablets twice a day   He has urinary incontinence, planning on to have urethral reconstruction surgery by [facility] urologist Dr.Terlecki   He had urethroplasty in 10/2013. 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, receiving Cardiac monitoring reported bradycardia episode, 25/m during sleep   UPDATE April 07 2016:  I reviewed primary care note from Dr.[name], he was found to have mild abnormal liver functional tests, highly suspected for recent initiation of atrovastatin, also complicated by Imuran use, wine intake, will have repeat laboratory evaluation on September twelfth 2017, if still elevated, he will hold atrovastatin for 4 weeks, and recheck, A1c was 5.6,    Repeat laboratory evaluation in January 2018 showed AST 62, ALT 49, LDL 76, cholesterol 139, he is on lower dose of atorvastatin now,   He is now taking Imuran 50 mg 2 tablets twice a day, at extreme gaze he has mild double vision, no problem reading, he has several episodes he has mild swallowing trouble, he feels like that the steak stuck behind his sternum, he has mild hoarseness,     He was treated with Tamiflu in Jan 2018 as preventive medication.   He swim one hour  without difficulty.     UPDATE Sept 10 2018: He was seen by Dr. Radene Gunning on July 01 2016, lab evaluations showed CMP, glucose 110, creatinine 0.8, mild elevated AST 59, ALT 44, WBC was 3.79, hemoglobin of 14.2, normal thyroid functional tasks, LDL was 70, A1c 5.8.   He is taking Imuran 50 mg twice a day, doing very well, no significant side effect noticed, continue has mild  double vision on extreme gaze, able to swim regularly   UPDATE Sept 11 2019: He is overall doing very well, there is no recurrent muscle weakness, when he drives, looking to extreme left or right, he had transient double vision, he denies swallowing difficulty, no limb muscle weakness, able to swim 1 hour without stopping.   Laboratory evaluations in May 2019, A1c was 5.7, normal CBC, creatinine of 0.9, hemoglobin of 14.5, normal TSH, 0.93, elevated PSA 6.98  Update October 22, 2020: He is overall doing very well, no recurrent double vision, droopy eyelid, or limb muscle weakness, taking Imuran 50 mg 2 tablets twice a day, does not take Mestinon, able to swim freestyle 1 hour without difficulty,  Update October 28, 2021 SS:    PHYSICAL EXAM:   There were no vitals filed for this visit.  Not recorded     There is no height or weight on file to calculate BMI.  PHYSICAL EXAMNIATION:  Gen: NAD, conversant, well nourised, well groomed        NEUROLOGICAL EXAM:  MENTAL STATUS: Speech/cognition: Awake, alert, oriented to history taking and casual conversation   CRANIAL NERVES: CN II: Visual fields are full to confrontation. Pupils are round equal and briskly reactive to light. CN III, IV, VI: extraocular movement are normal. No ptosis. CN V: Facial sensation is intact to light touch CN VII: Face is symmetric with normal eye closure  CN VIII: Hearing is normal to causal conversation. CN IX, X: Phonation is normal. CN XI: Head turning and shoulder shrug are intact  MOTOR: Normal muscle tone, bulk and strength  REFLEXES: Reflexes are 1 and symmetric at the biceps, triceps, knees, and ankles.    SENSORY: Intact to light touch, pinprick and vibratory sensation are intact in fingers and toes.  COORDINATION: There is no trunk or limb dysmetria noted.  GAIT/STANCE: Normal  REVIEW OF SYSTEMS:  Full 14 system review of systems performed and notable only for as above All  other review of systems were negative.   ALLERGIES: No Known Allergies  HOME MEDICATIONS: Current Outpatient Medications  Medication Sig Dispense Refill   apixaban (ELIQUIS) 5 MG TABS tablet Take 1 tablet by mouth twice daily 180 tablet 1   azaTHIOprine (IMURAN) 50 MG tablet Take 2 tablets by mouth twice daily 360 tablet 0   cholecalciferol (VITAMIN D3) 25 MCG (1000 UNIT) tablet Take 1,000 Units by mouth daily. 1 Tablet Daily     clotrimazole-betamethasone (LOTRISONE) cream Apply 1 application topically 2 (two) times daily as needed (irritation).     CRANBERRY PO Take 1 tablet by mouth daily. 650 mg daily     Cyanocobalamin 5000 MCG CAPS Take 1 capsule by mouth daily.     ferrous sulfate 325 (65 FE) MG EC tablet Take 325 mg by mouth 2 (two) times daily.     Fish Oil OIL Take 1,000 mg by mouth daily.      flecainide (TAMBOCOR) 50 MG tablet Take 1 tablet by mouth twice daily 180 tablet 2   gluconic acid-citric acid (RENACIDIN) irrigation Irrigate with 30  mLs as directed every other day.     Incontinence Supplies (CATHETER EXTENSION TUBING) MISC Inject 1 fluid ounce into the vein as needed. Use as directed     Leuprolide Acetate (LUPRON IJ) Inject as directed as needed (Prostate cancer).      levothyroxine (SYNTHROID, LEVOTHROID) 150 MCG tablet Take 150 mcg by mouth daily before breakfast.     lisinopril (PRINIVIL,ZESTRIL) 20 MG tablet Take 20 mg by mouth 2 (two) times a day.      mirabegron ER (MYRBETRIQ) 50 MG TB24 tablet Take 50 mg by mouth as needed.     Multiple Vitamin (MULTIVITAMIN) tablet Take 1 tablet by mouth daily.     nystatin ointment (MYCOSTATIN) Apply 1 application topically as needed.     omeprazole (PRILOSEC) 20 MG capsule Take 20 mg by mouth daily.      rosuvastatin (CRESTOR) 10 MG tablet Take 10 mg by mouth daily.     vitamin C (ASCORBIC ACID) 500 MG tablet Take 500 mg by mouth daily.     Water For Irrigation, Sterile (STERILE WATER FOR IRRIGATION) Irrigate with 1,000,000  mLs as directed as needed. Use as directed     No current facility-administered medications for this visit.    PAST MEDICAL HISTORY: Past Medical History:  Diagnosis Date   CAD (coronary artery disease), native coronary artery 11/13/2017   Coronary calcification noted on CT scan    Chronic kidney disease (CKD), stage III (moderate) ([place]) 11/13/2017   Diplopia    Foley catheter present    pt has supra-pubic foley catheter in place/since 2016   GERD (gastroesophageal reflux disease)    Hyperlipemia    Hypertension    Hypertensive heart disease without CHF 11/13/2017   Hypothyroidism    Impaired fasting glucose    MG (myasthenia gravis) ([place])    Paroxysmal atrial fibrillation ([place]) 11/13/2017   RF ablation Dr. [name] 2010 [facility] CHADSVASC 3   Prostate cancer [facility]) 2009   Urethral obstruction    Urinary retention    Suprapubic Catheter placed 04/25/15    PAST SURGICAL HISTORY: Past Surgical History:  Procedure Laterality Date   Artificial Urinary Sphincter  09/2014   has supra-pubic foley catheter put in 2016/sphincter removed in 2017 replaced with the catheter   [place] N/A 07/23/2019   Procedure: [place];  Surgeon: [name], MD;  Location: [place] [facility];  Service: Cardiovascular;  Laterality: N/A;   ATRIAL FIBRILLATION ABLATION N/A 02/06/2020   Procedure: ATRIAL FIBRILLATION ABLATION;  Surgeon: [name], MD;  Location: [place] [facility];  Service: Cardiovascular;  Laterality: N/A;   catheter ablationfor afib  2010   COLONOSCOPY     PROSTATECTOMY  2009   TONSILLECTOMY      FAMILY HISTORY: Family History  Problem Relation Age of Onset   Breast cancer Mother    Parkinson's disease Father    Colon cancer Neg Hx    Esophageal cancer Neg Hx    Rectal cancer Neg Hx     SOCIAL HISTORY: Social History   Socioeconomic History   Marital status: Married    Spouse name: Not on file   Number of children: 2    Years of education: Masters   Highest education level: Not on file  Occupational History   Occupation: Retired  Tobacco Use   Smoking status: Former    Types: Cigarettes    Quit date: 02/01/1980    Years since quitting: 41.7   Smokeless tobacco: Never  Vaping Use   Vaping Use: Never used  Substance and Sexual Activity   Alcohol use: Yes    Alcohol/week: 0.0 standard drinks of alcohol    Comment: 2 glasses red wine daily   Drug use: No   Sexual activity: Not on file  Other Topics Concern   Not on file  Social History Narrative   Lives in [place] with spouse.   Right-handed.   2 cups caffeine daily.   Retired from Database administrator   Social Determinants of Radio broadcast assistant Strain: Not on file  Food Insecurity: Not on file  Transportation Needs: Not on file  Physical Activity: Not on file  Stress: Not on file  Social Connections: Not on file  Intimate Partner Violence: Not on file     Butler Denmark, Laqueta Jean, [facility] [address], [place], [place] [address] [phone]

## 2021-10-28 ENCOUNTER — Ambulatory Visit (INDEPENDENT_AMBULATORY_CARE_PROVIDER_SITE_OTHER): Payer: Medicare Other | Admitting: Neurology

## 2021-10-28 ENCOUNTER — Ambulatory Visit: Payer: Medicare Other | Admitting: Neurology

## 2021-10-28 ENCOUNTER — Encounter: Payer: Self-pay | Admitting: Neurology

## 2021-10-28 VITALS — BP 143/53 | HR 49 | Ht 70.5 in | Wt 203.3 lb

## 2021-10-28 DIAGNOSIS — G7000 Myasthenia gravis without (acute) exacerbation: Secondary | ICD-10-CM | POA: Diagnosis not present

## 2021-10-28 MED ORDER — AZATHIOPRINE 50 MG PO TABS
50.0000 mg | ORAL_TABLET | Freq: Two times a day (BID) | ORAL | 3 refills | Status: DC
Start: 2021-10-28 — End: 2022-10-25

## 2021-10-28 NOTE — Patient Instructions (Signed)
Reduce Imuran to 50 mg twice daily Monitor  for any return of weakness or vision change See you back in 1 year, call us sooner if you need [place]!

## 2021-11-06 DIAGNOSIS — Z23 Encounter for immunization: Secondary | ICD-10-CM | POA: Diagnosis not present

## 2021-11-09 DIAGNOSIS — R339 Retention of urine, unspecified: Secondary | ICD-10-CM | POA: Diagnosis not present

## 2021-11-09 DIAGNOSIS — C61 Malignant neoplasm of prostate: Secondary | ICD-10-CM | POA: Diagnosis not present

## 2021-11-10 DIAGNOSIS — C44329 Squamous cell carcinoma of skin of other parts of face: Secondary | ICD-10-CM | POA: Diagnosis not present

## 2021-11-10 DIAGNOSIS — C61 Malignant neoplasm of prostate: Secondary | ICD-10-CM | POA: Diagnosis not present

## 2021-11-16 DIAGNOSIS — L578 Other skin changes due to chronic exposure to nonionizing radiation: Secondary | ICD-10-CM | POA: Diagnosis not present

## 2021-11-16 DIAGNOSIS — L570 Actinic keratosis: Secondary | ICD-10-CM | POA: Diagnosis not present

## 2021-11-16 DIAGNOSIS — Z08 Encounter for follow-up examination after completed treatment for malignant neoplasm: Secondary | ICD-10-CM | POA: Diagnosis not present

## 2021-11-16 DIAGNOSIS — L821 Other seborrheic keratosis: Secondary | ICD-10-CM | POA: Diagnosis not present

## 2021-11-16 DIAGNOSIS — L814 Other melanin hyperpigmentation: Secondary | ICD-10-CM | POA: Diagnosis not present

## 2021-11-16 DIAGNOSIS — Z85828 Personal history of other malignant neoplasm of skin: Secondary | ICD-10-CM | POA: Diagnosis not present

## 2021-11-16 DIAGNOSIS — D1801 Hemangioma of skin and subcutaneous tissue: Secondary | ICD-10-CM | POA: Diagnosis not present

## 2021-11-16 DIAGNOSIS — D225 Melanocytic nevi of trunk: Secondary | ICD-10-CM | POA: Diagnosis not present

## 2021-11-24 DIAGNOSIS — R338 Other retention of urine: Secondary | ICD-10-CM | POA: Diagnosis not present

## 2021-11-24 DIAGNOSIS — Z466 Encounter for fitting and adjustment of urinary device: Secondary | ICD-10-CM | POA: Diagnosis not present

## 2021-11-25 ENCOUNTER — Encounter: Payer: Self-pay | Admitting: Internal Medicine

## 2021-12-01 ENCOUNTER — Encounter: Payer: Self-pay | Admitting: Internal Medicine

## 2021-12-09 ENCOUNTER — Telehealth: Payer: Self-pay

## 2021-12-09 NOTE — Telephone Encounter (Signed)
Dr. Henrene Pastor,   This patient is scheduled for Monday 12/04 for Endo and his pre visit is 11/15.  Spoke with patient to confirm he is taking Eliquis.  His last procedure 08/21/20 and he held Eliquis for 2 days. Please advise if you would like an office visit.  Thank you Orlando Penner

## 2021-12-10 NOTE — Telephone Encounter (Signed)
Eugene Castaneda, Please have him hold Eliquis 2 days prior to his EGD. Thanks, Dr. Henrene Pastor

## 2021-12-13 NOTE — Telephone Encounter (Signed)
Noted on Pre Visit chart 

## 2021-12-15 ENCOUNTER — Ambulatory Visit (AMBULATORY_SURGERY_CENTER): Payer: Medicare Other

## 2021-12-15 VITALS — Ht 70.5 in | Wt 195.0 lb

## 2021-12-15 DIAGNOSIS — D132 Benign neoplasm of duodenum: Secondary | ICD-10-CM

## 2021-12-15 NOTE — Progress Notes (Signed)
Pre visit completed via phone call; Patient verified name, DOB, and address;  No egg or soy allergy known to patient  No issues known to pt with past sedation with any surgeries or procedures Patient denies ever being told they had issues or difficulty with intubation  No FH of Malignant Hyperthermia Pt is not on diet pills Pt is not on home 02  Pt IS ON blood thinners  AFIB with ELIQUIS- 2 day hold per Dr. Henrene Pastor- noted in chart Have any cardiac testing pending--NO  Pt denies issues with constipation   Insurance verified during [facility] appt=Medicare A/B with [place]  Patient's chart reviewed by Osvaldo Angst CNRA prior to previsit and patient appropriate for the [place].  Previsit completed and red dot placed by patient's name on their procedure day (on provider's schedule).    Patient has requested his instructions be mailed to him as well as being sent through [facility]

## 2021-12-20 DIAGNOSIS — R339 Retention of urine, unspecified: Secondary | ICD-10-CM | POA: Diagnosis not present

## 2021-12-20 DIAGNOSIS — Z466 Encounter for fitting and adjustment of urinary device: Secondary | ICD-10-CM | POA: Diagnosis not present

## 2021-12-27 ENCOUNTER — Encounter (HOSPITAL_BASED_OUTPATIENT_CLINIC_OR_DEPARTMENT_OTHER): Payer: Self-pay | Admitting: Cardiology

## 2021-12-27 ENCOUNTER — Encounter (HOSPITAL_BASED_OUTPATIENT_CLINIC_OR_DEPARTMENT_OTHER): Payer: Self-pay

## 2021-12-27 ENCOUNTER — Ambulatory Visit (INDEPENDENT_AMBULATORY_CARE_PROVIDER_SITE_OTHER): Payer: Medicare Other | Admitting: Cardiology

## 2021-12-27 VITALS — BP 124/68 | HR 54 | Ht 70.5 in | Wt 201.4 lb

## 2021-12-27 DIAGNOSIS — I2510 Atherosclerotic heart disease of native coronary artery without angina pectoris: Secondary | ICD-10-CM

## 2021-12-27 DIAGNOSIS — Z7901 Long term (current) use of anticoagulants: Secondary | ICD-10-CM | POA: Diagnosis not present

## 2021-12-27 DIAGNOSIS — I10 Essential (primary) hypertension: Secondary | ICD-10-CM | POA: Diagnosis not present

## 2021-12-27 DIAGNOSIS — I480 Paroxysmal atrial fibrillation: Secondary | ICD-10-CM | POA: Diagnosis not present

## 2021-12-27 DIAGNOSIS — D6869 Other thrombophilia: Secondary | ICD-10-CM | POA: Diagnosis not present

## 2021-12-27 DIAGNOSIS — E782 Mixed hyperlipidemia: Secondary | ICD-10-CM

## 2021-12-27 NOTE — Progress Notes (Signed)
Cardiology Office Note:    Date:  12/27/2021   ID:  Eugene Castaneda, DOB 1945-04-05, MRN [id number]  PCP:  [name], MD  Cardiologist:  Buford Dresser, MD   Referring MD: [name], MD   CC: Follow up  History of Present Illness:    Eugene Castaneda is a 76 y.o. male with a hx of paroxysmal atrial fibrillation, hypertension, CAD without angina, sick sinus syndrome, hypothyroidism, hyperlipidemia who is seen for follow up. I initially saw him 11/27/2017  as a new patient to [facility] (previously followed by [name]) at the request of [name], [name], MD for the evaluation and management of paroxysmal afib.   Cardiac history: paroxysmal afib, on flecainide "for many years," reports prior treadmill tests that I do not have access to. Has declined anticoagulation in the past but was amenable to starting 01/2019. Uses [employer] intermittently to evaluate for afib.  He followed up with Dr. Rayann Heman 02/17/2021, where he was doing well and continued on flecainide due to rare breakthrough events.  At his last visit he reported recent Afib episodes of several minutes long, but they were not physically limiting. At home blood pressures were usually in the 120-130/60-70 range. Previously his statin had been increased to 10 mg as his LDL was 81. It was 68 at that visit.  He was seen by Malka So, PA on 08/16/2021 where his EKG showed sinus bradycardia at 58 bpm with 1st degree AV block. He was continued on flecainide and Eliquis.  Today, he is feeling fine with no new cardiovascular complaints. Since his last visit he has not been aware of any arrhythmic episodes. He remains compliant with flecainide.  For exercise he swims 3 days a week. He also works out at the [employer] 3 days a week. Recently he also has been raking leaves with no anginal symptoms.  No bleeding issues on Eliquis. He recently underwent a root canal and tooth implant.  He denies any  palpitations, chest pain, shortness of breath, or peripheral edema. No lightheadedness, headaches, syncope, orthopnea, or PND.  Labs from 10/06/21 with Dr. Dagmar Hait show stable CBC (Hgb 14.6), Cr 1.2, K 4.8   Past Medical History:  Diagnosis Date   Anemia    on meds   CAD (coronary artery disease), native coronary artery 11/13/2017   Coronary calcification noted on CT scan    Chronic kidney disease (CKD), stage III (moderate) ([place]) 11/13/2017   has suprapubic catheter   Diplopia    Foley catheter present    pt has supra-pubic foley catheter in place/since 2016   GERD (gastroesophageal reflux disease)    on meds   Hyperlipemia    on meds   Hypertension    on meds   Hypertensive heart disease without CHF 11/13/2017   Hypothyroidism    on meds   Impaired fasting glucose    MG (myasthenia gravis) ([place])    Paroxysmal atrial fibrillation ([place]) 11/13/2017   RF ablation Dr. 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   has supra-pubic foley catheter put in 2016/sphincter removed in 2017 replaced with the catheter   [place] N/A 07/23/2019   Procedure: [place];  Surgeon: [name], MD;  Location: [place] [facility];  Service: Cardiovascular;  Laterality: N/A;   ATRIAL FIBRILLATION ABLATION N/A 02/06/2020   Procedure: ATRIAL FIBRILLATION ABLATION;  Surgeon: [name], MD;  Location: [place] [facility];  Service: Cardiovascular;  Laterality: N/A;   catheter ablationfor afib  2010   COLONOSCOPY  2022   PROSTATECTOMY  2009   TONSILLECTOMY      Current Medications: Current Outpatient Medications on File Prior to Visit  Medication Sig   apixaban (ELIQUIS) 5 MG TABS tablet Take 1 tablet by mouth twice daily   azaTHIOprine (IMURAN) 50 MG  tablet Take 1 tablet (50 mg total) by mouth 2 (two) times daily.   cholecalciferol (VITAMIN D3) 25 MCG (1000 UNIT) tablet Take 1,000 Units by mouth daily. 1 Tablet Daily   clotrimazole-betamethasone (LOTRISONE) cream Apply 1 application topically 2 (two) times daily as needed (irritation).   CRANBERRY PO Take 1 tablet by mouth daily. 650 mg daily   Cyanocobalamin 5000 MCG CAPS Take 1 capsule by mouth daily.   ferrous sulfate 325 (65 FE) MG EC tablet Take 325 mg by mouth 2 (two) times daily.   Fish Oil OIL Take 1,000 mg by mouth daily.    flecainide (TAMBOCOR) 50 MG tablet Take 1 tablet by mouth twice daily   gluconic acid-citric acid (RENACIDIN) irrigation Irrigate with 30 mLs as directed every other day.   Incontinence Supplies (CATHETER EXTENSION TUBING) MISC Inject 1 fluid ounce into the vein as needed. Use as directed   Leuprolide Acetate (LUPRON IJ) Inject as directed as needed (Prostate cancer).    levothyroxine (SYNTHROID, LEVOTHROID) 150 MCG tablet Take 150 mcg by mouth daily before breakfast.   lisinopril (PRINIVIL,ZESTRIL) 20 MG tablet Take 20 mg by mouth 2 (two) times a day.    Multiple Vitamin (MULTIVITAMIN) tablet Take 1 tablet by mouth daily.   omeprazole (PRILOSEC) 20 MG capsule Take 20 mg by mouth daily.    rosuvastatin (CRESTOR) 10 MG tablet Take 10 mg by mouth daily.   vitamin C (ASCORBIC ACID) 500 MG tablet Take 500 mg by mouth daily.   Water For Irrigation, Sterile (STERILE WATER FOR IRRIGATION) Irrigate with 1,000,000 mLs as directed as needed. Use as directed   mirabegron ER (MYRBETRIQ) 50 MG TB24 tablet Take 50 mg by mouth as needed.   No current facility-administered medications on file prior to visit.     Allergies:   Patient has no known allergies.   Social History   Tobacco Use   Smoking status: Former    Types: Cigarettes    Quit date: 02/01/1980    Years since quitting: 41.9   Smokeless tobacco: Never  Vaping Use   Vaping Use: Never used  Substance Use  Topics   Alcohol use: Not Currently    Alcohol/week: 0.0 - 4.0 standard drinks of alcohol    Comment: 2 glasses red wine daily   Drug use: No    Family History: The patient's family history includes Breast cancer in his mother; Parkinson's disease in his father. There is no history of Colon cancer, Esophageal cancer, Rectal cancer, Colon polyps, or Stomach cancer.  ROS:   Please see the history of present illness.   Additional pertinent ROS otherwise unremarkable.   EKGs/Labs/Other Studies Reviewed:    The following studies were reviewed today:  Afib Ablation 02/06/2020: 1. Sinus rhythm upon presentation.   2. Intracardiac echo reveals a moderate sized left atrium with four separate pulmonary veins without evidence of pulmonary vein stenosis. 3. All four pulmonary veins were quiescent from the prior ablations.  There was a minor amount of electrical activity along the posterior bottom portion of the right inferior pulmonary vein.   4. Right inferior pulmonary vein isolation achieved with ablation.  5. Additional left atrial ablation was performed with a standard box lesion created along the posterior wall of the left atrium 6.   No arrhythmias were induced during isuprel infusion.  No PACs, PVCs, atrial tachycardia, atrial flutter, or other arrhythmias were observed today. 7. CTI block confirmed from a prior ablation 8. No early apparent complications.  ETT 12/17/2019:  Blood pressure demonstrated a normal response to exercise. There was no ST segment deviation noted during stress.   ETT with fair exercise tolerance (6:43); no chest pain; normal blood pressure response; no diagnostic ST changes; negative adequate exercise tolerance test.  Afib Ablation 07/23/2019: 1. Sinus rhythm upon presentation.   2. Intracardiac echo reveals a normal sized left atrium with four separate pulmonary veins without evidence of pulmonary vein stenosis. 3.  The left pulmonary veins were quiescent from  the prior ablation and did not require additional ablation today.  There was minor return of electrical activity along the anterior carina between the right superior and inferior pulmonary veins which was successfully ablated today. 4. Ectopic atrial tachycardia successfully ablated along the anteroseptal right atrium 5. Isthmus dependant atrial flutter successfully ablated along the CTI with isthmus block achieved 6. Atrial fibrillation also observed during isuprel with multiple atypical atrial flutter circuits observed, not suitable for mapping or ablation today. 7. Successful cardioversion to sinus rhythm 8. No early apparent complications.  Echo 07/03/2019: 1. Left ventricular ejection fraction, by estimation, is 60 to 65%. The  left ventricle has normal function. The left ventricle has no regional  wall motion abnormalities. Left ventricular diastolic parameters are  consistent with Grade I diastolic  dysfunction (impaired relaxation). The average left ventricular global  longitudinal strain is -19.6 %. The global longitudinal strain is normal.   2. Right ventricular systolic function is normal. The right ventricular  size is mildly enlarged. There is normal pulmonary artery systolic  pressure. The estimated right ventricular systolic pressure is 62.9 mmHg.   3. The mitral valve is normal in structure. Trivial mitral valve  regurgitation. No evidence of mitral stenosis.   4. The aortic valve is tricuspid. Aortic valve regurgitation is trivial.  Mild aortic valve sclerosis is present, with no evidence of aortic valve  stenosis. Aortic regurgitation PHT measures 1615 msec.   5. Aortic dilatation noted. There is mild dilatation of the ascending  aorta measuring 38 mm.   6. The inferior vena cava is normal in size with greater than 50%  respiratory variability, suggesting right atrial pressure of 3 mmHg.   Per Dr. Thurman Coyer notes: treadmill myoview, 04/18/2013.  He did 9 minutes, up to  stage II Bruce, for a workload total of 10 METs. Heart rate went from 62 bpm to 144 bpm. No significant ST changes or arrhythmia noted. Imaging suggests a low risk study, with inferolateral ischemia vs more likely artifact, EF 78%.  Echo 10/12/2015 Mild cLVH with normal WM. EF 55% Moderate LAE  Mild RAE Trace AR Mild MR Trace TR and PR IVC dilated, <50% RC  RF ablation 03/2008 Treadmill 05/2003, 03/2008, 03/2013 Echo 03/2003  EKG:  EKG is personally reviewed. 12/27/2021:  sinus bradycardia with 1st degree AV block at 54 bpm, iRBBB 06/25/2021: sinus bradycardia with 1st degree AV block at 57 bpm, iRBBB 06/17/2020: Sinus rhythm, bradycardic, rate 58 bpm, iRBBB 10/23/19: sinus rhythm, 1st  degree AV block, incomplete RBBB at rate of 63 bpm.  Recent Labs: No results found for requested labs within last 365 days.   Recent Lipid Panel No results found for: "CHOL", "TRIG", "HDL", "CHOLHDL", "VLDL", "LDLCALC", "LDLDIRECT"   Physical Exam:    VS:  BP 124/68   Pulse (!) 54   Ht 5' 10.5" (1.791 m)   Wt 201 lb 6.4 oz (91.4 kg)   SpO2 98%   BMI 28.49 kg/m     Wt Readings from Last 3 Encounters:  12/27/21 201 lb 6.4 oz (91.4 kg)  12/15/21 195 lb (88.5 kg)  10/28/21 203 lb 5 oz (92.2 kg)    GEN: Well nourished, well developed in no acute distress HEENT: Normal, moist mucous membranes NECK: No JVD CARDIAC: regular rhythm, normal S1 and S2, no rubs or gallops. No murmur. VASCULAR: Radial and DP pulses 2+ bilaterally. No carotid bruits RESPIRATORY:  Clear to auscultation without rales, wheezing or rhonchi  ABDOMEN: Soft, non-tender, non-distended MUSCULOSKELETAL:  Ambulates independently SKIN: Warm and dry, no edema NEUROLOGIC:  Alert and oriented x 3. No focal neuro deficits noted. PSYCHIATRIC:  Normal affect    ASSESSMENT:    1. Paroxysmal atrial fibrillation (HCC)   2. Secondary hypercoagulable state ([place])   3. Long term current use of anticoagulant   4. Essential hypertension   5.  Nonocclusive coronary atherosclerosis of native coronary artery   6. Mixed hyperlipidemia    PLAN:    Paroxysmal atrial fibrillation (HCC) -s/p ablation but had recurrent afib, restarted flecainide. We have discussed risk of flecainide with ischemia. Denies ischemic symptoms, discussed what to watch for  -no recent symptomatic episodes -CHA2DS2/VAS Stroke Risk Points = 5   -continue apixaban, denies any bleeding issues    Mixed hyperlipidemia Discussed in the context of coronary calcium. Last LDL 68, goal <70.  -continue rosuvastatin   CAD (coronary artery disease), native coronary artery Swims for >1 hour three times/week without limitations. No angina. On statin, LDL at goal. No aspirin. -see above re: discussion of flecainide. He has discussed with Dr. Rayann Heman. Last ETT unremarkable.   Essential hypertension Well controlled, continue lisinopril.  CV risk and prevention counseling: -recommend heart healthy/Mediterranean diet, with whole grains, fruits, vegetable, fish, lean meats, nuts, and olive oil. Limit salt. -recommend moderate walking, 3-5 times/week for 30-50 minutes each session. Aim for at least 150 minutes.week. Goal should be pace of 3 miles/hours, or walking 1.5 miles in 30 minutes -recommend avoidance of tobacco products. Avoid excess alcohol.  Plan for follow up: 6 mos or sooner with me as needed.  Buford Dresser, MD, PhD [place]  [facility]   Medication Adjustments/Labs and Tests Ordered: Current medicines are reviewed at length with the patient today.  Concerns regarding medicines are outlined above.   Orders Placed This Encounter  Procedures   EKG 12-Lead   No orders of the defined types were placed in this encounter.  Patient Instructions  Medication Instructions:  No change *If you need a refill on your cardiac medications before your next appointment, please call your pharmacy*   Lab Work: None today  If you have labs (blood work)  drawn today and your tests are completely normal, you will receive your results only by: [place] (if you have MyChart) OR A paper copy in the mail If you have any lab test that is abnormal or we need to change your treatment, we will call you to review the results.   Testing/Procedures: None  Follow-Up: At [facility], you and your health needs are our priority.  As part of our continuing mission to provide you with exceptional heart care, we have created designated Provider Care Teams.  These Care Teams include your primary Cardiologist (physician) and Advanced Practice Providers (APPs -  Physician Assistants and Nurse Practitioners) who all work together to provide you with the care you need, when you need it.  We recommend signing up for the patient portal called "MyChart".  Sign up information is provided on this After Visit Summary.  MyChart is used to connect with patients for Virtual Visits (Telemedicine).  Patients are able to view lab/test results, encounter notes, upcoming appointments, etc.  Non-urgent messages can be sent to your provider as well.   To learn more about what you can do with MyChart, go to [email or web address].    Your next appointment:   6 month(s)  The format for your next appointment:   In Person  Provider:   Buford Dresser, MD    Other Instructions Please call sooner if needed    [facility],acting as a scribe for Buford Dresser, MD.,have documented all relevant documentation on the behalf of Buford Dresser, MD,as directed by  Buford Dresser, MD while in the presence of Buford Dresser, MD.  I, Buford Dresser, MD, have reviewed all documentation for this visit. The documentation on 12/27/21 for the exam, diagnosis, procedures, and orders are all accurate and complete.  Signed, Buford Dresser, MD PhD 12/27/2021 11:24 AM    [place]

## 2021-12-27 NOTE — Patient Instructions (Signed)
Medication Instructions:  No change *If you need a refill on your cardiac medications before your next appointment, please call your pharmacy*   Lab Work: None today  If you have labs (blood work) drawn today and your tests are completely normal, you will receive your results only by: [place] (if you have MyChart) OR A paper copy in the mail If you have any lab test that is abnormal or we need to change your treatment, we will call you to review the results.   Testing/Procedures: None   Follow-Up: At [facility], you and your health needs are our priority.  As part of our continuing mission to provide you with exceptional heart care, we have created designated Provider Care Teams.  These Care Teams include your primary Cardiologist (physician) and Advanced Practice Providers (APPs -  Physician Assistants and Nurse Practitioners) who all work together to provide you with the care you need, when you need it.  We recommend signing up for the patient portal called "MyChart".  Sign up information is provided on this After Visit Summary.  MyChart is used to connect with patients for Virtual Visits (Telemedicine).  Patients are able to view lab/test results, encounter notes, upcoming appointments, etc.  Non-urgent messages can be sent to your provider as well.   To learn more about what you can do with MyChart, go to [email or web address].    Your next appointment:   6 month(s)  The format for your next appointment:   In Person  Provider:   Buford Dresser, MD    Other Instructions Please call sooner if needed

## 2021-12-30 DIAGNOSIS — Z9359 Other cystostomy status: Secondary | ICD-10-CM | POA: Diagnosis not present

## 2021-12-30 DIAGNOSIS — R339 Retention of urine, unspecified: Secondary | ICD-10-CM | POA: Diagnosis not present

## 2021-12-30 DIAGNOSIS — N35013 Post-traumatic anterior urethral stricture: Secondary | ICD-10-CM | POA: Diagnosis not present

## 2021-12-30 DIAGNOSIS — N529 Male erectile dysfunction, unspecified: Secondary | ICD-10-CM | POA: Diagnosis not present

## 2021-12-30 DIAGNOSIS — C61 Malignant neoplasm of prostate: Secondary | ICD-10-CM | POA: Diagnosis not present

## 2021-12-31 ENCOUNTER — Other Ambulatory Visit (HOSPITAL_BASED_OUTPATIENT_CLINIC_OR_DEPARTMENT_OTHER): Payer: Self-pay

## 2021-12-31 DIAGNOSIS — Z23 Encounter for immunization: Secondary | ICD-10-CM | POA: Diagnosis not present

## 2021-12-31 MED ORDER — COVID-19 MRNA VAC-TRIS(PFIZER) 30 MCG/0.3ML IM SUSY
PREFILLED_SYRINGE | INTRAMUSCULAR | 0 refills | Status: DC
  Filled 2021-12-31: qty 0.3, 1d supply, fill #0

## 2022-01-03 ENCOUNTER — Other Ambulatory Visit (HOSPITAL_BASED_OUTPATIENT_CLINIC_OR_DEPARTMENT_OTHER): Payer: Self-pay

## 2022-01-03 ENCOUNTER — Encounter: Payer: Self-pay | Admitting: Internal Medicine

## 2022-01-03 ENCOUNTER — Ambulatory Visit (AMBULATORY_SURGERY_CENTER): Payer: Medicare Other | Admitting: Internal Medicine

## 2022-01-03 VITALS — BP 107/67 | HR 61 | Temp 96.2°F | Resp 18 | Ht 70.5 in | Wt 195.0 lb

## 2022-01-03 DIAGNOSIS — D132 Benign neoplasm of duodenum: Secondary | ICD-10-CM | POA: Diagnosis not present

## 2022-01-03 DIAGNOSIS — Z8719 Personal history of other diseases of the digestive system: Secondary | ICD-10-CM

## 2022-01-03 DIAGNOSIS — K449 Diaphragmatic hernia without obstruction or gangrene: Secondary | ICD-10-CM | POA: Diagnosis not present

## 2022-01-03 DIAGNOSIS — K219 Gastro-esophageal reflux disease without esophagitis: Secondary | ICD-10-CM | POA: Diagnosis not present

## 2022-01-03 DIAGNOSIS — K222 Esophageal obstruction: Secondary | ICD-10-CM

## 2022-01-03 DIAGNOSIS — D509 Iron deficiency anemia, unspecified: Secondary | ICD-10-CM

## 2022-01-03 MED ORDER — SODIUM CHLORIDE 0.9 % IV SOLN: 500.0000 mL | Freq: Once | INTRAVENOUS | Status: DC

## 2022-01-03 NOTE — Progress Notes (Signed)
Sedate, gd SR, tolerated procedure well, VSS, report to RN 

## 2022-01-03 NOTE — Progress Notes (Signed)
Called to room to assist during endoscopic procedure.  Patient ID and intended procedure confirmed with present staff. Received instructions for my participation in the procedure from the performing physician.  

## 2022-01-03 NOTE — Progress Notes (Signed)
HISTORY OF PRESENT ILLNESS:   Eugene Castaneda is a 76 y.o. male with multiple medical problems as listed below who was evaluated May 13, 2020 regarding normocytic anemia and Hemoccult positive stool.  See that dictation.  He also has a history of chronic GERD and adenomatous colon polyps.  He is on chronic Eliquis therapy for atrial fibrillation status post ablation x2.  He was placed on iron therapy due to low ferritin level.  He subsequently underwent colonoscopy and upper endoscopy August 12, 2020.  Colonoscopy revealed a diminutive transverse colon polyp, left-sided diverticulosis, and internal hemorrhoids.  No other abnormalities.  Follow-up in 5 years to be considered.  Upper endoscopy revealed mild esophagitis and antral erythema.  Biopsies were unremarkable.  He did have a duodenal polyp that was removed with a snare polypectomy.  This returned showing a duodenal adenoma.  This was felt to be completely resected.  He has continued on iron twice daily.  He has no GI complaints.  Most recent blood work from November 12, 2020 shows normal hemoglobin of 13.1 and normal ferritin level of 35.   REVIEW OF SYSTEMS:   All non-GI ROS negative unless otherwise stated in the HPI except for back pain, urinary leakage       Past Medical History:  Diagnosis Date   CAD (coronary artery disease), native coronary artery 11/13/2017    Coronary calcification noted on CT scan    Chronic kidney disease (CKD), stage III (moderate) ([place]) 11/13/2017   Diplopia     Foley catheter present      pt has supra-pubic foley catheter in place/since 2016   GERD (gastroesophageal reflux disease)     Hyperlipemia     Hypertension     Hypertensive heart disease without CHF 11/13/2017   Hypothyroidism     Impaired fasting glucose     MG (myasthenia gravis) ([place])     Paroxysmal atrial fibrillation ([place]) 11/13/2017    RF ablation Dr. [name] 2010 [facility] CHADSVASC 3   Prostate cancer [facility]) 2009   Urethral  obstruction     Urinary retention      Suprapubic Catheter placed 04/25/15           Past Surgical History:  Procedure Laterality Date   Artificial Urinary Sphincter   09/2014    has supra-pubic foley catheter put in 2016/sphincter removed in 2017 replaced with the catheter   [place] N/A 07/23/2019    Procedure: ATRIAL FIBRILLATION ABLATION;  Surgeon: [name], MD;  Location: [place] [facility];  Service: Cardiovascular;  Laterality: N/A;   ATRIAL FIBRILLATION ABLATION N/A 02/06/2020    Procedure: ATRIAL FIBRILLATION ABLATION;  Surgeon: [name], MD;  Location: [place] [facility] LAB;  Service: Cardiovascular;  Laterality: N/A;   catheter ablationfor afib   2010   COLONOSCOPY       PROSTATECTOMY   2009   TONSILLECTOMY          Social History TAMMY ERICSSON  reports that he quit smoking about 40 years ago. His smoking use included cigarettes. He has never used smokeless tobacco. He reports current alcohol use. He reports that he does not use drugs.   family history includes Breast cancer in his mother; Parkinson's disease in his father.   No Known Allergies       PHYSICAL EXAMINATION: Vital signs: BP 122/64   Pulse (!) 50   Ht '5\' 11"'$  (1.803 m)   Wt 191 lb 4 oz (86.8 kg)  SpO2 99%   BMI 26.67 kg/m   Constitutional: generally well-appearing, no acute distress Psychiatric: alert and oriented x3, cooperative Eyes: Anicteric Abdomen: Not reexamined Skin: no lesions on visible extremities no jaundice Neuro: No gross deficits   ASSESSMENT:   1.  Iron deficiency anemia.  Suspect nonspecific intermittent mucosal oozing from chronic anticoagulation therapy.  Extensive GI work-up as described.  Good response to iron with normalization of blood counts and iron levels. 2.  History of adenomatous colon polyps.  Surveillance up-to-date 3.  Incidental duodenal adenoma.  Removed 4.  GERD.  On PPI   PLAN:   1.  Continue daily iron supplementation 2.   Continue PPI 3.  Repeat EGD in 1 year to reevaluate the area where the small duodenal adenomatous polyp was removed..  I asked the nurse to place a recall in the computer system today. 4.  Consider colonoscopy in 5 years 5.  Interval GI follow-up as needed 6.  Resume general medical care with Dr. Dagmar Hait  He presents for surveillance upper endoscopy.  No complaints.

## 2022-01-03 NOTE — Patient Instructions (Signed)
   RESUME ELIQUIS TOMORROW (01-04-2022)  Await pathology results on duodenal polyp removed   Repeat Upper Endoscopy in one year   YOU HAD AN ENDOSCOPIC PROCEDURE TODAY AT [facility]:   Refer to the procedure report that was given to you for any specific questions about what was found during the examination.  If the procedure report does not answer your questions, please call your gastroenterologist to clarify.  If you requested that your care partner not be given the details of your procedure findings, then the procedure report has been included in a sealed envelope for you to review at your convenience later.  YOU SHOULD EXPECT: Some feelings of bloating in the abdomen. Passage of more gas than usual.  Walking can help get rid of the air that was put into your GI tract during the procedure and reduce the bloating. If you had a lower endoscopy (such as a colonoscopy or flexible sigmoidoscopy) you may notice spotting of blood in your stool or on the toilet paper. If you underwent a bowel prep for your procedure, you may not have a normal bowel movement for a few days.  Please Note:  You might notice some irritation and congestion in your nose or some drainage.  This is from the oxygen used during your procedure.  There is no need for concern and it should clear up in a day or so.  SYMPTOMS TO REPORT IMMEDIATELY:   Following upper endoscopy (EGD)  Vomiting of blood or coffee ground material  New chest pain or pain under the shoulder blades  Painful or persistently difficult swallowing  New shortness of breath  Fever of 100F or higher  Black, tarry-looking stools  For urgent or emergent issues, a gastroenterologist can be reached at any hour by calling [phone]. Do not use MyChart messaging for urgent concerns.    DIET:  We do recommend a small meal at first, but then you may proceed to your regular diet.  Drink plenty of fluids but you should avoid alcoholic  beverages for 24 hours.  ACTIVITY:  You should plan to take it easy for the rest of today and you should NOT DRIVE or use heavy machinery until tomorrow (because of the sedation medicines used during the test).    FOLLOW UP: Our staff will call the number listed on your records the next business day following your procedure.  We will call around 7:15- 8:00 am to check on you and address any questions or concerns that you may have regarding the information given to you following your procedure. If we do not reach you, we will leave a message.     If any biopsies were taken you will be contacted by phone or by letter within the next 1-3 weeks.  Please call us at [phone] if you have not heard about the biopsies in 3 weeks.    SIGNATURES/CONFIDENTIALITY: You and/or your care partner have signed paperwork which will be entered into your electronic medical record.  These signatures attest to the fact that that the information above on your After Visit Summary has been reviewed and is understood.  Full responsibility of the confidentiality of this discharge information lies with you and/or your care-partner.

## 2022-01-03 NOTE — Progress Notes (Signed)
Pt's states no medical or surgical changes since previsit or office visit. 

## 2022-01-03 NOTE — Op Note (Signed)
[facility] Patient Name: Eugene Castaneda Procedure Date: 01/03/2022 10:01 AM MRN: [id number] Endoscopist: Docia Chuck. 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 Now for routine follow-up Medicines:                Monitored Anesthesia Care Procedure:                Pre-Anesthesia Assessment:                           - Prior to the procedure, a History and Physical                            was performed, and patient medications and                            allergies were reviewed. The patient's tolerance of                            previous anesthesia was also reviewed. The risks                            and benefits of the procedure and the sedation                            options and risks were discussed with the patient.                            All questions were answered, and informed consent                            was obtained. Prior Anticoagulants: The patient has                            taken Eliquis (apixaban), last dose was 3 days                            prior to procedure. ASA Grade Assessment: III - A                            patient with severe systemic disease. After                            reviewing the risks and benefits, the patient was  deemed in satisfactory condition to undergo the                            procedure.                           After obtaining informed consent, the endoscope was                            passed under direct vision. Throughout the                            procedure, the patient's blood pressure, pulse, and                            oxygen saturations  were monitored continuously. The                            Endoscope was introduced through the mouth, and                            advanced to the second part of duodenum. The upper                            GI endoscopy was accomplished without difficulty.                            The patient tolerated the procedure well. Scope In: Scope Out: Findings:                 The esophagus revealed a large caliber distal                            esophageal ring/stricture..                           The stomach was normal save small hiatal hernia.                           The examined duodenum revealed a 3 mm sessile                            adenoma in the second portion. This was removed                            with snare polypectomy and submitted for pathology.                           The cardia and gastric fundus were normal on                            retroflexion. Complications:            No immediate complications. Estimated Blood Loss:     Estimated blood loss: none. Impression:  1. Incidental esophageal ring/stricture                           2. Small residual adenoma status post snare                            polypectomy. Recommendation:           - Patient has a contact number available for                            emergencies. The signs and symptoms of potential                            delayed complications were discussed with the                            patient. Return to normal activities tomorrow.                            Written discharge instructions were provided to the                            patient.                           - Resume previous diet.                           - Continue present medications                           -Resume Eliquis tomorrow                           -Repeat surveillance EGD in 1 year                           - Await pathology results. Docia Chuck. Henrene Pastor, MD 01/03/2022 10:28:10 AM This report has  been signed electronically.

## 2022-01-04 ENCOUNTER — Telehealth: Payer: Self-pay

## 2022-01-04 NOTE — Telephone Encounter (Signed)
  Follow up Call-     01/03/2022    8:58 AM 08/12/2020   12:53 PM  Call back number  Post procedure Call Back phone  # [phone]  Permission to leave phone message Yes Yes     Patient questions:  Do you have a fever, pain , or abdominal swelling? No. Pain Score  0 *  Have you tolerated food without any problems? Yes.    Have you been able to return to your normal activities? Yes.    Do you have any questions about your discharge instructions: Diet   No. Medications  No. Follow up visit  No.  Do you have questions or concerns about your Care? No.  Actions: * If pain score is 4 or above: No action needed, pain <4.

## 2022-01-05 ENCOUNTER — Encounter: Payer: Self-pay | Admitting: Internal Medicine

## 2022-01-12 ENCOUNTER — Other Ambulatory Visit (HOSPITAL_BASED_OUTPATIENT_CLINIC_OR_DEPARTMENT_OTHER): Payer: Self-pay

## 2022-01-12 DIAGNOSIS — L570 Actinic keratosis: Secondary | ICD-10-CM | POA: Diagnosis not present

## 2022-01-14 ENCOUNTER — Other Ambulatory Visit (HOSPITAL_BASED_OUTPATIENT_CLINIC_OR_DEPARTMENT_OTHER): Payer: Self-pay

## 2022-01-14 MED ORDER — AREXVY 120 MCG/0.5ML IM SUSR
INTRAMUSCULAR | 0 refills | Status: DC
  Filled 2022-01-14: qty 0.5, 1d supply, fill #0

## 2022-01-19 DIAGNOSIS — R339 Retention of urine, unspecified: Secondary | ICD-10-CM | POA: Diagnosis not present

## 2022-01-19 DIAGNOSIS — Z9359 Other cystostomy status: Secondary | ICD-10-CM | POA: Diagnosis not present

## 2022-02-07 DIAGNOSIS — L570 Actinic keratosis: Secondary | ICD-10-CM | POA: Diagnosis not present

## 2022-02-15 DIAGNOSIS — K219 Gastro-esophageal reflux disease without esophagitis: Secondary | ICD-10-CM | POA: Diagnosis not present

## 2022-02-15 DIAGNOSIS — R5383 Other fatigue: Secondary | ICD-10-CM | POA: Diagnosis not present

## 2022-02-15 DIAGNOSIS — I480 Paroxysmal atrial fibrillation: Secondary | ICD-10-CM | POA: Diagnosis not present

## 2022-02-15 DIAGNOSIS — I2510 Atherosclerotic heart disease of native coronary artery without angina pectoris: Secondary | ICD-10-CM | POA: Diagnosis not present

## 2022-02-15 DIAGNOSIS — Z9359 Other cystostomy status: Secondary | ICD-10-CM | POA: Diagnosis not present

## 2022-02-15 DIAGNOSIS — R7301 Impaired fasting glucose: Secondary | ICD-10-CM | POA: Diagnosis not present

## 2022-02-15 DIAGNOSIS — C61 Malignant neoplasm of prostate: Secondary | ICD-10-CM | POA: Diagnosis not present

## 2022-02-15 DIAGNOSIS — M549 Dorsalgia, unspecified: Secondary | ICD-10-CM | POA: Diagnosis not present

## 2022-02-15 DIAGNOSIS — I129 Hypertensive chronic kidney disease with stage 1 through stage 4 chronic kidney disease, or unspecified chronic kidney disease: Secondary | ICD-10-CM | POA: Diagnosis not present

## 2022-02-15 DIAGNOSIS — N1831 Chronic kidney disease, stage 3a: Secondary | ICD-10-CM | POA: Diagnosis not present

## 2022-02-15 DIAGNOSIS — E785 Hyperlipidemia, unspecified: Secondary | ICD-10-CM | POA: Diagnosis not present

## 2022-02-15 DIAGNOSIS — D126 Benign neoplasm of colon, unspecified: Secondary | ICD-10-CM | POA: Diagnosis not present

## 2022-02-16 DIAGNOSIS — R338 Other retention of urine: Secondary | ICD-10-CM | POA: Diagnosis not present

## 2022-02-16 DIAGNOSIS — Z466 Encounter for fitting and adjustment of urinary device: Secondary | ICD-10-CM | POA: Diagnosis not present

## 2022-03-03 ENCOUNTER — Other Ambulatory Visit: Payer: Self-pay | Admitting: Cardiology

## 2022-03-03 DIAGNOSIS — I480 Paroxysmal atrial fibrillation: Secondary | ICD-10-CM

## 2022-03-03 NOTE — Telephone Encounter (Signed)
Prescription refill request for Eliquis received. Indication: Afib  Last office visit: 12/27/21 Harrell Gave)  Scr: 1.1 (06/08/21 via [facility])  Age: 77 Weight: 88.5kg  Appropriate dose. Refill sent.

## 2022-03-03 NOTE — Telephone Encounter (Signed)
Please review for refill. Thank you! 

## 2022-03-09 ENCOUNTER — Other Ambulatory Visit: Payer: Self-pay

## 2022-03-09 MED ORDER — FLECAINIDE ACETATE 50 MG PO TABS: 50.0000 mg | ORAL_TABLET | Freq: Two times a day (BID) | ORAL | 3 refills | Status: DC

## 2022-03-10 ENCOUNTER — Encounter (HOSPITAL_COMMUNITY): Payer: Self-pay | Admitting: *Deleted

## 2022-03-16 DIAGNOSIS — Z9359 Other cystostomy status: Secondary | ICD-10-CM | POA: Diagnosis not present

## 2022-03-16 DIAGNOSIS — R339 Retention of urine, unspecified: Secondary | ICD-10-CM | POA: Diagnosis not present

## 2022-03-22 DIAGNOSIS — L111 Transient acantholytic dermatosis [Grover]: Secondary | ICD-10-CM | POA: Diagnosis not present

## 2022-03-22 DIAGNOSIS — L821 Other seborrheic keratosis: Secondary | ICD-10-CM | POA: Diagnosis not present

## 2022-03-22 DIAGNOSIS — L570 Actinic keratosis: Secondary | ICD-10-CM | POA: Diagnosis not present

## 2022-03-22 DIAGNOSIS — L718 Other rosacea: Secondary | ICD-10-CM | POA: Diagnosis not present

## 2022-03-30 ENCOUNTER — Other Ambulatory Visit: Payer: Self-pay | Admitting: Neurology

## 2022-03-31 DIAGNOSIS — C61 Malignant neoplasm of prostate: Secondary | ICD-10-CM | POA: Diagnosis not present

## 2022-04-13 DIAGNOSIS — Z466 Encounter for fitting and adjustment of urinary device: Secondary | ICD-10-CM | POA: Diagnosis not present

## 2022-04-13 DIAGNOSIS — R339 Retention of urine, unspecified: Secondary | ICD-10-CM | POA: Diagnosis not present

## 2022-04-14 ENCOUNTER — Ambulatory Visit (HOSPITAL_COMMUNITY)
Admission: RE | Admit: 2022-04-14 | Discharge: 2022-04-14 | Disposition: A | Discharge status: Home / Self Care | Payer: Medicare Other | Source: Ambulatory Visit | Attending: Physician Assistant | Admitting: Physician Assistant

## 2022-04-14 ENCOUNTER — Encounter (HOSPITAL_COMMUNITY): Payer: Self-pay | Admitting: Physician Assistant

## 2022-04-14 VITALS — BP 116/62 | HR 57 | Ht 70.5 in | Wt 202.0 lb

## 2022-04-14 DIAGNOSIS — N183 Chronic kidney disease, stage 3 unspecified: Secondary | ICD-10-CM | POA: Insufficient documentation

## 2022-04-14 DIAGNOSIS — I2510 Atherosclerotic heart disease of native coronary artery without angina pectoris: Secondary | ICD-10-CM | POA: Diagnosis not present

## 2022-04-14 DIAGNOSIS — I480 Paroxysmal atrial fibrillation: Secondary | ICD-10-CM | POA: Diagnosis not present

## 2022-04-14 DIAGNOSIS — Z87891 Personal history of nicotine dependence: Secondary | ICD-10-CM | POA: Diagnosis not present

## 2022-04-14 DIAGNOSIS — D6869 Other thrombophilia: Secondary | ICD-10-CM | POA: Insufficient documentation

## 2022-04-14 DIAGNOSIS — E039 Hypothyroidism, unspecified: Secondary | ICD-10-CM | POA: Diagnosis not present

## 2022-04-14 DIAGNOSIS — Z79899 Other long term (current) drug therapy: Secondary | ICD-10-CM | POA: Insufficient documentation

## 2022-04-14 DIAGNOSIS — E785 Hyperlipidemia, unspecified: Secondary | ICD-10-CM | POA: Diagnosis not present

## 2022-04-14 DIAGNOSIS — I129 Hypertensive chronic kidney disease with stage 1 through stage 4 chronic kidney disease, or unspecified chronic kidney disease: Secondary | ICD-10-CM | POA: Insufficient documentation

## 2022-04-14 DIAGNOSIS — Z7901 Long term (current) use of anticoagulants: Secondary | ICD-10-CM | POA: Insufficient documentation

## 2022-04-14 LAB — CBC
HCT: 42.6 % (ref 39.0–52.0)
Hemoglobin: 14.3 g/dL (ref 13.0–17.0)
MCH: 33.7 pg (ref 26.0–34.0)
MCHC: 33.6 g/dL (ref 30.0–36.0)
MCV: 100.5 fL — ABNORMAL HIGH (ref 80.0–100.0)
Platelets: 224 10*3/uL (ref 150–400)
RBC: 4.24 MIL/uL (ref 4.22–5.81)
RDW: 12.6 % (ref 11.5–15.5)
WBC: 5.3 10*3/uL (ref 4.0–10.5)
nRBC: 0 % (ref 0.0–0.2)

## 2022-04-14 LAB — BASIC METABOLIC PANEL
Anion gap: 10 (ref 5–15)
BUN: 13 mg/dL (ref 8–23)
CO2: 23 mmol/L (ref 22–32)
Calcium: 9.5 mg/dL (ref 8.9–10.3)
Chloride: 106 mmol/L (ref 98–111)
Creatinine, Ser: 1.29 mg/dL — ABNORMAL HIGH (ref 0.61–1.24)
GFR, Estimated: 57 mL/min — ABNORMAL LOW (ref >60)
Glucose, Bld: 132 mg/dL — ABNORMAL HIGH (ref 70–99)
Potassium: 4.5 mmol/L (ref 3.5–5.1)
Sodium: 139 mmol/L (ref 135–145)

## 2022-04-14 LAB — HEPATIC FUNCTION PANEL
ALT: 27 U/L (ref 0–44)
AST: 31 U/L (ref 15–41)
Albumin: 4.2 g/dL (ref 3.5–5.0)
Alkaline Phosphatase: 51 U/L (ref 38–126)
Bilirubin, Direct: 0.1 mg/dL (ref 0.0–0.2)
Indirect Bilirubin: 0.7 mg/dL (ref 0.3–0.9)
Total Bilirubin: 0.8 mg/dL (ref 0.3–1.2)
Total Protein: 7.1 g/dL (ref 6.5–8.1)

## 2022-04-14 MED ORDER — MULTAQ 400 MG PO TABS: 400.0000 mg | ORAL_TABLET | Freq: Two times a day (BID) | ORAL | 3 refills | Status: DC

## 2022-04-14 NOTE — Addendum Note (Signed)
Encounter addended by: Juluis Mire, RN on: 04/14/2022 9:55 AM  Actions taken: Order list changed, Diagnosis association updated

## 2022-04-14 NOTE — Progress Notes (Signed)
Primary Care Physician: [name], MD Referring Physician: Dr. Rayann Heman  Primary Cardiologist: Dr Emily Filbert is a 77 y.o. male with a h/o paroxysmal afib, CAD, CKD, HLD, HTN, hypothyroidism who presents for follow up in the [facility]. Initial afib ablation several years ago with Dr. [name] in [place]. He has been maintained on flecainide without any daily AV nodal agents.   On follow up today, patient reports that overall he has done well since his last visit. He has very brief palpitations but has not seen any afib on his Kardia mobile device. No bleeding issues on anticoagulation.   T oday, he denies symptoms of chest pain, shortness of breath, orthopnea, PND, lower extremity edema, dizziness, presyncope, syncope, or neurologic sequela. The patient is tolerating medications without difficulties and is otherwise without complaint today.   Past Medical History:  Diagnosis Date   Anemia    on meds   CAD (coronary artery disease), native coronary artery 11/13/2017   Coronary calcification noted on CT scan    Chronic kidney disease (CKD), stage III (moderate) ([facility]) 11/13/2017   has suprapubic catheter   Diplopia    Foley catheter present    pt has supra-pubic foley catheter in place/since 2016   GERD (gastroesophageal reflux disease)    on meds   Hyperlipemia    on meds   Hypertension    on meds   Hypertensive heart disease without CHF 11/13/2017   Hypothyroidism    on meds   Impaired fasting glucose    MG (myasthenia gravis) ([facility])    Paroxysmal atrial fibrillation ([place]) 11/13/2017   RF ablation Dr. 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   has supra-pubic foley catheter put in 2016/sphincter removed in 2017 replaced  with the catheter   [place] N/A 07/23/2019   Procedure: [place];  Surgeon: [name], MD;  Location: [facility];  Service: Cardiovascular;  Laterality: N/A;   ATRIAL FIBRILLATION ABLATION N/A 02/06/2020   Procedure: ATRIAL FIBRILLATION ABLATION;  Surgeon: [name], MD;  Location: [facility];  Service: Cardiovascular;  Laterality: N/A;   catheter ablationfor afib  2010   COLONOSCOPY  2022   PROSTATECTOMY  2009   TONSILLECTOMY      Current Outpatient Medications  Medication Sig Dispense Refill   apixaban (ELIQUIS) 5 MG TABS tablet Take 1 tablet by mouth twice daily 180 tablet 1   azaTHIOprine (IMURAN) 50 MG tablet Take 1 tablet (50 mg total) by mouth 2 (two) times daily. 180 tablet 3   cholecalciferol (VITAMIN D3) 25 MCG (1000 UNIT) tablet Take 1,000 Units by mouth daily. 1 Tablet Daily     clotrimazole-betamethasone (LOTRISONE) cream Apply 1 application  topically 2 (two) times daily as needed (irritation).     COVID-19 mRNA vaccine 2023-2024 (COMIRNATY) syringe Inject into the muscle. 0.3 mL 0   CRANBERRY PO Take 1 tablet by mouth daily. 650 mg daily     Cyanocobalamin 5000 MCG CAPS Take 1 capsule by mouth daily.     dronedarone (MULTAQ) 400 MG tablet Take 1 tablet (400 mg total) by mouth 2 (two) times daily with a meal. 60 tablet 3   ferrous sulfate 325 (65 FE) MG EC tablet  Take 325 mg by mouth 2 (two) times daily.     Fish Oil OIL Take 1,000 mg by mouth daily.      gluconic acid-citric acid (RENACIDIN) irrigation Irrigate with 30 mLs as directed every other day.     Incontinence Supplies (CATHETER EXTENSION TUBING) MISC Inject 1 fluid ounce into the vein as needed. Use as directed     Leuprolide Acetate (LUPRON IJ) Inject as directed as needed (Prostate cancer).      levothyroxine (SYNTHROID, LEVOTHROID) 150 MCG tablet Take 150 mcg by mouth daily before breakfast.     lisinopril (PRINIVIL,ZESTRIL) 20 MG tablet Take 20 mg  by mouth 2 (two) times a day.      Multiple Vitamin (MULTIVITAMIN) tablet Take 1 tablet by mouth daily.     omeprazole (PRILOSEC) 20 MG capsule Take 20 mg by mouth daily.      rosuvastatin (CRESTOR) 10 MG tablet Take 10 mg by mouth daily.     RSV vaccine recomb adjuvanted (AREXVY) 120 MCG/0.5ML injection Inject into the muscle. 0.5 mL 0   vitamin C (ASCORBIC ACID) 500 MG tablet Take 500 mg by mouth daily.     Water For Irrigation, Sterile (STERILE WATER FOR IRRIGATION) Irrigate with 1,000,000 mLs as directed as needed. Use as directed     mirabegron ER (MYRBETRIQ) 50 MG TB24 tablet Take 50 mg by mouth as needed.     No current facility-administered medications for this encounter.    No Known Allergies  Social History   Socioeconomic History   Marital status: Married    Spouse name: Not on file   Number of children: 2   Years of education: Masters   Highest education level: Not on file  Occupational History   Occupation: Retired  Tobacco Use   Smoking status: Former    Types: Cigarettes    Quit date: 02/01/1980    Years since quitting: 42.2   Smokeless tobacco: Never   Tobacco comments:    Former smoker 04/14/22  Vaping Use   Vaping Use: Never used  Substance and Sexual Activity   Alcohol use: Yes    Alcohol/week: 0.0 - 4.0 standard drinks of alcohol    Comment: 1 glass red wine daily 04/14/22   Drug use: No   Sexual activity: Not on file  Other Topics Concern   Not on file  Social History Narrative   Lives in [place] with spouse.   Right-handed.   2 cups caffeine daily.   Retired from Database administrator   Social Determinants of Radio broadcast assistant Strain: Not on file  Food Insecurity: Not on file  Transportation Needs: Not on file  Physical Activity: Not on file  Stress: Not on file  Social Connections: Not on file  Intimate Partner Violence: Not on file    Family History  Problem Relation Age of Onset   Breast cancer Mother     Parkinson's disease Father    Colon cancer Neg Hx    Esophageal cancer Neg Hx    Rectal cancer Neg Hx    Colon polyps Neg Hx    Stomach cancer Neg Hx     ROS- All systems are reviewed and negative except as per the HPI above  Physical Exam: Vitals:   04/14/22 0910  BP: 116/62  Pulse: (!) 57  Weight: 91.6 kg  Height: 5' 10.5" (1.791 m)     Wt Readings from Last 3 Encounters:  04/14/22 91.6 kg  01/03/22 88.5 kg  12/27/21 91.4 kg    Labs: Lab Results  Component Value Date   NA 139 01/20/2020   K 4.9 01/20/2020   CL 100 01/20/2020   CO2 24 01/20/2020   GLUCOSE 104 (H) 01/20/2020   BUN 17 01/20/2020   CREATININE 1.07 01/20/2020   CALCIUM 9.9 01/20/2020   Lab Results  Component Value Date   INR 2.5 (H) 04/03/2008   No results found for: "CHOL", "HDL", "LDLCALC", "TRIG"   GEN- The patient is a well appearing elderly male, alert and oriented x 3 today.   HEENT-head normocephalic, atraumatic, sclera clear, conjunctiva pink, hearing intact, trachea midline. Lungs- Clear to ausculation bilaterally, normal work of breathing Heart- Regular rate and rhythm, no murmurs, rubs or gallops  GI- soft, NT, ND, + BS Extremities- no clubbing, cyanosis, or edema MS- no significant deformity or atrophy Skin- no rash or lesion Psych- euthymic mood, full affect Neuro- strength and sensation are intact   EKG today demonstrates SB, 1st degree AV block Vent. rate 57 BPM PR interval 240 ms QRS duration 88 ms QT/QTcB 436/424 ms   CHA2DS2-VASc Score = 4  The patient's score is based upon: CHF History: 0 HTN History: 1 Diabetes History: 0 Stroke History: 0 Vascular Disease History: 1 Age Score: 2 Gender Score: 0       ASSESSMENT AND PLAN: 1. Paroxysmal Atrial Fibrillation (ICD10:  I48.0) The patient's CHA2DS2-VASc score is 4, indicating a 4.8% annual risk of stroke.   S/p repeat afib ablation 02/06/20 with Dr Rayann Heman Patient appears to be maintaining SR. With a CAC score  of 2280 on his CT, we discussed the implications of the CAST trial. Will discontinue flecainide and start Multaq 400 mg BID after a 3 day washout. Labs in care everywhere reviewed. Cmet/CBC today.  Has Kardia mobile for home monitoring.  Continue Eliquis 5 mg BID  2. Secondary Hypercoagulable State (ICD10:  D68.69) The patient is at significant risk for stroke/thromboembolism based upon his CHA2DS2-VASc Score of 4.  Continue Apixaban (Eliquis).   3. HTN Stable, no changes today.  4. CAD CAC score 2280 On statin No anginal symptoms   Follow up in the AF clinic next week for ECG after starting Multaq. Will also refer to establish care with a new EP.    [facility] [address] [place], [place] [address] [phone]

## 2022-04-14 NOTE — Patient Instructions (Signed)
STOP flecainide   START on Sunday march 17th in the evening -- Multaq '400mg'$  twice a day WITH FOOD

## 2022-04-22 ENCOUNTER — Ambulatory Visit (HOSPITAL_COMMUNITY)
Admission: RE | Admit: 2022-04-22 | Discharge: 2022-04-22 | Disposition: A | Discharge status: Home / Self Care | Payer: Medicare Other | Source: Ambulatory Visit | Attending: Physician Assistant | Admitting: Physician Assistant

## 2022-04-22 VITALS — HR 45

## 2022-04-22 DIAGNOSIS — I480 Paroxysmal atrial fibrillation: Secondary | ICD-10-CM | POA: Diagnosis not present

## 2022-04-22 NOTE — Progress Notes (Signed)
Patient returns for ECG after starting Multaq. ECG shows:  SB, 1st degree AV block Vent. rate 45 BPM PR interval 242 ms QRS duration 86 ms QT/QTcB 464/401 ms  Patient is tolerating the medication without issue. His heart rates are typically in the mid 50's bpm. He denies dizziness, fatigue, or presyncope. He will need a CMET at his next visit. Follow up with Dr Harrell Gave as scheduled and with new EP per recall. AF clinic in 8 months.

## 2022-05-10 ENCOUNTER — Other Ambulatory Visit (HOSPITAL_COMMUNITY): Payer: Self-pay | Admitting: *Deleted

## 2022-05-10 MED ORDER — MULTAQ 400 MG PO TABS: 400.0000 mg | ORAL_TABLET | Freq: Two times a day (BID) | ORAL | 6 refills | Status: DC

## 2022-05-11 DIAGNOSIS — Z466 Encounter for fitting and adjustment of urinary device: Secondary | ICD-10-CM | POA: Diagnosis not present

## 2022-05-11 DIAGNOSIS — Z9359 Other cystostomy status: Secondary | ICD-10-CM | POA: Diagnosis not present

## 2022-05-11 DIAGNOSIS — R339 Retention of urine, unspecified: Secondary | ICD-10-CM | POA: Diagnosis not present

## 2022-05-23 ENCOUNTER — Other Ambulatory Visit (HOSPITAL_COMMUNITY): Payer: Self-pay | Admitting: *Deleted

## 2022-05-23 MED ORDER — MULTAQ 400 MG PO TABS: 400.0000 mg | ORAL_TABLET | Freq: Two times a day (BID) | ORAL | 6 refills | Status: DC

## 2022-06-08 DIAGNOSIS — Z9359 Other cystostomy status: Secondary | ICD-10-CM | POA: Diagnosis not present

## 2022-06-08 DIAGNOSIS — Z466 Encounter for fitting and adjustment of urinary device: Secondary | ICD-10-CM | POA: Diagnosis not present

## 2022-06-13 DIAGNOSIS — E785 Hyperlipidemia, unspecified: Secondary | ICD-10-CM | POA: Diagnosis not present

## 2022-06-13 DIAGNOSIS — C61 Malignant neoplasm of prostate: Secondary | ICD-10-CM | POA: Diagnosis not present

## 2022-06-13 DIAGNOSIS — Z9359 Other cystostomy status: Secondary | ICD-10-CM | POA: Diagnosis not present

## 2022-06-13 DIAGNOSIS — M549 Dorsalgia, unspecified: Secondary | ICD-10-CM | POA: Diagnosis not present

## 2022-06-13 DIAGNOSIS — R7301 Impaired fasting glucose: Secondary | ICD-10-CM | POA: Diagnosis not present

## 2022-06-13 DIAGNOSIS — N1831 Chronic kidney disease, stage 3a: Secondary | ICD-10-CM | POA: Diagnosis not present

## 2022-06-13 DIAGNOSIS — H9190 Unspecified hearing loss, unspecified ear: Secondary | ICD-10-CM | POA: Diagnosis not present

## 2022-06-13 DIAGNOSIS — E039 Hypothyroidism, unspecified: Secondary | ICD-10-CM | POA: Diagnosis not present

## 2022-06-13 DIAGNOSIS — I2510 Atherosclerotic heart disease of native coronary artery without angina pectoris: Secondary | ICD-10-CM | POA: Diagnosis not present

## 2022-06-13 DIAGNOSIS — G7000 Myasthenia gravis without (acute) exacerbation: Secondary | ICD-10-CM | POA: Diagnosis not present

## 2022-06-13 DIAGNOSIS — I480 Paroxysmal atrial fibrillation: Secondary | ICD-10-CM | POA: Diagnosis not present

## 2022-06-13 DIAGNOSIS — I129 Hypertensive chronic kidney disease with stage 1 through stage 4 chronic kidney disease, or unspecified chronic kidney disease: Secondary | ICD-10-CM | POA: Diagnosis not present

## 2022-06-24 DIAGNOSIS — H903 Sensorineural hearing loss, bilateral: Secondary | ICD-10-CM | POA: Diagnosis not present

## 2022-06-24 NOTE — Progress Notes (Signed)
Cardiology Office Note:    Date:  06/28/2022   ID:  ORION KLOPF, DOB 1946/01/25, MRN [id number]  PCP:  Chilton Greathouse, MD  Cardiologist:  Jodelle Red, MD   Referring MD: Chilton Greathouse, MD   CC: Follow up  History of Present Illness:    Eugene Castaneda is a 77 y.o. male with a hx of paroxysmal atrial fibrillation, hypertension, CAD without angina, sick sinus syndrome, hypothyroidism, hyperlipidemia who is seen for follow up. I initially saw him 11/27/2017 as a new patient to [facility] (previously followed by Dr. Donnie Aho) at the request of [name], [name], MD for the evaluation and management of paroxysmal afib.   Cardiac history: paroxysmal afib, on flecainide "for many years," reports prior treadmill tests that I do not have access to. Has declined anticoagulation in the past but was amenable to starting 01/2019. Uses [employer] intermittently to evaluate for afib.  On 04/14/2022 he followed up with Alphonzo Severance, PA and reported very brief palpitations without indication of Afib on his Kardia device. Flecainide was discontinued and he was switched to Multaq 400 mg BID.   Today, he reports only one episode of Afib detected on 05/05/22 that lasted for several minutes. He has also had some episodes of bradycardia to 43-49 bpm mostly occurring in the mornings. When he is active during the day his heart rate is usually in the 50s. Generally he feels that he has been tolerating the Multaq.  He has gained some weight, both by our scale in the office and his home scale. He endorses a 20-25 lb increase since his Covid infection two years ago. No recent concerns for swelling or fluid retention. Usually he doesn't have much of an appetite. He also remains active with exercising 6 days per week: 3 days swimming (1 hour of constant swimming) and 3 days at the [employer] He may feel fatigued afterwards but otherwise denies exertional symptoms. Rarely he may experience lower RLE  pruritus and leg cramps while swimming.  He saw his PCP 3 weeks ago with labs drawn, reportedly normal.  He denies any palpitations, chest pain, shortness of breath, or peripheral edema. No lightheadedness, headaches, syncope, orthopnea, or PND.   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   has supra-pubic foley catheter put in 2016/sphincter removed in 2017 replaced with the catheter   ATRIAL FIBRILLATION ABLATION N/A 07/23/2019   Procedure: ATRIAL FIBRILLATION ABLATION;  Surgeon: Hillis Range, MD;  Location: [facility] INVASIVE CV LAB;  Service: Cardiovascular;  Laterality: N/A;   ATRIAL FIBRILLATION ABLATION N/A 02/06/2020   Procedure: ATRIAL FIBRILLATION ABLATION;  Surgeon: Hillis Range, MD;  Location: [facility] INVASIVE CV LAB;  Service: Cardiovascular;  Laterality: N/A;   catheter ablationfor afib  2010   COLONOSCOPY  2022   PROSTATECTOMY  2009   TONSILLECTOMY      Current Medications: Current Outpatient Medications on File Prior to Visit  Medication Sig   apixaban (ELIQUIS) 5 MG TABS tablet Take 1 tablet by mouth  twice daily   azaTHIOprine (IMURAN) 50 MG tablet Take 1 tablet (50 mg total) by mouth 2 (two) times daily.   cholecalciferol (VITAMIN D3) 25 MCG (1000 UNIT) tablet Take 1,000 Units by mouth daily. 1 Tablet Daily   clotrimazole-betamethasone (LOTRISONE) cream Apply 1 application  topically 2 (two) times daily as needed (irritation).   COVID-19 mRNA vaccine 2023-2024 (COMIRNATY) syringe Inject into the muscle.   CRANBERRY PO Take 1 tablet by mouth daily. 650 mg daily   Cyanocobalamin 5000 MCG CAPS Take 1 capsule by mouth daily.   dronedarone (MULTAQ) 400 MG tablet Take 1 tablet (400 mg total) by mouth 2 (two) times daily with a meal.   ferrous sulfate 325 (65 FE) MG EC tablet Take 325 mg by mouth 2 (two) times daily.   Fish Oil OIL Take 1,000 mg by mouth daily.    gluconic acid-citric acid (RENACIDIN) irrigation Irrigate with 30 mLs as directed every other day.   Incontinence Supplies (CATHETER EXTENSION TUBING) MISC Inject 1 fluid ounce into the vein as needed. Use as directed   Leuprolide Acetate (LUPRON IJ) Inject as directed as needed (Prostate cancer).    levothyroxine (SYNTHROID, LEVOTHROID) 150 MCG tablet Take 150 mcg by mouth daily before breakfast.   lisinopril (PRINIVIL,ZESTRIL) 20 MG tablet Take 20 mg by mouth 2 (two) times a day.    Multiple Vitamin (MULTIVITAMIN) tablet Take 1 tablet by mouth daily.   omeprazole (PRILOSEC) 20 MG capsule Take 20 mg by mouth daily.    rosuvastatin (CRESTOR) 10 MG tablet Take 10 mg by mouth daily.   RSV vaccine recomb adjuvanted (AREXVY) 120 MCG/0.5ML injection Inject into the muscle.   vitamin C (ASCORBIC ACID) 500 MG tablet Take 500 mg by mouth daily.   Water For Irrigation, Sterile (STERILE WATER FOR IRRIGATION) Irrigate with 1,000,000 mLs as directed as needed. Use as directed   mirabegron ER (MYRBETRIQ) 50 MG TB24 tablet Take 50 mg by mouth as needed.   No current facility-administered medications on file prior to visit.     Allergies:    Patient has no known allergies.   Social History   Tobacco Use   Smoking status: Former    Types: Cigarettes    Quit date: 02/01/1980    Years since quitting: 42.4   Smokeless tobacco: Never   Tobacco comments:    Former smoker 04/14/22  Vaping Use   Vaping Use: Never used  Substance Use Topics   Alcohol use: Yes    Alcohol/week: 0.0 - 4.0 standard drinks of alcohol    Comment: 1 glass red wine daily 04/14/22   Drug use: No    Family History: The patient's family history includes Breast cancer in his mother; Parkinson's disease in his father. There is no history of Colon cancer, Esophageal cancer, Rectal cancer, Colon polyps, or Stomach cancer.  ROS:   Please see the history of present illness.   Additional pertinent ROS otherwise unremarkable.   EKGs/Labs/Other Studies Reviewed:    The  following studies were reviewed today:  Afib Ablation 02/06/2020: 1. Sinus rhythm upon presentation.   2. Intracardiac echo reveals a moderate sized left atrium with four separate pulmonary veins without evidence of pulmonary vein stenosis. 3. All four pulmonary veins were quiescent from the prior ablations.  There was a minor amount of electrical activity along the posterior bottom portion of the right inferior pulmonary vein.   4. Right inferior pulmonary vein isolation achieved with ablation.  5. Additional left atrial ablation was performed with a standard box lesion created along the posterior wall of the left atrium 6.   No arrhythmias were induced during isuprel infusion.  No PACs, PVCs, atrial tachycardia, atrial flutter, or other arrhythmias were observed today. 7. CTI block confirmed from a prior ablation 8. No early apparent complications.  ETT 12/17/2019:  Blood pressure demonstrated a normal response to exercise. There was no ST segment deviation noted during stress.   ETT with fair exercise tolerance (6:43); no chest pain; normal blood pressure response; no diagnostic ST  changes; negative adequate exercise tolerance test.  Afib Ablation 07/23/2019: 1. Sinus rhythm upon presentation.   2. Intracardiac echo reveals a normal sized left atrium with four separate pulmonary veins without evidence of pulmonary vein stenosis. 3.  The left pulmonary veins were quiescent from the prior ablation and did not require additional ablation today.  There was minor return of electrical activity along the anterior carina between the right superior and inferior pulmonary veins which was successfully ablated today. 4. Ectopic atrial tachycardia successfully ablated along the anteroseptal right atrium 5. Isthmus dependant atrial flutter successfully ablated along the CTI with isthmus block achieved 6. Atrial fibrillation also observed during isuprel with multiple atypical atrial flutter circuits observed, not suitable for mapping or ablation today. 7. Successful cardioversion to sinus rhythm 8. No early apparent complications.  Echo 07/03/2019: 1. Left ventricular ejection fraction, by estimation, is 60 to 65%. The  left ventricle has normal function. The left ventricle has no regional  wall motion abnormalities. Left ventricular diastolic parameters are  consistent with Grade I diastolic  dysfunction (impaired relaxation). The average left ventricular global  longitudinal strain is -19.6 %. The global longitudinal strain is normal.   2. Right ventricular systolic function is normal. The right ventricular  size is mildly enlarged. There is normal pulmonary artery systolic  pressure. The estimated right ventricular systolic pressure is 22.9 mmHg.   3. The mitral valve is normal in structure. Trivial mitral valve  regurgitation. No evidence of mitral stenosis.   4. The aortic valve is tricuspid. Aortic valve regurgitation is trivial.  Mild aortic valve sclerosis is present, with no evidence of aortic valve  stenosis. Aortic regurgitation PHT measures 1615 msec.   5. Aortic  dilatation noted. There is mild dilatation of the ascending  aorta measuring 38 mm.   6. The inferior vena cava is normal in size with greater than 50%  respiratory variability, suggesting right atrial pressure of 3 mmHg.   Per Dr. York Spaniel notes: treadmill myoview, 04/18/2013.  He did 9 minutes, up to stage II Bruce, for a workload total of 10 METs. Heart rate went from 62 bpm to 144 bpm. No significant ST changes or arrhythmia noted. Imaging suggests a low risk study, with inferolateral ischemia vs more likely artifact, EF 78%.  Echo 10/12/2015 Mild cLVH with normal WM. EF 55% Moderate LAE  Mild RAE Trace AR Mild MR Trace TR and PR IVC dilated, <50% RC  RF ablation 03/2008 Treadmill 05/2003,  03/2008, 03/2013 Echo 03/2003  EKG:  EKG is personally reviewed. 06/28/2022:  sinus bradycardia with 1st degree block at 56 bpm 12/27/2021:  sinus bradycardia with 1st degree AV block at 54 bpm, iRBBB 06/25/2021: sinus bradycardia with 1st degree AV block at 57 bpm, iRBBB 06/17/2020: Sinus rhythm, bradycardic, rate 58 bpm, iRBBB 10/23/19: sinus rhythm, 1st degree AV block, incomplete RBBB at rate of 63 bpm.  Recent Labs: 04/14/2022: ALT 27; BUN 13; Creatinine, Ser 1.29; Hemoglobin 14.3; Platelets 224; Potassium 4.5; Sodium 139   Recent Lipid Panel No results found for: "CHOL", "TRIG", "HDL", "CHOLHDL", "VLDL", "LDLCALC", "LDLDIRECT"   Physical Exam:    VS:  BP 120/70   Pulse (!) 56   Ht 5' 10.5" (1.791 m)   Wt 218 lb (98.9 kg)   BMI 30.84 kg/m     Wt Readings from Last 3 Encounters:  06/28/22 218 lb (98.9 kg)  04/14/22 202 lb (91.6 kg)  01/03/22 195 lb (88.5 kg)    GEN: Well nourished, well developed in no acute distress HEENT: Normal, moist mucous membranes NECK: No JVD CARDIAC: regular rhythm, normal S1 and S2, no rubs or gallops. No murmur. VASCULAR: Radial and DP pulses 2+ bilaterally. No carotid bruits RESPIRATORY:  Clear to auscultation without rales, wheezing or rhonchi   ABDOMEN: Soft, non-tender, non-distended MUSCULOSKELETAL:  Ambulates independently SKIN: Warm and dry, no edema NEUROLOGIC:  Alert and oriented x 3. No focal neuro deficits noted. PSYCHIATRIC:  Normal affect    ASSESSMENT:    1. Paroxysmal atrial fibrillation (HCC)   2. Hypercoagulable state due to paroxysmal atrial fibrillation (HCC)   3. Secondary hypercoagulable state (HCC)   4. Long term current use of anticoagulant   5. Essential hypertension   6. Nonocclusive coronary atherosclerosis of native coronary artery   7. Mixed hyperlipidemia     PLAN:    Paroxysmal atrial fibrillation -s/p ablation but had recurrent afib. Previously on flecainide, now on multaq -labs in 6 mos for multaq monitor -one recent episode -CHA2DS2/VAS Stroke Risk Points = 5   -continue apixaban, denies any bleeding issues    Mixed hyperlipidemia Discussed in the context of coronary calcium. Last LDL 68, goal <70.  -continue rosuvastatin   CAD (coronary artery disease), native coronary artery Swims for >1 hour three times/week without limitations. No angina. On statin, LDL at goal. No aspirin.   Essential hypertension Well controlled, continue lisinopril.  CV risk and prevention counseling: -recommend heart healthy/Mediterranean diet, with whole grains, fruits, vegetable, fish, lean meats, nuts, and olive oil. Limit salt. -recommend moderate walking, 3-5 times/week for 30-50 minutes each session. Aim for at least 150 minutes.week. Goal should be pace of 3 miles/hours, or walking 1.5 miles in 30 minutes -recommend avoidance of tobacco products. Avoid excess alcohol.  Plan for follow up: 6 months or sooner with me as needed.  Jodelle Red, MD, PhD [place]  [facility]   Medication Adjustments/Labs and Tests Ordered: Current medicines are reviewed at length with the patient today.  Concerns regarding medicines are outlined above.   Orders Placed This Encounter  Procedures    EKG 12-Lead   No orders of the defined types were placed in this encounter.  Patient Instructions  Medication Instructions:  Your physician recommends that you continue on your current medications as directed. Please refer to the Current Medication list given to you today.  *If you need a refill on your cardiac medications before your next appointment, please call your pharmacy*  Lab Work: NONE  Testing/Procedures: NONE  Follow-Up: At [facility], you and your health needs are our priority.  As part of our continuing mission to provide you with exceptional heart care, we have created designated Provider Care Teams.  These Care Teams include your primary Cardiologist (physician) and Advanced Practice Providers (APPs -  Physician Assistants and Nurse Practitioners) who all work together to provide you with the care you need, when you need it.  We recommend signing up for the patient portal called "MyChart".  Sign up information is provided on this After Visit Summary.  MyChart is used to connect with patients for Virtual Visits (Telemedicine).  Patients are able to view lab/test results, encounter notes, upcoming appointments, etc.  Non-urgent messages can be sent to your provider as well.   To learn more about what you can do with MyChart, go to [email or web address].    Your next appointment:   6 month(s)  The format for your next appointment:   In Person  Provider:   Jodelle Red, MD       [facility] Stumpf,acting as a scribe for Jodelle Red, MD.,have documented all relevant documentation on the behalf of Jodelle Red, MD,as directed by  Jodelle Red, MD while in the presence of Jodelle Red, MD.  I, Jodelle Red, MD, have reviewed all documentation for this visit. The documentation on 06/28/22 for the exam, diagnosis, procedures, and orders are all accurate and complete.  Signed, Jodelle Red, MD  PhD 06/28/2022 9:56 AM    [facility]

## 2022-06-28 ENCOUNTER — Ambulatory Visit (INDEPENDENT_AMBULATORY_CARE_PROVIDER_SITE_OTHER): Payer: Medicare Other | Admitting: Cardiology

## 2022-06-28 ENCOUNTER — Encounter (HOSPITAL_BASED_OUTPATIENT_CLINIC_OR_DEPARTMENT_OTHER): Payer: Self-pay | Admitting: Cardiology

## 2022-06-28 VITALS — BP 120/70 | HR 56 | Ht 70.5 in | Wt 218.0 lb

## 2022-06-28 DIAGNOSIS — Z7901 Long term (current) use of anticoagulants: Secondary | ICD-10-CM | POA: Diagnosis not present

## 2022-06-28 DIAGNOSIS — I2510 Atherosclerotic heart disease of native coronary artery without angina pectoris: Secondary | ICD-10-CM | POA: Diagnosis not present

## 2022-06-28 DIAGNOSIS — E782 Mixed hyperlipidemia: Secondary | ICD-10-CM | POA: Diagnosis not present

## 2022-06-28 DIAGNOSIS — D6869 Other thrombophilia: Secondary | ICD-10-CM

## 2022-06-28 DIAGNOSIS — I10 Essential (primary) hypertension: Secondary | ICD-10-CM | POA: Diagnosis not present

## 2022-06-28 DIAGNOSIS — I480 Paroxysmal atrial fibrillation: Secondary | ICD-10-CM

## 2022-06-28 NOTE — Patient Instructions (Signed)
Medication Instructions:  Your physician recommends that you continue on your current medications as directed. Please refer to the Current Medication list given to you today.   *If you need a refill on your cardiac medications before your next appointment, please call your pharmacy*  Lab Work: NONE  Testing/Procedures: NONE   Follow-Up: At [facility], you and your health needs are our priority.  As part of our continuing mission to provide you with exceptional heart care, we have created designated Provider Care Teams.  These Care Teams include your primary Cardiologist (physician) and Advanced Practice Providers (APPs -  Physician Assistants and Nurse Practitioners) who all work together to provide you with the care you need, when you need it.  We recommend signing up for the patient portal called "MyChart".  Sign up information is provided on this After Visit Summary.  MyChart is used to connect with patients for Virtual Visits (Telemedicine).  Patients are able to view lab/test results, encounter notes, upcoming appointments, etc.  Non-urgent messages can be sent to your provider as well.   To learn more about what you can do with MyChart, go to [email or web address].    Your next appointment:   6 month(s)  The format for your next appointment:   In Person  Provider:   Bridgette Christopher, MD             

## 2022-07-01 DIAGNOSIS — H25813 Combined forms of age-related cataract, bilateral: Secondary | ICD-10-CM | POA: Diagnosis not present

## 2022-07-01 DIAGNOSIS — H43813 Vitreous degeneration, bilateral: Secondary | ICD-10-CM | POA: Diagnosis not present

## 2022-07-01 DIAGNOSIS — H5203 Hypermetropia, bilateral: Secondary | ICD-10-CM | POA: Diagnosis not present

## 2022-07-01 DIAGNOSIS — H52203 Unspecified astigmatism, bilateral: Secondary | ICD-10-CM | POA: Diagnosis not present

## 2022-07-12 DIAGNOSIS — C61 Malignant neoplasm of prostate: Secondary | ICD-10-CM | POA: Diagnosis not present

## 2022-07-14 DIAGNOSIS — N529 Male erectile dysfunction, unspecified: Secondary | ICD-10-CM | POA: Diagnosis not present

## 2022-07-14 DIAGNOSIS — Z466 Encounter for fitting and adjustment of urinary device: Secondary | ICD-10-CM | POA: Diagnosis not present

## 2022-07-14 DIAGNOSIS — C61 Malignant neoplasm of prostate: Secondary | ICD-10-CM | POA: Diagnosis not present

## 2022-07-14 DIAGNOSIS — N35013 Post-traumatic anterior urethral stricture: Secondary | ICD-10-CM | POA: Diagnosis not present

## 2022-07-14 DIAGNOSIS — Z9359 Other cystostomy status: Secondary | ICD-10-CM | POA: Diagnosis not present

## 2022-07-14 DIAGNOSIS — R339 Retention of urine, unspecified: Secondary | ICD-10-CM | POA: Diagnosis not present

## 2022-07-18 DIAGNOSIS — I129 Hypertensive chronic kidney disease with stage 1 through stage 4 chronic kidney disease, or unspecified chronic kidney disease: Secondary | ICD-10-CM | POA: Diagnosis not present

## 2022-07-18 DIAGNOSIS — N1831 Chronic kidney disease, stage 3a: Secondary | ICD-10-CM | POA: Diagnosis not present

## 2022-08-10 DIAGNOSIS — Z9359 Other cystostomy status: Secondary | ICD-10-CM | POA: Diagnosis not present

## 2022-08-10 DIAGNOSIS — R339 Retention of urine, unspecified: Secondary | ICD-10-CM | POA: Diagnosis not present

## 2022-08-10 DIAGNOSIS — C61 Malignant neoplasm of prostate: Secondary | ICD-10-CM | POA: Diagnosis not present

## 2022-09-20 ENCOUNTER — Other Ambulatory Visit: Payer: Self-pay | Admitting: Cardiology

## 2022-09-20 DIAGNOSIS — I480 Paroxysmal atrial fibrillation: Secondary | ICD-10-CM

## 2022-09-21 NOTE — Telephone Encounter (Signed)
Please review for refill. Thank you! 

## 2022-09-21 NOTE — Telephone Encounter (Signed)
Eliquis 5mg  refill request received. Patient is 77 years old, weight-98.9kg, Crea-1.29 on 04/14/22, Diagnosis-Afib, and last seen by Dr. Cristal Deer on 06/28/22. Dose is appropriate based on dosing criteria. Will send in refill to requested pharmacy.

## 2022-09-27 DIAGNOSIS — L814 Other melanin hyperpigmentation: Secondary | ICD-10-CM | POA: Diagnosis not present

## 2022-09-27 DIAGNOSIS — Z08 Encounter for follow-up examination after completed treatment for malignant neoplasm: Secondary | ICD-10-CM | POA: Diagnosis not present

## 2022-09-27 DIAGNOSIS — L538 Other specified erythematous conditions: Secondary | ICD-10-CM | POA: Diagnosis not present

## 2022-09-27 DIAGNOSIS — L570 Actinic keratosis: Secondary | ICD-10-CM | POA: Diagnosis not present

## 2022-09-27 DIAGNOSIS — D492 Neoplasm of unspecified behavior of bone, soft tissue, and skin: Secondary | ICD-10-CM | POA: Diagnosis not present

## 2022-09-27 DIAGNOSIS — Z85828 Personal history of other malignant neoplasm of skin: Secondary | ICD-10-CM | POA: Diagnosis not present

## 2022-09-27 DIAGNOSIS — L821 Other seborrheic keratosis: Secondary | ICD-10-CM | POA: Diagnosis not present

## 2022-09-27 DIAGNOSIS — D225 Melanocytic nevi of trunk: Secondary | ICD-10-CM | POA: Diagnosis not present

## 2022-10-05 DIAGNOSIS — R339 Retention of urine, unspecified: Secondary | ICD-10-CM | POA: Diagnosis not present

## 2022-10-12 DIAGNOSIS — C61 Malignant neoplasm of prostate: Secondary | ICD-10-CM | POA: Diagnosis not present

## 2022-10-17 NOTE — Progress Notes (Unsigned)
Electrophysiology Office Note:    Date:  10/18/2022   ID:  Eugene Castaneda, DOB 09-26-1945, MRN [id number]  PCP:  Chilton Greathouse, MD   [facility] Providers Cardiologist:  Jodelle Red, MD     Referring MD: Chilton Greathouse, MD   History of Present Illness:    Eugene Castaneda is a 77 y.o. male with a medical history significant for paroxysmal atrial fibrillation status post ablation by Dr. Sampson Goon once in [place], CKD, hypothyroidism, referred for electrophysiology follow-up.     He had an A-fib ablation by Dr. Sharmon Leyden several years ago.  He had  repeat ablations by Dr. Johney Frame in June 2021 and January 2022.  At that most recent ablation all 4 pulmonary veins were quiet.  A small amount of touchup was done on the right inferior vein, and the posterior wall was isolated.  Block was confirmed across a prior CTI line.  He has been maintained on flecainide until fairly recently.  CT coronary showed evidence of coronary disease, so flecainide was discontinued and Multaq started.  He uses a Psychologist, educational     He he showed me several cardiomyopathy since today.  I think these are sinus rhythm with frequent PACs.  He does have occasional palpitations with these episodes but reports they are qualitatively different than his atrial fibrillation  EKGs/Labs/Other Studies Reviewed Today:    Echocardiogram:  TTE June 2021 EF 60 to 65%.  Atria are normal in size.   Monitors:   Stress testing:   Advanced imaging:   Cardiac catherization   EKG:   EKG Interpretation Date/Time:  Tuesday October 18 2022 12:02:20 EDT Ventricular Rate:  53 PR Interval:    QRS Duration:  90 QT Interval:  440 QTC Calculation: 412 R Axis:   -22  Text Interpretation: Sinus rhythm with PVCs and junctional escape beats When compared with ECG of 22-Apr-2022 08:28, PVCs now present Confirmed by York Pellant [phone]) on 10/18/2022 12:19:25 PM     Physical  Exam:    VS:  BP 124/62   Pulse (!) 53   Ht 5' 10.5" (1.791 m)   Wt 221 lb 3.2 oz (100.3 kg)   SpO2 96%   BMI 31.29 kg/m     Wt Readings from Last 3 Encounters:  10/18/22 221 lb 3.2 oz (100.3 kg)  06/28/22 218 lb (98.9 kg)  04/14/22 202 lb (91.6 kg)     GEN:  Well nourished, well developed in no acute distress CARDIAC: RRR, no murmurs, rubs, gallops RESPIRATORY:  Normal work of breathing MUSCULOSKELETAL: no edema    ASSESSMENT & PLAN:    Paroxysmal atrial fibrillation Status post 3 A-fib ablations.  The pulmonary veins and CTI are confirmed to be electrically silent.  A posterior wall lesion set was done on the last ablation Flecainide was discontinued due to evidence of coronary disease He is currently on Multaq; kardiomobile has detected AF, but these appear to be sinus rhythm with PACs Can consider escalating to amidoarone if he has recurrence of symptomatic AF or rapid rates  Secondary hypercoagulable state CHA2DS2-VASc is 4 Continue Eliquis     Signed, Maurice Small, MD  10/18/2022 12:21 PM    [facility]

## 2022-10-18 ENCOUNTER — Encounter: Payer: Self-pay | Admitting: Cardiovascular Disease

## 2022-10-18 ENCOUNTER — Ambulatory Visit: Payer: Medicare Other | Attending: Cardiovascular Disease | Admitting: Cardiovascular Disease

## 2022-10-18 VITALS — BP 124/62 | HR 53 | Ht 70.5 in | Wt 221.2 lb

## 2022-10-18 DIAGNOSIS — I480 Paroxysmal atrial fibrillation: Secondary | ICD-10-CM | POA: Diagnosis present

## 2022-10-18 NOTE — Patient Instructions (Signed)

## 2022-10-19 DIAGNOSIS — R8290 Unspecified abnormal findings in urine: Secondary | ICD-10-CM | POA: Diagnosis not present

## 2022-10-19 DIAGNOSIS — C61 Malignant neoplasm of prostate: Secondary | ICD-10-CM | POA: Diagnosis not present

## 2022-10-24 NOTE — Progress Notes (Addendum)
Chief Complaint  Patient presents with   Myasthenia Gravis    Rm17, alone, Mg:pt stated that he was doing well w/no problems    ASSESSMENT AND PLAN  Eugene Castaneda is a 77 y.o. male   1.  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 Has suprapubic catheter, current UTI. -Call for any worsening symptoms, weakness, follow-up in 1 year or sooner with Dr. Terrace Arabia  11/24/22 SS: Labs from PCP 11/14/22 WBC 6.5, Hgb 15.4, platelet 222, glucose 128, creatinine 1.38, potassium 6.2, calcium 10.4, AST 21, ALT 23, TSH 25.900, A1c 7.0,, urine positive for nitrites, many bacteria.  Repeat 11/21/2022 potassium 5.9, creatinine 1.38, TSH 16.200.  Discontinue lisinopril and start amlodipine  Meds ordered this encounter  Medications   azaTHIOprine (IMURAN) 50 MG tablet    Sig: Take 1/2 tablet in the morning, take 1 tablet in the evening    Dispense:  135 tablet    Refill:  3   DIAGNOSTIC DATA (LABS, IMAGING, TESTING) - I reviewed patient records, labs, notes, testing and imaging myself where available.  MEDICAL HISTORY:  Eugene Castaneda is here to follow-up for serum positive generalized myasthenia gravis. 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 Felipa Eth in May 2016 for evaluation of double vision, he also has intermittent left ptosis, at its worst, he also has mild bulbar, neck flexion weakness, mild chewing difficulty, mild proximal upper extremity  weakness. He never had significant swallowing, breathing difficulty, no gait difficulty   CT chest showed no evidence of thymoma May 2016 MRI of the brain in May 2016 showed mild atrophy His symptoms responded very well to Mestinon 60 mg 3 times a day, no significant side effect Prednisone was started since May 2016, up to 40 mg daily, he responded well, was able to tolerate tapering dose of prednisone  Imuran was started in May 21st 2016, 50 mg 2 tablets twice a day   He has urinary incontinence, planning on to have urethral reconstruction surgery by [facility] urologist Dr.Terlecki   He had urethroplasty in 10/2013. 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 Donnie Aho, receiving Cardiac monitoring reported bradycardia episode, 25/m during sleep   UPDATE April 07 2016:  I reviewed primary care note from Dr.[name], he was found to have mild abnormal liver functional tests, highly suspected for recent initiation of atrovastatin, also complicated by Imuran use, wine intake, will have repeat laboratory evaluation on September  twelfth 2017, if still elevated, he will hold atrovastatin for 4 weeks, and recheck, A1c was 5.6,    Repeat laboratory evaluation in January 2018 showed AST 62, ALT 49, LDL 76, cholesterol 139, he is on lower dose of atorvastatin now,   He is now taking Imuran 50 mg 2 tablets twice a day, at extreme gaze he has  mild double vision, no problem reading, he has several episodes he has mild swallowing trouble, he feels like that the steak stuck behind his sternum, he has mild hoarseness,     He was treated with Tamiflu in Jan 2018 as preventive medication.   He swim one hour without difficulty.     UPDATE Sept 10 2018: He was seen by Dr. Virgel Manifold on July 01 2016, lab evaluations showed CMP, glucose 110, creatinine 0.8, mild elevated AST 59, ALT 44, WBC was 3.79, hemoglobin of 14.2, normal thyroid functional tasks, LDL was 70, A1c 5.8.   He is taking Imuran 50 mg twice a day, doing very well, no significant side effect noticed, continue has mild double vision on extreme gaze, able to swim regularly   UPDATE Sept 11 2019: He is overall doing very well, there is no recurrent muscle weakness, when he drives, looking to extreme left or right, he had transient double vision, he denies swallowing difficulty, no limb muscle weakness, able to swim 1 hour without stopping.   Laboratory evaluations in May 2019, A1c was 5.7, normal CBC, creatinine of 0.9, hemoglobin of 14.5, normal TSH, 0.93, elevated PSA 6.98  Update October 22, 2020: He is overall doing very well, no recurrent double vision, droopy eyelid, or limb muscle weakness, taking Imuran 50 mg 2 tablets twice a day, does not take Mestinon, able to swim freestyle 1 hour without difficulty,  Update October 28, 2021 SS: Able to reduce the Imuran 50/100 mg daily. No change, doing well. Denies diplopia, blurry vision. No weakness of arms or legs. Swims 3 days a week for 1 hour freestyle, 3 days at fitness. No health issues. 10/06/21 A1C 5.8, CMP glucose 119, creatinine 1.2, normal ALT AST, WBC 4.65, HGB 14.6,   Update October 25, 2022 SS: Since last year has been able to reduce imuran down to 50 mg twice daily. Denies any diplopia, ptosis, trouble swallowing. Sees Dr. Felipa Eth next month, continues swimming 3 miles a week, fitness center walking.  Suprapubic cath with  leg bag, at least 7 years. Currently has UTI. Labs 04/14/22 normal LFTs, creatinine 1.29.  PHYSICAL EXAM:   Vitals:   10/25/22 0934 10/25/22 0936  BP: (!) 161/68 (!) 165/75  Pulse: (!) 48 (!) 49  Weight: 219 lb 9.6 oz (99.6 kg)   Height: 5' 10.5" (1.791 m)    Not recorded    Body mass index is 31.06 kg/m.  PHYSICAL EXAMNIATION:  Gen: NAD, conversant, well nourised, well groomed        NEUROLOGICAL EXAM:  MENTAL STATUS: Speech/cognition: Awake, alert, oriented to history taking and casual conversation   CRANIAL NERVES: CN II: Visual fields are full to confrontation. Pupils are round equal and briskly reactive to light. CN III, IV, VI: extraocular movement are normal. No ptosis.  No eye open weakness. Mild cheek puff weakness.  CN V: Facial sensation is intact to light touch CN VII: Face is symmetric with normal eye closure  CN VIII: Hearing is normal to causal conversation. CN IX, X: Phonation is normal. CN XI: Head turning and shoulder shrug are intact  MOTOR: Normal muscle  tone, bulk and strength  REFLEXES: Reflexes are 1 and symmetric at the biceps, triceps, knees, and ankles.    SENSORY: Intact to light touch  COORDINATION: Finger-nose-finger and heel-to-shin are normal bilaterally.  GAIT/STANCE: Normal.  Able to stand from seated position without pushoff.  REVIEW OF SYSTEMS:  Full 14 system review of systems performed and notable only for as above  See HPI  ALLERGIES: No Known Allergies  HOME MEDICATIONS: Current Outpatient Medications  Medication Sig Dispense Refill   apixaban (ELIQUIS) 5 MG TABS tablet Take 1 tablet by mouth twice daily 180 tablet 1   cholecalciferol (VITAMIN D3) 25 MCG (1000 UNIT) tablet Take 1,000 Units by mouth daily. 1 Tablet Daily     clotrimazole-betamethasone (LOTRISONE) cream Apply 1 application  topically 2 (two) times daily as needed (irritation).     COVID-19 mRNA vaccine 2023-2024 (COMIRNATY) syringe Inject into the  muscle. 0.3 mL 0   CRANBERRY PO Take 1 tablet by mouth daily. 650 mg daily     Cyanocobalamin 5000 MCG CAPS Take 1 capsule by mouth daily.     dronedarone (MULTAQ) 400 MG tablet Take 1 tablet (400 mg total) by mouth 2 (two) times daily with a meal. 60 tablet 6   ferrous sulfate 325 (65 FE) MG EC tablet Take 325 mg by mouth 2 (two) times daily.     Fish Oil OIL Take 1,000 mg by mouth daily.      gluconic acid-citric acid (RENACIDIN) irrigation Irrigate with 30 mLs as directed every other day.     Incontinence Supplies (CATHETER EXTENSION TUBING) MISC Inject 1 fluid ounce into the vein as needed. Use as directed     Leuprolide Acetate (LUPRON IJ) Inject as directed as needed (Prostate cancer).      levothyroxine (SYNTHROID, LEVOTHROID) 150 MCG tablet Take 150 mcg by mouth daily before breakfast.     lisinopril (PRINIVIL,ZESTRIL) 20 MG tablet Take 20 mg by mouth 2 (two) times a day.      mirabegron ER (MYRBETRIQ) 50 MG TB24 tablet Take 50 mg by mouth as needed.     Multiple Vitamin (MULTIVITAMIN) tablet Take 1 tablet by mouth daily.     omeprazole (PRILOSEC) 20 MG capsule Take 20 mg by mouth daily.      rosuvastatin (CRESTOR) 10 MG tablet Take 10 mg by mouth daily.     RSV vaccine recomb adjuvanted (AREXVY) 120 MCG/0.5ML injection Inject into the muscle. 0.5 mL 0   vitamin C (ASCORBIC ACID) 500 MG tablet Take 500 mg by mouth daily.     Water For Irrigation, Sterile (STERILE WATER FOR IRRIGATION) Irrigate with 1,000,000 mLs as directed as needed. Use as directed     azaTHIOprine (IMURAN) 50 MG tablet Take 1/2 tablet in the morning, take 1 tablet in the evening 135 tablet 3   No current facility-administered medications for this visit.    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 Sampson Goon 2010 [facility] CHADSVASC 3   Prostate cancer [facility]) 2009   Urethral obstruction    Urinary retention    Suprapubic Catheter placed 04/25/15    PAST SURGICAL HISTORY: Past Surgical History:  Procedure Laterality Date   Artificial Urinary Sphincter  09/2014   has supra-pubic foley catheter put in 2016/sphincter removed in 2017 replaced with the catheter   ATRIAL FIBRILLATION ABLATION N/A 07/23/2019   Procedure: ATRIAL FIBRILLATION ABLATION;  Surgeon: Hillis Range, MD;  Location: [facility] INVASIVE CV LAB;  Service: Cardiovascular;  Laterality: N/A;   ATRIAL FIBRILLATION ABLATION N/A 02/06/2020   Procedure: ATRIAL FIBRILLATION ABLATION;  Surgeon: Hillis Range, MD;  Location: [facility] INVASIVE CV LAB;  Service: Cardiovascular;  Laterality: N/A;   catheter ablationfor afib  2010   COLONOSCOPY  2022   PROSTATECTOMY  2009   TONSILLECTOMY      FAMILY HISTORY: Family History  Problem Relation Age of Onset   Breast cancer Mother    Parkinson's disease Father    Colon cancer Neg Hx    Esophageal cancer Neg Hx    Rectal cancer Neg Hx    Colon polyps Neg Hx    Stomach cancer Neg Hx     SOCIAL HISTORY: Social History   Socioeconomic History   Marital status: Married    Spouse name: Not on file   Number of children: 2   Years of education: Masters   Highest education level: Not on file  Occupational History   Occupation: Retired  Tobacco Use   Smoking status: Former    Current packs/day: 0.00    Types: Cigarettes    Quit date:  02/01/1980    Years since quitting: 42.7   Smokeless tobacco: Never   Tobacco comments:    Former smoker 04/14/22  Vaping Use   Vaping status: Never Used  Substance and Sexual Activity   Alcohol use: Yes    Alcohol/week: 0.0 - 4.0 standard drinks of alcohol    Comment: 1 glass red wine daily 04/14/22   Drug use: No   Sexual activity: Not on file  Other Topics Concern   Not on file  Social History Narrative   Lives in [place] with spouse.   Right-handed.   2 cups caffeine daily.   Retired from Associate Professor   Social Determinants of Corporate investment banker Strain: Not on file  Food Insecurity: Not on file  Transportation Needs: Not on file  Physical Activity: Not on file  Stress: Not on file  Social Connections: Not on file  Intimate Partner Violence: Not on file   Margie Ege, Edrick Oh, DNP  [facility] [address], [address] [place], [place] [address] [phone]

## 2022-10-25 ENCOUNTER — Encounter: Payer: Self-pay | Admitting: Neurology

## 2022-10-25 ENCOUNTER — Ambulatory Visit (INDEPENDENT_AMBULATORY_CARE_PROVIDER_SITE_OTHER): Payer: Medicare Other | Admitting: Neurology

## 2022-10-25 VITALS — BP 165/75 | HR 49 | Ht 70.5 in | Wt 219.6 lb

## 2022-10-25 DIAGNOSIS — G7000 Myasthenia gravis without (acute) exacerbation: Secondary | ICD-10-CM

## 2022-10-25 MED ORDER — AZATHIOPRINE 50 MG PO TABS: ORAL_TABLET | ORAL | 3 refills | Status: DC

## 2022-10-25 NOTE — Patient Instructions (Signed)
We will reduce Imuran down to 25 mg AM/50 mg PM. Monitor for any worsening symptoms of myasthenia, let me know.

## 2022-11-02 DIAGNOSIS — Z9359 Other cystostomy status: Secondary | ICD-10-CM | POA: Diagnosis not present

## 2022-11-02 DIAGNOSIS — Z466 Encounter for fitting and adjustment of urinary device: Secondary | ICD-10-CM | POA: Diagnosis not present

## 2022-11-02 DIAGNOSIS — R339 Retention of urine, unspecified: Secondary | ICD-10-CM | POA: Diagnosis not present

## 2022-11-14 DIAGNOSIS — Z125 Encounter for screening for malignant neoplasm of prostate: Secondary | ICD-10-CM | POA: Diagnosis not present

## 2022-11-14 DIAGNOSIS — I10 Essential (primary) hypertension: Secondary | ICD-10-CM | POA: Diagnosis not present

## 2022-11-14 DIAGNOSIS — Z0000 Encounter for general adult medical examination without abnormal findings: Secondary | ICD-10-CM | POA: Diagnosis not present

## 2022-11-14 DIAGNOSIS — E030 Congenital hypothyroidism with diffuse goiter: Secondary | ICD-10-CM | POA: Diagnosis not present

## 2022-11-14 DIAGNOSIS — R7301 Impaired fasting glucose: Secondary | ICD-10-CM | POA: Diagnosis not present

## 2022-11-14 DIAGNOSIS — E785 Hyperlipidemia, unspecified: Secondary | ICD-10-CM | POA: Diagnosis not present

## 2022-11-14 DIAGNOSIS — Z1212 Encounter for screening for malignant neoplasm of rectum: Secondary | ICD-10-CM | POA: Diagnosis not present

## 2022-11-14 DIAGNOSIS — I129 Hypertensive chronic kidney disease with stage 1 through stage 4 chronic kidney disease, or unspecified chronic kidney disease: Secondary | ICD-10-CM | POA: Diagnosis not present

## 2022-11-14 DIAGNOSIS — N183 Chronic kidney disease, stage 3 unspecified: Secondary | ICD-10-CM | POA: Diagnosis not present

## 2022-11-16 DIAGNOSIS — I10 Essential (primary) hypertension: Secondary | ICD-10-CM | POA: Diagnosis not present

## 2022-11-16 DIAGNOSIS — R82998 Other abnormal findings in urine: Secondary | ICD-10-CM | POA: Diagnosis not present

## 2022-11-17 DIAGNOSIS — R339 Retention of urine, unspecified: Secondary | ICD-10-CM | POA: Diagnosis not present

## 2022-11-21 DIAGNOSIS — E1169 Type 2 diabetes mellitus with other specified complication: Secondary | ICD-10-CM | POA: Diagnosis not present

## 2022-11-21 DIAGNOSIS — N1831 Chronic kidney disease, stage 3a: Secondary | ICD-10-CM | POA: Diagnosis not present

## 2022-11-21 DIAGNOSIS — I129 Hypertensive chronic kidney disease with stage 1 through stage 4 chronic kidney disease, or unspecified chronic kidney disease: Secondary | ICD-10-CM | POA: Diagnosis not present

## 2022-11-21 DIAGNOSIS — H9190 Unspecified hearing loss, unspecified ear: Secondary | ICD-10-CM | POA: Diagnosis not present

## 2022-11-21 DIAGNOSIS — Z0000 Encounter for general adult medical examination without abnormal findings: Secondary | ICD-10-CM | POA: Diagnosis not present

## 2022-11-21 DIAGNOSIS — Z1331 Encounter for screening for depression: Secondary | ICD-10-CM | POA: Diagnosis not present

## 2022-11-21 DIAGNOSIS — I2510 Atherosclerotic heart disease of native coronary artery without angina pectoris: Secondary | ICD-10-CM | POA: Diagnosis not present

## 2022-11-21 DIAGNOSIS — I480 Paroxysmal atrial fibrillation: Secondary | ICD-10-CM | POA: Diagnosis not present

## 2022-11-21 DIAGNOSIS — E875 Hyperkalemia: Secondary | ICD-10-CM | POA: Diagnosis not present

## 2022-11-21 DIAGNOSIS — Z9359 Other cystostomy status: Secondary | ICD-10-CM | POA: Diagnosis not present

## 2022-11-21 DIAGNOSIS — C61 Malignant neoplasm of prostate: Secondary | ICD-10-CM | POA: Diagnosis not present

## 2022-11-21 DIAGNOSIS — E785 Hyperlipidemia, unspecified: Secondary | ICD-10-CM | POA: Diagnosis not present

## 2022-11-21 DIAGNOSIS — Z23 Encounter for immunization: Secondary | ICD-10-CM | POA: Diagnosis not present

## 2022-11-21 DIAGNOSIS — Z1339 Encounter for screening examination for other mental health and behavioral disorders: Secondary | ICD-10-CM | POA: Diagnosis not present

## 2022-11-21 DIAGNOSIS — E039 Hypothyroidism, unspecified: Secondary | ICD-10-CM | POA: Diagnosis not present

## 2022-12-07 DIAGNOSIS — Z466 Encounter for fitting and adjustment of urinary device: Secondary | ICD-10-CM | POA: Diagnosis not present

## 2022-12-07 DIAGNOSIS — Z9359 Other cystostomy status: Secondary | ICD-10-CM | POA: Diagnosis not present

## 2022-12-08 ENCOUNTER — Other Ambulatory Visit (HOSPITAL_BASED_OUTPATIENT_CLINIC_OR_DEPARTMENT_OTHER): Payer: Self-pay

## 2022-12-08 DIAGNOSIS — Z23 Encounter for immunization: Secondary | ICD-10-CM | POA: Diagnosis not present

## 2022-12-08 MED ORDER — COMIRNATY 30 MCG/0.3ML IM SUSY
PREFILLED_SYRINGE | INTRAMUSCULAR | 0 refills | Status: DC
  Filled 2022-12-08: qty 0.3, 1d supply, fill #0

## 2022-12-15 DIAGNOSIS — N1831 Chronic kidney disease, stage 3a: Secondary | ICD-10-CM | POA: Diagnosis not present

## 2022-12-15 DIAGNOSIS — E1169 Type 2 diabetes mellitus with other specified complication: Secondary | ICD-10-CM | POA: Diagnosis not present

## 2022-12-15 DIAGNOSIS — I129 Hypertensive chronic kidney disease with stage 1 through stage 4 chronic kidney disease, or unspecified chronic kidney disease: Secondary | ICD-10-CM | POA: Diagnosis not present

## 2022-12-19 ENCOUNTER — Ambulatory Visit (HOSPITAL_COMMUNITY)
Admission: RE | Admit: 2022-12-19 | Discharge: 2022-12-19 | Disposition: A | Discharge status: Home / Self Care | Payer: Medicare Other | Source: Ambulatory Visit | Attending: Physician Assistant | Admitting: Physician Assistant

## 2022-12-19 VITALS — BP 156/56 | HR 43 | Ht 70.5 in | Wt 212.8 lb

## 2022-12-19 DIAGNOSIS — D6869 Other thrombophilia: Secondary | ICD-10-CM | POA: Insufficient documentation

## 2022-12-19 DIAGNOSIS — I129 Hypertensive chronic kidney disease with stage 1 through stage 4 chronic kidney disease, or unspecified chronic kidney disease: Secondary | ICD-10-CM | POA: Insufficient documentation

## 2022-12-19 DIAGNOSIS — N183 Chronic kidney disease, stage 3 unspecified: Secondary | ICD-10-CM | POA: Diagnosis not present

## 2022-12-19 DIAGNOSIS — Z79899 Other long term (current) drug therapy: Secondary | ICD-10-CM | POA: Diagnosis not present

## 2022-12-19 DIAGNOSIS — Z7901 Long term (current) use of anticoagulants: Secondary | ICD-10-CM | POA: Diagnosis not present

## 2022-12-19 DIAGNOSIS — I2510 Atherosclerotic heart disease of native coronary artery without angina pectoris: Secondary | ICD-10-CM | POA: Diagnosis not present

## 2022-12-19 DIAGNOSIS — I480 Paroxysmal atrial fibrillation: Secondary | ICD-10-CM | POA: Diagnosis not present

## 2022-12-19 DIAGNOSIS — Z5181 Encounter for therapeutic drug level monitoring: Secondary | ICD-10-CM | POA: Diagnosis not present

## 2022-12-19 DIAGNOSIS — R001 Bradycardia, unspecified: Secondary | ICD-10-CM | POA: Diagnosis not present

## 2022-12-19 NOTE — Addendum Note (Signed)
Encounter addended by: Learta Codding, CMA on: 12/19/2022 4:59 PM  Actions taken: Order list changed

## 2022-12-19 NOTE — Progress Notes (Signed)
Primary Care Physician: Chilton Greathouse, MD Referring Physician: Dr. Johney Frame  Primary Cardiologist: Dr Cristal Deer  Primary EP: Dr Nelly Laurence   Eugene Castaneda is a 77 y.o. male with a h/o paroxysmal afib, CAD, CKD, HLD, HTN, hypothyroidism who presents for follow up in the [facility]. Initial afib ablation several years ago with Dr. Sampson Goon in [place]. He has been maintained on flecainide without any daily AV nodal agents.   On follow up today, patient reports that he has done well since his last visit. He did have two days of afib in mid August and September (Kardia strips reviewed) but otherwise he has been staying in SR. His Lourena Simmonds will occasionally show SR with PACs as well. No bleeding issues on anticoagulation. He has noticed his BP has been running high at home. His PCP just started amlodipine. Also, his TSH was found to be >20 and his levothyroxine was increased per patient.   T oday, he denies symptoms of chest pain, shortness of breath, orthopnea, PND, lower extremity edema, dizziness, presyncope, syncope, or neurologic sequela. The patient is tolerating medications without difficulties and is otherwise without complaint today.   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 Sampson Goon 2010 [facility] CHADSVASC 3   Prostate cancer  [facility]) 2009   Urethral obstruction    Urinary retention    Suprapubic Catheter placed 04/25/15    Current Outpatient Medications  Medication Sig Dispense Refill   amLODipine (NORVASC) 10 MG tablet 1 tablet Orally Once a day for 30 days     apixaban (ELIQUIS) 5 MG TABS tablet Take 1 tablet by mouth twice daily 180 tablet 1   azaTHIOprine (IMURAN) 50 MG tablet Take 1/2 tablet in the morning, take 1 tablet in the evening 135 tablet 3   cholecalciferol (VITAMIN D3) 25 MCG (1000 UNIT) tablet Take 1,000 Units by mouth daily. 1 Tablet Daily     clotrimazole-betamethasone (LOTRISONE) cream Apply 1 application  topically 2 (two) times daily as needed (irritation).     CRANBERRY PO Take 1 tablet by mouth daily. 650 mg daily     Cyanocobalamin 5000 MCG CAPS Take 1 capsule by mouth daily.     DENTA 5000 PLUS 1.1 % CREA dental cream Take 1 Application by mouth at bedtime.     dronedarone (MULTAQ) 400 MG tablet Take 1 tablet (400 mg total) by mouth 2 (two) times daily with a meal. 60 tablet 6   ferrous sulfate 325 (65 FE) MG EC tablet Take 325 mg by mouth 2 (two) times daily.     Fish Oil OIL Take 1,000 mg by mouth daily.      gluconic acid-citric acid (RENACIDIN) irrigation Irrigate with 30 mLs as directed every other day.  Incontinence Supplies (CATHETER EXTENSION TUBING) MISC Inject 1 fluid ounce into the vein as needed. Use as directed     JARDIANCE 10 MG TABS tablet Take 10 mg by mouth daily.     Leuprolide Acetate (LUPRON IJ) Inject as directed as needed (Prostate cancer).      levothyroxine (SYNTHROID) 175 MCG tablet Take 175 mcg by mouth daily before breakfast.     mirabegron ER (MYRBETRIQ) 50 MG TB24 tablet Take 50 mg by mouth as needed.     Multiple Vitamin (MULTIVITAMIN) tablet Take 1 tablet by mouth daily.     omeprazole (PRILOSEC) 20 MG capsule Take 20 mg by mouth daily.      rosuvastatin (CRESTOR) 10 MG tablet Take 10 mg by mouth daily.     vitamin C (ASCORBIC ACID) 500 MG tablet Take  500 mg by mouth daily.     Water For Irrigation, Sterile (STERILE WATER FOR IRRIGATION) Irrigate with 1,000,000 mLs as directed as needed. Use as directed     lisinopril (PRINIVIL,ZESTRIL) 20 MG tablet Take 20 mg by mouth 2 (two) times a day.  (Patient not taking: Reported on 12/19/2022)     No current facility-administered medications for this encounter.    ROS- All systems are reviewed and negative except as per the HPI above  Physical Exam: Vitals:   12/19/22 0845  BP: (!) 156/56  Pulse: (!) 43  Weight: 96.5 kg  Height: 5' 10.5" (1.791 m)    Wt Readings from Last 3 Encounters:  12/19/22 96.5 kg  10/25/22 99.6 kg  10/18/22 100.3 kg    GEN: Well nourished, well developed in no acute distress NECK: No JVD; No carotid bruits CARDIAC: Regular rate and rhythm, bradycardia, no murmurs, rubs, gallops RESPIRATORY:  Clear to auscultation without rales, wheezing or rhonchi  ABDOMEN: Soft, non-tender, non-distended EXTREMITIES:  No edema; No deformity    EKG today demonstrates SB, 1st degree AV block Vent. rate 43 BPM PR interval 254 ms QRS duration 88 ms QT/QTcB 494/417 ms   CHA2DS2-VASc Score = 4  The patient's score is based upon: CHF History: 0 HTN History: 1 Diabetes History: 0 Stroke History: 0 Vascular Disease History: 1 Age Score: 2 Gender Score: 0       ASSESSMENT AND PLAN: Paroxysmal Atrial Fibrillation (ICD10:  I48.0) The patient's CHA2DS2-VASc score is 4, indicating a 4.8% annual risk of stroke.   S/p repeat afib ablation 02/06/20 with Dr Johney Frame Flecainide discontinued due to CAD Continue Multaq 400 mg BID  Has Kardia mobile for home monitoring.  Continue Eliquis 5 mg BID  Secondary Hypercoagulable State (ICD10:  D68.69) The patient is at significant risk for stroke/thromboembolism based upon his CHA2DS2-VASc Score of 4.  Continue Apixaban (Eliquis).   HTN Elevated today, medication being adjusted by PCP.  CAD CAC score 2280 On statin No anginal  symptoms  Bradycardia HR 43 today patient asymptomatic. He is able to swim 3 days per week without limitation. No excessive fatigue, dizziness, or presyncope. Would have a low threshold to discontinue Multaq if he develops symptoms.    Follow up in the AF clinic in 3 months.    Jorja Loa PA-C [facility] [address] [place], [place] [address] [phone]

## 2022-12-23 ENCOUNTER — Other Ambulatory Visit: Payer: Self-pay | Admitting: Neurology

## 2023-01-04 DIAGNOSIS — R339 Retention of urine, unspecified: Secondary | ICD-10-CM | POA: Diagnosis not present

## 2023-01-04 DIAGNOSIS — C61 Malignant neoplasm of prostate: Secondary | ICD-10-CM | POA: Diagnosis not present

## 2023-01-11 ENCOUNTER — Ambulatory Visit (HOSPITAL_BASED_OUTPATIENT_CLINIC_OR_DEPARTMENT_OTHER): Payer: Medicare Other | Admitting: Cardiology

## 2023-01-13 ENCOUNTER — Encounter (HOSPITAL_BASED_OUTPATIENT_CLINIC_OR_DEPARTMENT_OTHER): Payer: Self-pay | Admitting: Family

## 2023-01-13 ENCOUNTER — Ambulatory Visit (HOSPITAL_BASED_OUTPATIENT_CLINIC_OR_DEPARTMENT_OTHER): Payer: Medicare Other | Admitting: Family

## 2023-01-13 VITALS — BP 156/80 | HR 50 | Ht 70.0 in | Wt 208.8 lb

## 2023-01-13 DIAGNOSIS — E875 Hyperkalemia: Secondary | ICD-10-CM | POA: Diagnosis not present

## 2023-01-13 DIAGNOSIS — E785 Hyperlipidemia, unspecified: Secondary | ICD-10-CM

## 2023-01-13 DIAGNOSIS — I480 Paroxysmal atrial fibrillation: Secondary | ICD-10-CM | POA: Diagnosis not present

## 2023-01-13 DIAGNOSIS — I25118 Atherosclerotic heart disease of native coronary artery with other forms of angina pectoris: Secondary | ICD-10-CM | POA: Diagnosis not present

## 2023-01-13 DIAGNOSIS — I10 Essential (primary) hypertension: Secondary | ICD-10-CM | POA: Diagnosis not present

## 2023-01-13 DIAGNOSIS — D6859 Other primary thrombophilia: Secondary | ICD-10-CM

## 2023-01-13 NOTE — Patient Instructions (Addendum)
Medication Instructions:   Recommend discussing the following options with your primary care office at your next visit:  Increase Hydrochlorothiazide to 25mg  daily with repeat BMP in a week Add Hydralazine 25mg  twice daily  *If you need a refill on your cardiac medications before your next appointment, please call your pharmacy*  Follow-Up: At [facility], you and your health needs are our priority.  As part of our continuing mission to provide you with exceptional heart care, we have created designated Provider Care Teams.  These Care Teams include your primary Cardiologist (physician) and Advanced Practice Providers (APPs -  Physician Assistants and Nurse Practitioners) who all work together to provide you with the care you need, when you need it.  We recommend signing up for the patient portal called "MyChart".  Sign up information is provided on this After Visit Summary.  MyChart is used to connect with patients for Virtual Visits (Telemedicine).  Patients are able to view lab/test results, encounter notes, upcoming appointments, etc.  Non-urgent messages can be sent to your provider as well.   To learn more about what you can do with MyChart, go to [email or web address].    Your next appointment:   6 month(s)  Provider:   Jodelle Red, MD or Gillian Shields, NP    Other Instructions  Tips to Measure your Blood Pressure Correctly  Here's what you can do to ensure a correct reading:  Don't drink a caffeinated beverage or smoke during the 30 minutes before the test.  Sit quietly for five minutes before the test begins.  During the measurement, sit in a chair with your feet on the floor and your arm supported so your elbow is at about heart level.  The inflatable part of the cuff should completely cover at least 80% of your upper arm, and the cuff should be placed on bare skin, not over a shirt.  Don't talk during the measurement.  Blood pressure categories   Blood pressure category SYSTOLIC (upper number)  DIASTOLIC (lower number)  Normal Less than 120 mm Hg and Less than 80 mm Hg  Elevated 120-129 mm Hg and Less than 80 mm Hg  High blood pressure: Stage 1 hypertension 130-139 mm Hg or 80-89 mm Hg  High blood pressure: Stage 2 hypertension 140 mm Hg or higher or 90 mm Hg or higher  Hypertensive crisis (consult your doctor immediately) Higher than 180 mm Hg and/or Higher than 120 mm Hg  Source: American Heart Association and American Stroke Association. For more on getting your blood pressure under control, buy Controlling Your Blood Pressure, a Special Health Report from [facility].   Blood Pressure Log   Date   Time  Blood Pressure  Example: Nov 1 9 AM 124/78

## 2023-01-13 NOTE — Progress Notes (Signed)
Cardiology Office Note:  .   Date:  01/13/2023  ID:  Eugene Castaneda, DOB 1945/04/03, MRN [id number] PCP: Chilton Greathouse, MD  [facility] Providers Cardiologist:  Jodelle Red, MD Electrophysiologist:  Maurice Small, MD    History of Present Illness: .   Eugene Castaneda is a 77 y.o. male with hx of paroxysmal atrial fibrillation s/p ablation previously on Flecainide now on Multaq, hypertension, nonobstructive coronary artery disease, bradycardia,, hypothyroidism, hyperlipidemia  A visit with A-fib clinic 04/2022 Flecainide was discontinued due to episodes of palpitations and he was transitioned to Multaq.  Last seen by Dr. Cristal Deer 06/2022 doing well from a cardiac perspective and staying active by swimming 1 hour 3 times per week.  Seen by Dr. Nelly Laurence 10/2022 in A-fib clinic 12/2022 doing well.  Presents today for follow-up independently.  Noticeably pressure has been elevated and his PCP a couple months ago stopped his blood lisinopril and started hydrochlorothiazide and amlodipine.  SBP at home ranging 130-160s.  Home BP cuff found to read 6 points high systolic.  His levothyroxine dose was also adjusted approximately 6 weeks ago.  Continues to swim 3 times per week as as well as exercise at [facility] without chest pain, dyspnea, edema.  Endorses following a low-sodium heart healthy diet.  ROS: Please see the history of present illness.    All other systems reviewed and are negative.   Studies Reviewed: .        Cardiac Studies & Procedures     STRESS TESTS  EXERCISE TOLERANCE TEST (ETT) 12/17/2019  Narrative  Blood pressure demonstrated a normal response to exercise.  There was no ST segment deviation noted during stress.  ETT with fair exercise tolerance (6:43); no chest pain; normal blood pressure response; no diagnostic ST changes; negative adequate exercise tolerance test.  ECHOCARDIOGRAM  ECHOCARDIOGRAM COMPLETE  07/03/2019  Narrative ECHOCARDIOGRAM REPORT    Patient Name:   Eugene Castaneda Date of Exam: 07/03/2019 Medical Rec #:  [id number]         Height:       70.5 in Accession #:    [id number]        Weight:       200.2 lb Date of Birth:  [birth year]         BSA:          2.099 m Patient Age:    73 years          BP:           134/108 mmHg Patient Gender: M                 HR:           66 bpm. Exam Location:  [address]  Procedure: 2D Echo, Cardiac Doppler and Color Doppler  Indications:    I48.0 Atrial fibrillation  History:        Patient has prior history of Echocardiogram examinations, most recent 03/04/2003. CAD, CKD; Risk Factors:Hypertension and HLD.  Sonographer:    Clearence Ped RCS Referring Phys: [id number] JAMES ALLRED  IMPRESSIONS   1. Left ventricular ejection fraction, by estimation, is 60 to 65%. The left ventricle has normal function. The left ventricle has no regional wall motion abnormalities. Left ventricular diastolic parameters are consistent with Grade I diastolic dysfunction (impaired relaxation). The average left ventricular global longitudinal strain is -19.6 %. The global longitudinal strain is normal. 2. Right ventricular systolic function is normal. The right ventricular size is mildly enlarged.  There is normal pulmonary artery systolic pressure. The estimated right ventricular systolic pressure is 22.9 mmHg. 3. The mitral valve is normal in structure. Trivial mitral valve regurgitation. No evidence of mitral stenosis. 4. The aortic valve is tricuspid. Aortic valve regurgitation is trivial. Mild aortic valve sclerosis is present, with no evidence of aortic valve stenosis. Aortic regurgitation PHT measures 1615 msec. 5. Aortic dilatation noted. There is mild dilatation of the ascending aorta measuring 38 mm. 6. The inferior vena cava is normal in size with greater than 50% respiratory variability, suggesting right atrial pressure of 3 mmHg.  FINDINGS Left  Ventricle: Left ventricular ejection fraction, by estimation, is 60 to 65%. The left ventricle has normal function. The left ventricle has no regional wall motion abnormalities. The average left ventricular global longitudinal strain is -19.6 %. The global longitudinal strain is normal. The left ventricular internal cavity size was normal in size. There is no left ventricular hypertrophy. Left ventricular diastolic parameters are consistent with Grade I diastolic dysfunction (impaired relaxation). Normal left ventricular filling pressure.  Right Ventricle: The right ventricular size is mildly enlarged. No increase in right ventricular wall thickness. Right ventricular systolic function is normal. There is normal pulmonary artery systolic pressure. The tricuspid regurgitant velocity is 2.23 m/s, and with an assumed right atrial pressure of 3 mmHg, the estimated right ventricular systolic pressure is 22.9 mmHg.  Left Atrium: Left atrial size was normal in size.  Right Atrium: Right atrial size was normal in size.  Pericardium: There is no evidence of pericardial effusion.  Mitral Valve: The mitral valve is normal in structure. Normal mobility of the mitral valve leaflets. Mild mitral annular calcification. Trivial mitral valve regurgitation. No evidence of mitral valve stenosis.  Tricuspid Valve: The tricuspid valve is normal in structure. Tricuspid valve regurgitation is trivial. No evidence of tricuspid stenosis.  Aortic Valve: The aortic valve is tricuspid. Aortic valve regurgitation is trivial. Aortic regurgitation PHT measures 1615 msec. Mild aortic valve sclerosis is present, with no evidence of aortic valve stenosis.  Pulmonic Valve: The pulmonic valve was normal in structure. Pulmonic valve regurgitation is trivial. No evidence of pulmonic stenosis.  Aorta: Aortic dilatation noted. There is mild dilatation of the ascending aorta measuring 38 mm.  Venous: The inferior vena cava is normal  in size with greater than 50% respiratory variability, suggesting right atrial pressure of 3 mmHg.  IAS/Shunts: The interatrial septum appears to be lipomatous. No atrial level shunt detected by color flow Doppler.   LEFT VENTRICLE PLAX 2D LVIDd:         4.53 cm  Diastology LVIDs:         2.89 cm  LV e' lateral:   9.25 cm/s LV PW:         1.25 cm  LV E/e' lateral: 6.9 LV IVS:        1.01 cm  LV e' medial:    7.83 cm/s LVOT diam:     2.10 cm  LV E/e' medial:  8.1 LV SV:         88 LV SV Index:   42       2D Longitudinal Strain LVOT Area:     3.46 cm 2D Strain GLS (A2C):   -19.9 % 2D Strain GLS (A3C):   -21.0 % 2D Strain GLS (A4C):   -17.9 % 2D Strain GLS Avg:     -19.6 %  RIGHT VENTRICLE RV Basal diam:  4.33 cm RV S prime:     12.50  cm/s TAPSE (M-mode): 2.2 cm RVSP:           22.9 mmHg  LEFT ATRIUM             Index       RIGHT ATRIUM           Index LA diam:        4.20 cm 2.00 cm/m  RA Pressure: 3.00 mmHg LA Vol (A2C):   58.1 ml 27.68 ml/m RA Area:     20.50 cm LA Vol (A4C):   62.3 ml 29.68 ml/m RA Volume:   61.80 ml  29.44 ml/m LA Biplane Vol: 60.8 ml 28.96 ml/m AORTIC VALVE LVOT Vmax:   99.00 cm/s LVOT Vmean:  70.200 cm/s LVOT VTI:    0.254 m AI PHT:      1615 msec  AORTA Ao Root diam: 3.60 cm  MITRAL VALVE               TRICUSPID VALVE MV Area (PHT):             TR Peak grad:   19.9 mmHg MV Decel Time:             TR Vmax:        223.00 cm/s MV E velocity: 63.50 cm/s  Estimated RAP:  3.00 mmHg MV A velocity: 67.40 cm/s  RVSP:           22.9 mmHg MV E/A ratio:  0.94 SHUNTS Systemic VTI:  0.25 m Systemic Diam: 2.10 cm  Armanda Magic MD Electronically signed by Armanda Magic MD Signature Date/Time: 07/03/2019/10:36:19 AM    Final             Risk Assessment/Calculations:    CHA2DS2-VASc Score = 4   This indicates a 4.8% annual risk of stroke. The patient's score is based upon: CHF History: 0 HTN History: 1 Diabetes History: 0 Stroke History:  0 Vascular Disease History: 1 Age Score: 2 Gender Score: 0    HYPERTENSION CONTROL Vitals:   01/13/23 0908 01/13/23 0924  BP: (!) 144/68 (!) 156/80    The patient's blood pressure is elevated above target today.  In order to address the patient's elevated BP: Follow up with primary care provider for management.; Follow up with general cardiology has been recommended.; Labs and/or other diagnostics are currently pending prior to making blood pressure medication adjustments. (prefers to wait for PCP appt tomorrow)          Physical Exam:   VS:  BP (!) 156/80   Pulse (!) 50   Ht 5\' 10"  (1.778 m)   Wt 208 lb 12.8 oz (94.7 kg)   SpO2 99%   BMI 29.96 kg/m    Wt Readings from Last 3 Encounters:  01/13/23 208 lb 12.8 oz (94.7 kg)  12/19/22 212 lb 12.8 oz (96.5 kg)  10/25/22 219 lb 9.6 oz (99.6 kg)    GEN: Well nourished, well developed in no acute distress NECK: No JVD; No carotid bruits CARDIAC: RRR, no murmurs, rubs, gallops RESPIRATORY:  Clear to auscultation without rales, wheezing or rhonchi  ABDOMEN: Soft, non-tender, non-distended EXTREMITIES:  No edema; No deformity   ASSESSMENT AND PLAN: .    PAF/hypercoagulable state - maintaining NSR by auscultation. CHA2DS2-VASc Score = 4 [CHF History: 0, HTN History: 1, Diabetes History: 0, Stroke History: 0, Vascular Disease History: 1, Age Score: 2, Gender Score: 0].  Therefore, the patient's annual risk of stroke is 4.8 %.    Continue Eliquis 5mg   BID. Denies bleeding complications.   HTN - BP not at goal <130/80 with clinic readings 144/68 and 156/80. Home readings 130s-160s. Home BP cuff found to be +6 systolic and accurate for diastolic. We discussed increasing hydrochlorothiazide to 25mg  daily with careful monitoring of BMP versus addition of Hydralazine 25mg  BID. He prefers to discuss with PCP at follow up visit next week. Discussed to monitor BP at home at least 2 hours after medications and sitting for 5-10 minutes.    Hyperkalemia - Improved to 5.4 on recent labs with PCP. Would avoid resuming ACE (Lisinopril) as could further escalate potassium. Not taking potassium supplement nor using electrolyte supplements that would contribute to hyperkalemia.  Nonocclusive coronary artery disease/HLD, LDL goal less than 70- Stable with no anginal symptoms. No indication for ischemic evaluation.  GDMT Rosuvastatin. Most recent LDL 70. No ASA due to [facility]. No BB due to bradycardia. Recommend aiming for 150 minutes of moderate intensity activity per week and following a heart healthy diet.         Dispo: follow up in 6 months  Signed, Alver Sorrow, NP

## 2023-01-19 DIAGNOSIS — E785 Hyperlipidemia, unspecified: Secondary | ICD-10-CM | POA: Diagnosis not present

## 2023-01-19 DIAGNOSIS — N1831 Chronic kidney disease, stage 3a: Secondary | ICD-10-CM | POA: Diagnosis not present

## 2023-01-19 DIAGNOSIS — E875 Hyperkalemia: Secondary | ICD-10-CM | POA: Diagnosis not present

## 2023-01-19 DIAGNOSIS — E039 Hypothyroidism, unspecified: Secondary | ICD-10-CM | POA: Diagnosis not present

## 2023-01-19 DIAGNOSIS — I480 Paroxysmal atrial fibrillation: Secondary | ICD-10-CM | POA: Diagnosis not present

## 2023-01-19 DIAGNOSIS — I129 Hypertensive chronic kidney disease with stage 1 through stage 4 chronic kidney disease, or unspecified chronic kidney disease: Secondary | ICD-10-CM | POA: Diagnosis not present

## 2023-01-19 DIAGNOSIS — E1169 Type 2 diabetes mellitus with other specified complication: Secondary | ICD-10-CM | POA: Diagnosis not present

## 2023-01-23 DIAGNOSIS — C61 Malignant neoplasm of prostate: Secondary | ICD-10-CM | POA: Diagnosis not present

## 2023-01-26 DIAGNOSIS — N529 Male erectile dysfunction, unspecified: Secondary | ICD-10-CM | POA: Diagnosis not present

## 2023-01-26 DIAGNOSIS — R339 Retention of urine, unspecified: Secondary | ICD-10-CM | POA: Diagnosis not present

## 2023-01-26 DIAGNOSIS — Z9359 Other cystostomy status: Secondary | ICD-10-CM | POA: Diagnosis not present

## 2023-01-26 DIAGNOSIS — N35013 Post-traumatic anterior urethral stricture: Secondary | ICD-10-CM | POA: Diagnosis not present

## 2023-01-26 DIAGNOSIS — C61 Malignant neoplasm of prostate: Secondary | ICD-10-CM | POA: Diagnosis not present

## 2023-02-08 ENCOUNTER — Encounter: Payer: Self-pay | Admitting: Internal Medicine

## 2023-02-21 DIAGNOSIS — R339 Retention of urine, unspecified: Secondary | ICD-10-CM | POA: Diagnosis not present

## 2023-02-21 DIAGNOSIS — Z466 Encounter for fitting and adjustment of urinary device: Secondary | ICD-10-CM | POA: Diagnosis not present

## 2023-02-21 DIAGNOSIS — Z9359 Other cystostomy status: Secondary | ICD-10-CM | POA: Diagnosis not present

## 2023-02-22 ENCOUNTER — Other Ambulatory Visit: Payer: Self-pay | Admitting: Cardiology

## 2023-02-22 DIAGNOSIS — I480 Paroxysmal atrial fibrillation: Secondary | ICD-10-CM

## 2023-02-22 NOTE — Telephone Encounter (Signed)
Prescription refill request for Eliquis received. Indication:afib Last office visit:12/24 Scr:1.29  3/24 Age: 78 Weight:94.7  kg  Prescription refilled

## 2023-03-02 DIAGNOSIS — E039 Hypothyroidism, unspecified: Secondary | ICD-10-CM | POA: Diagnosis not present

## 2023-03-06 DIAGNOSIS — R8290 Unspecified abnormal findings in urine: Secondary | ICD-10-CM | POA: Diagnosis not present

## 2023-03-08 ENCOUNTER — Ambulatory Visit (INDEPENDENT_AMBULATORY_CARE_PROVIDER_SITE_OTHER): Payer: Medicare Other | Admitting: Gastroenterology

## 2023-03-08 ENCOUNTER — Encounter: Payer: Self-pay | Admitting: Gastroenterology

## 2023-03-08 ENCOUNTER — Telehealth: Payer: Self-pay

## 2023-03-08 VITALS — BP 138/70 | HR 55 | Ht 69.0 in | Wt 205.4 lb

## 2023-03-08 DIAGNOSIS — Z8719 Personal history of other diseases of the digestive system: Secondary | ICD-10-CM | POA: Diagnosis not present

## 2023-03-08 DIAGNOSIS — D132 Benign neoplasm of duodenum: Secondary | ICD-10-CM

## 2023-03-08 DIAGNOSIS — R634 Abnormal weight loss: Secondary | ICD-10-CM | POA: Diagnosis not present

## 2023-03-08 DIAGNOSIS — Z860100 Personal history of colon polyps, unspecified: Secondary | ICD-10-CM

## 2023-03-08 NOTE — Telephone Encounter (Signed)
 Pharmacy please advise on holding Eliquis  prior to EGD scheduled for 04/10/2023. Thank you.

## 2023-03-08 NOTE — Patient Instructions (Signed)
 You have been scheduled for an endoscopy. Please follow written instructions given to you at your visit today.  If you use inhalers (even only as needed), please bring them with you on the day of your procedure.  If you take any of the following medications, they will need to be adjusted prior to your procedure:   DO NOT TAKE 7 DAYS PRIOR TO TEST- Trulicity (dulaglutide) Ozempic, Wegovy (semaglutide) Mounjaro (tirzepatide) Bydureon Bcise (exanatide extended release)  DO NOT TAKE 1 DAY PRIOR TO YOUR TEST Rybelsus (semaglutide) Adlyxin (lixisenatide) Victoza (liraglutide) Byetta (exanatide) ___________________________________________________________________________   Due to recent changes in healthcare laws, you may see the results of your imaging and laboratory studies on MyChart before your provider has had a chance to review them.  We understand that in some cases there may be results that are confusing or concerning to you. Not all laboratory results come back in the same time frame and the provider may be waiting for multiple results in order to interpret others.  Please give us  48 hours in order for your provider to thoroughly review all the results before contacting the office for clarification of your results.  _______________________________________________________  If your blood pressure at your visit was 140/90 or greater, please contact your primary care physician to follow up on this.  _______________________________________________________  If you are age 79 or older, your body mass index should be between 23-30. Your Body mass index is 30.33 kg/m. If this is out of the aforementioned range listed, please consider follow up with your Primary Care Provider.  If you are age 79 orounger, your body mass index should be between 19-25. Your Body mass index is 30.33 kg/m. If this is out of the aformentioned range listed, please consider follow up with your Primary Care Provider.    ________________________________________________________  The [facility] providers would like to encourage you to use MYCHART to communicate with providers for non-urgent requests or questions.  Due to long hold times on the telephone, sending your provider a message by [facility] may be a faster and more efficient way to get a response.  Please allow 48 business hours for a response.  Please remember that this is for non-urgent requests.  _______________________________________________________ Thank you for trusting me with your gastrointestinal care!   Nestor Blower, PA

## 2023-03-08 NOTE — Progress Notes (Signed)
 Noted.

## 2023-03-08 NOTE — Telephone Encounter (Signed)
 [facility] Pre-operative Risk Assessment     Request for surgical clearance:     Endoscopy Procedure  What type of surgery is being performed?     EGD  When is this surgery scheduled?     04/10/23  What type of clearance is required ?   Pharmacy  Are there any medications that need to be held prior to surgery and how long? Eliquis  x2 days  Practice name and name of physician performing surgery?      [facility]  What is your office phone and fax number?      Phone- [phone]  Fax- [phone]  Anesthesia type (None, local, MAC, general) ?       MAC   Please route your response to [employer], CMA

## 2023-03-08 NOTE — Progress Notes (Signed)
 Chief Complaint: repeat EGD Primary GI MD: Dr. Abran  HPI: 78 year old male history of paroxysmal A-fib (on Eliquis ) s/p ablation x2, CAD, CKD, hyperlipidemia, hypertension, hypothyroidism, asymptomatic bradycardia, myasthenia gravis (on Imuran ) presents for evaluation of repeat EGD  Patient was last seen by Dr. Abran 10/2020.  At that time he was having normocytic anemia with positive stool.  He was placed on iron due to low ferritin.  Underwent EGD/colonoscopy.  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  EGD/colonoscopy done July 2022 overall unrevealing.  IDA thought to be nonspecific intermittent mucosal oozing from chronic anticoagulation therapy.  His last EGD was December 2023 and showed small residual adenoma s/p polypectomy with a repeat in 1 year.  CBC March 2024 with normal hemoglobin  Today patient states he is doing well.  Has no symptoms.  Denies nausea, vomiting, abdominal pain, change in bowel habits.  He does note an unintentional weight loss ongoing since October 2024.  States he has been changing around some diabetic medications including Jardiance and this is likely resulted in his weight loss.  States he has gone from 215 lbs Nov 2024 to 197 Feb 2024 based on home scale.  Wt Readings from Last 3 Encounters:  03/08/23 205 lb 6 oz (93.2 kg)  01/13/23 208 lb 12.8 oz (94.7 kg)  12/19/22 212 lb 12.8 oz (96.5 kg)     PREVIOUS GI WORKUP   EGD 01/03/2022 1. Incidental esophageal ring/ stricture  2. Small residual adenoma status post snare polypectomy.  - repeat 1 year  EGD 08/12/2020 1. Mild esophagitis  2. Patchy erythema of the gastric antrum status post biopsies  3. Diminutive duodenal polyp status post polypectomy  4. Otherwise unremarkable EGD.  Colonoscopy 07/2020 - One 3 mm polyp in the transverse colon, removed with a cold snare. Resected and retrieved.  - Diverticulosis in the left colon.  - Internal hemorrhoids.  - The  examination was otherwise normal on direct and retroflexion views. 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   has supra-pubic foley catheter put in 2016/sphincter removed in 2017 replaced with the catheter   ATRIAL FIBRILLATION ABLATION N/A 07/23/2019   Procedure: ATRIAL FIBRILLATION ABLATION;  Surgeon: Kelsie Agent, MD;  Location: [facility] INVASIVE CV LAB;  Service: Cardiovascular;  Laterality: N/A;   ATRIAL FIBRILLATION ABLATION N/A 02/06/2020   Procedure: ATRIAL FIBRILLATION ABLATION;  Surgeon: Kelsie Agent, MD;  Location: [facility] INVASIVE CV LAB;  Service:  Cardiovascular;  Laterality: N/A;   catheter ablationfor afib  2010   COLONOSCOPY  2022   PROSTATECTOMY  2009   TONSILLECTOMY      Current Outpatient Medications  Medication Sig Dispense Refill   amLODipine  (NORVASC ) 10 MG tablet 1 tablet Orally Once a day for 30 days     apixaban  (ELIQUIS ) 5 MG TABS tablet Take 1 tablet by mouth twice daily 180 tablet 1   azaTHIOprine  (IMURAN ) 50 MG tablet Take 1 tablet by mouth twice daily 180 tablet 0    cholecalciferol  (VITAMIN D3) 25 MCG (1000 UNIT) tablet Take 1,000 Units by mouth daily. 1 Tablet Daily     clotrimazole-betamethasone (LOTRISONE) cream Apply 1 application  topically 2 (two) times daily as needed (irritation).     CRANBERRY PO Take 1 tablet by mouth daily. 650 mg daily     Cyanocobalamin  5000 MCG CAPS Take 1 capsule by mouth daily.     DENTA 5000 PLUS 1.1 % CREA dental cream Take 1 Application by mouth at bedtime.     dronedarone  (MULTAQ ) 400 MG tablet Take 1 tablet (400 mg total) by mouth 2 (two) times daily with a meal. 60 tablet 6   ferrous sulfate  325 (65 FE) MG EC tablet Take 325 mg by mouth 2 (two) times daily.     Fish Oil OIL Take 1,000 mg by mouth daily.      gluconic acid-citric acid (RENACIDIN ) irrigation Irrigate with 30 mLs as directed every other day.     hydrochlorothiazide (HYDRODIURIL) 25 MG tablet Take 25 mg by mouth daily.     Incontinence Supplies (CATHETER EXTENSION TUBING) MISC Inject 1 fluid ounce into the vein as needed. Use as directed     JARDIANCE 10 MG TABS tablet Take 10 mg by mouth daily.     Leuprolide  Acetate (LUPRON  IJ) Inject as directed as needed (Prostate cancer).      levothyroxine  (SYNTHROID ) 200 MCG tablet Take 200 mcg by mouth daily before breakfast.     mirabegron  ER (MYRBETRIQ ) 50 MG TB24 tablet Take 50 mg by mouth as needed.     Multiple Vitamin (MULTIVITAMIN) tablet Take 1 tablet by mouth daily.     omeprazole (PRILOSEC) 20 MG capsule Take 20 mg by mouth daily.      rosuvastatin  (CRESTOR ) 10 MG tablet Take 10 mg by mouth daily.     vitamin C  (ASCORBIC ACID ) 500 MG tablet Take 500 mg by mouth daily.     Water  For Irrigation, Sterile (STERILE WATER  FOR IRRIGATION) Irrigate with 1,000,000 mLs as directed as needed. Use as directed     No current facility-administered medications for this visit.    Allergies as of 03/08/2023   (No Known Allergies)    Family History  Problem Relation Age of Onset   Breast cancer Mother     Parkinson's disease Father    Colon cancer Neg Hx    Esophageal cancer Neg Hx    Rectal cancer Neg Hx    Colon polyps Neg Hx    Stomach cancer Neg Hx     Social History   Socioeconomic History   Marital status: Married    Spouse name: Not on file   Number of children: 2   Years of education: Masters   Highest education level: Not on file  Occupational History   Occupation: Retired  Tobacco Use   Smoking status: Former    Current packs/day: 0.00    Types: Cigarettes    Quit date: 02/01/1980  Years since quitting: 43.1   Smokeless tobacco: Never   Tobacco comments:    Former smoker 04/14/22  Vaping Use   Vaping status: Never Used  Substance and Sexual Activity   Alcohol  use: Yes    Alcohol /week: 0.0 - 4.0 standard drinks of alcohol     Comment: 1 glass red wine daily 04/14/22   Drug use: No   Sexual activity: Not on file  Other Topics Concern   Not on file  Social History Narrative   Lives in [place] with spouse.   Right-handed.   2 cups caffeine daily.   Retired from associate professor   Social Drivers of Corporate Investment Banker Strain: Not on file  Food Insecurity: Not on file  Transportation Needs: Not on file  Physical Activity: Not on file  Stress: Not on file  Social Connections: Not on file  Intimate Partner Violence: Not on file    Review of Systems:    Constitutional: No weight loss, fever, chills, weakness or fatigue HEENT: Eyes: No change in vision               Ears, Nose, Throat:  No change in hearing or congestion Skin: No rash or itching Cardiovascular: No chest pain, chest pressure or palpitations   Respiratory: No SOB or cough Gastrointestinal: See HPI and otherwise negative Genitourinary: No dysuria or change in urinary frequency Neurological: No headache, dizziness or syncope Musculoskeletal: No new muscle or joint pain Hematologic: No bleeding or bruising Psychiatric: No history of depression or anxiety     Physical Exam:  Vital signs: BP 138/70 (BP Location: Left Arm, Patient Position: Sitting, Cuff Size: Normal)   Pulse (!) 55   Ht 5' 9 (1.753 m)   Wt 205 lb 6 oz (93.2 kg)   BMI 30.33 kg/m   Constitutional: NAD, Well developed, Well nourished, alert and cooperative Head:  Normocephalic and atraumatic. Eyes:   PEERL, EOMI. No icterus. Conjunctiva pink. Respiratory: Respirations even and unlabored. Lungs clear to auscultation bilaterally.   No wheezes, crackles, or rhonchi.  Cardiovascular:  Regular rate and rhythm. No peripheral edema, cyanosis or pallor.  Rectal:  Not performed.  Msk:  Symmetrical without gross deformities. Without edema, no deformity or joint abnormality.  Neurologic:  Alert and  oriented x4;  grossly normal neurologically.  Skin:   Dry and intact without significant lesions or rashes. Psychiatric: Oriented to person, place and time. Demonstrates good judgement and reason without abnormal affect or behaviors.  RELEVANT LABS AND IMAGING: CBC    Component Value Date/Time   WBC 5.3 04/14/2022 0930   RBC 4.24 04/14/2022 0930   HGB 14.3 04/14/2022 0930   HGB 12.0 (L) 01/20/2020 1237   HCT 42.6 04/14/2022 0930   HCT 35.6 (L) 01/20/2020 1237   PLT 224 04/14/2022 0930   PLT 387 01/20/2020 1237   MCV 100.5 (H) 04/14/2022 0930   MCV 96 01/20/2020 1237   MCH 33.7 04/14/2022 0930   MCHC 33.6 04/14/2022 0930   RDW 12.6 04/14/2022 0930   RDW 14.1 01/20/2020 1237   LYMPHSABS 0.9 11/12/2020 0910   LYMPHSABS 1.1 01/20/2020 1237   MONOABS 0.5 11/12/2020 0910   EOSABS 0.1 11/12/2020 0910   EOSABS 0.0 01/20/2020 1237   BASOSABS 0.0 11/12/2020 0910   BASOSABS 0.0 01/20/2020 1237    CMP     Component Value Date/Time   NA 139 04/14/2022 0930   NA 139 01/20/2020 1237   K 4.5 04/14/2022 0930   CL 106  04/14/2022 0930   CO2 23 04/14/2022 0930   GLUCOSE 132 (H) 04/14/2022 0930   BUN 13 04/14/2022 0930   BUN 17 01/20/2020 1237   CREATININE 1.29 (H) 04/14/2022 0930    CALCIUM  9.5 04/14/2022 0930   PROT 7.1 04/14/2022 0930   PROT 6.6 10/08/2015 1034   ALBUMIN 4.2 04/14/2022 0930   ALBUMIN 4.4 10/08/2015 1034   AST 31 04/14/2022 0930   ALT 27 04/14/2022 0930   ALKPHOS 51 04/14/2022 0930   BILITOT 0.8 04/14/2022 0930   BILITOT 1.0 10/08/2015 1034   GFRNONAA 57 (L) 04/14/2022 0930   GFRAA 78 01/20/2020 1237     Assessment/Plan:   History of duodenal adenoma Weight loss Due for repeat EGD.  Last EGD in 2023 with small residual adenoma s/p polypectomy.  No symptoms.  Incidental unintentional weight loss likely secondary to diabetic medication changes. - Continue to monitor weight loss - EGD for further evaluation - I thoroughly discussed the procedure with the patient (at bedside) to include nature of the procedure, alternatives, benefits, and risks (including but not limited to bleeding, infection, perforation, anesthesia/cardiac pulmonary complications).  Patient verbalized understanding and gave verbal consent to proceed with procedure. - Will hold Eliquis  2 days prior to endoscopic procedures - will instruct when and how to resume after procedure. Benefits and risks of procedure explained including risks of bleeding, perforation, infection, missed lesions, reactions to medications and possible need for hospitalization and surgery for complications. Additional rare but real risk of stroke or other vascular clotting events off Eliquis  also explained and need to seek urgent help if any signs of these problems occur. Will communicate by phone or EMR with patient's  prescribing provider to confirm that holding Eliquis  is reasonable in this case.    History of colon polyps Last colonoscopy 2022, repeat recommended 5 years - Surveillance colonoscopy if patient is an adequate condition due 2027  Nestor Blower, PA-C [facility] 03/08/2023, 12:16 PM  Cc: [name], [name], MD

## 2023-03-13 NOTE — Telephone Encounter (Signed)
 Patient with diagnosis of afib on Eliquis  for anticoagulation.    Procedure: Endoscopy Procedure  Date of procedure: 04/10/22   CHA2DS2-VASc Score = 4   This indicates a 4.8% annual risk of stroke. The patient's score is based upon: CHF History: 0 HTN History: 1 Diabetes History: 0 Stroke History: 0 Vascular Disease History: 1 Age Score: 2 Gender Score: 0      CrCl 53 ml/min Platelet count 224  Per office protocol, patient can hold Eliquis  for 2 days prior to procedure.    **This guidance is not considered finalized until pre-operative APP has relayed final recommendations.**

## 2023-03-13 NOTE — Telephone Encounter (Signed)
   Primary Cardiologist: Sheryle Donning, MD  Chart reviewed as part of pre-operative protocol coverage. Given past medical history and time since last visit, based on ACC/AHA guidelines, Eugene Castaneda would be at acceptable risk for the planned procedure without further cardiovascular testing.   Patient was advised that if he develops new symptoms prior to surgery to contact our office to arrange a follow-up appointment.  He verbalized understanding.  Per office protocol, patient can hold Eliquis  for 2 days prior to procedure.   I will route this recommendation to the requesting party via Epic fax function and remove from pre-op pool.  Please call with questions.  Gerldine Koch, NP-C  03/13/2023, 3:34 PM [address] Office [phone] Fax [phone]

## 2023-03-14 NOTE — Telephone Encounter (Signed)
Patient informed to hold Eliquis for 2 days  Pt understood to take last dose on 04/07/23 until after procedure on 04/10/23  Pt was notified through phone call on 03/14/23 @ 9:20am

## 2023-03-16 ENCOUNTER — Other Ambulatory Visit (HOSPITAL_COMMUNITY): Payer: Self-pay

## 2023-03-17 ENCOUNTER — Other Ambulatory Visit (HOSPITAL_BASED_OUTPATIENT_CLINIC_OR_DEPARTMENT_OTHER): Payer: Self-pay

## 2023-03-17 MED ORDER — STERILE WATER FOR IRRIGATION IR SOLN
11 refills | Status: AC
Start: 2022-09-01 — End: ?
  Filled 2023-03-17: qty 500, 30d supply, fill #0
  Filled 2023-05-09: qty 500, 30d supply, fill #1

## 2023-03-20 ENCOUNTER — Other Ambulatory Visit (HOSPITAL_BASED_OUTPATIENT_CLINIC_OR_DEPARTMENT_OTHER): Payer: Self-pay

## 2023-03-21 ENCOUNTER — Other Ambulatory Visit: Payer: Self-pay

## 2023-03-21 ENCOUNTER — Other Ambulatory Visit: Payer: Self-pay | Admitting: Neurology

## 2023-03-21 ENCOUNTER — Other Ambulatory Visit (HOSPITAL_BASED_OUTPATIENT_CLINIC_OR_DEPARTMENT_OTHER): Payer: Self-pay

## 2023-03-21 DIAGNOSIS — Z466 Encounter for fitting and adjustment of urinary device: Secondary | ICD-10-CM | POA: Diagnosis not present

## 2023-03-21 DIAGNOSIS — R339 Retention of urine, unspecified: Secondary | ICD-10-CM | POA: Diagnosis not present

## 2023-03-23 ENCOUNTER — Encounter: Payer: Self-pay | Admitting: Neurology

## 2023-03-23 MED ORDER — AZATHIOPRINE 50 MG PO TABS: ORAL_TABLET | ORAL | 3 refills | Status: DC

## 2023-03-24 ENCOUNTER — Other Ambulatory Visit (HOSPITAL_BASED_OUTPATIENT_CLINIC_OR_DEPARTMENT_OTHER): Payer: Self-pay

## 2023-03-24 DIAGNOSIS — E785 Hyperlipidemia, unspecified: Secondary | ICD-10-CM | POA: Diagnosis not present

## 2023-03-24 DIAGNOSIS — E1169 Type 2 diabetes mellitus with other specified complication: Secondary | ICD-10-CM | POA: Diagnosis not present

## 2023-03-24 DIAGNOSIS — I480 Paroxysmal atrial fibrillation: Secondary | ICD-10-CM | POA: Diagnosis not present

## 2023-03-24 DIAGNOSIS — Z9359 Other cystostomy status: Secondary | ICD-10-CM | POA: Diagnosis not present

## 2023-03-24 DIAGNOSIS — I2510 Atherosclerotic heart disease of native coronary artery without angina pectoris: Secondary | ICD-10-CM | POA: Diagnosis not present

## 2023-03-24 DIAGNOSIS — I129 Hypertensive chronic kidney disease with stage 1 through stage 4 chronic kidney disease, or unspecified chronic kidney disease: Secondary | ICD-10-CM | POA: Diagnosis not present

## 2023-03-24 DIAGNOSIS — G7000 Myasthenia gravis without (acute) exacerbation: Secondary | ICD-10-CM | POA: Diagnosis not present

## 2023-03-24 DIAGNOSIS — C61 Malignant neoplasm of prostate: Secondary | ICD-10-CM | POA: Diagnosis not present

## 2023-03-24 DIAGNOSIS — M1990 Unspecified osteoarthritis, unspecified site: Secondary | ICD-10-CM | POA: Diagnosis not present

## 2023-03-24 DIAGNOSIS — N1831 Chronic kidney disease, stage 3a: Secondary | ICD-10-CM | POA: Diagnosis not present

## 2023-03-24 DIAGNOSIS — H9190 Unspecified hearing loss, unspecified ear: Secondary | ICD-10-CM | POA: Diagnosis not present

## 2023-03-24 DIAGNOSIS — E039 Hypothyroidism, unspecified: Secondary | ICD-10-CM | POA: Diagnosis not present

## 2023-03-24 IMAGING — MR MR LUMBAR SPINE W/O CM
4 of 5 series · 19 of 48 positions shown · non-contrast
Comparison: Lumbar radiographs 10/14/2020.

CLINICAL DATA: 75-year-old male with right side low back pain
radiating for 4 months. No known injury but history of prostate
cancer.

EXAM:
MRI LUMBAR SPINE WITHOUT CONTRAST
TECHNIQUE: Multiplanar, multisequence MR imaging of the lumbar spine was
performed. No intravenous contrast was administered.

[Series 5: T2 · sagittal · 4.0mm · 0.88mm/px · 6 of 17 slices shown (1 of 2)]
[im 1/17]
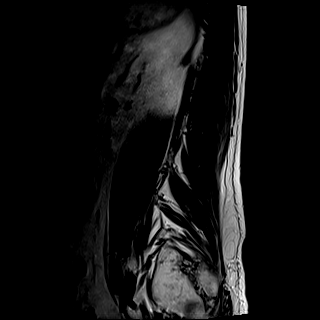
[im 4/17]
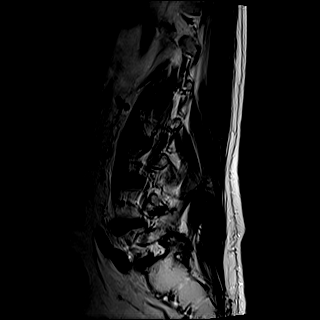
[im 7/17]
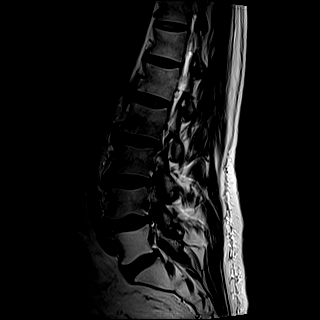
[im 10/17]
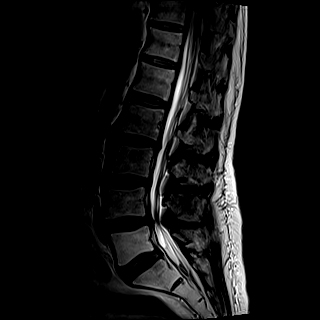
[im 13/17]
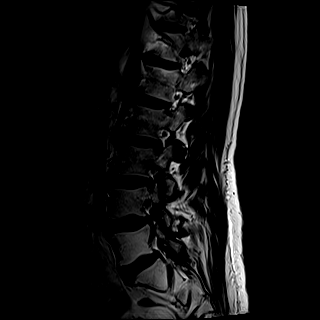
[im 17/17]
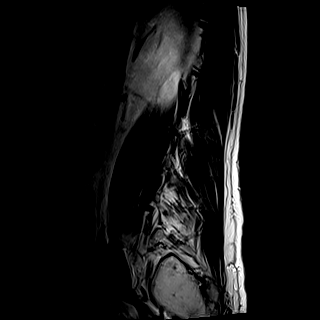

[Series 6: T1 · sagittal · 4.0mm · 0.88mm/px · 3 of 17 slices shown (1 of 2)]
[im 4/17]
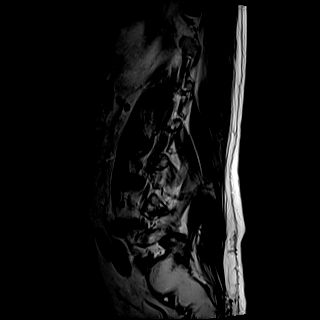
[im 10/17]
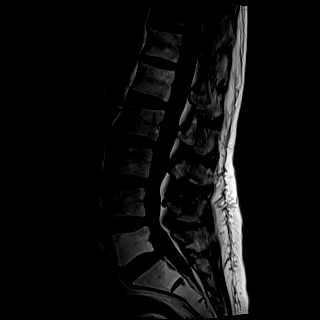
[im 17/17]
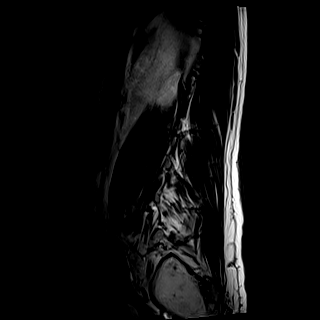

[Series 10: T1 · axial · 4.0mm · 0.28mm/px · z∈[-27,+142]mm · 3 of 44 slices shown (2 of 2)]
[im 7/44]
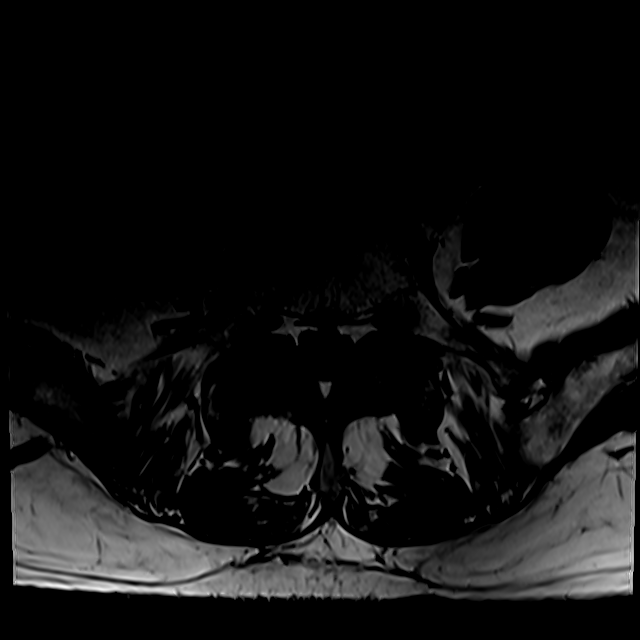
[im 22/44]
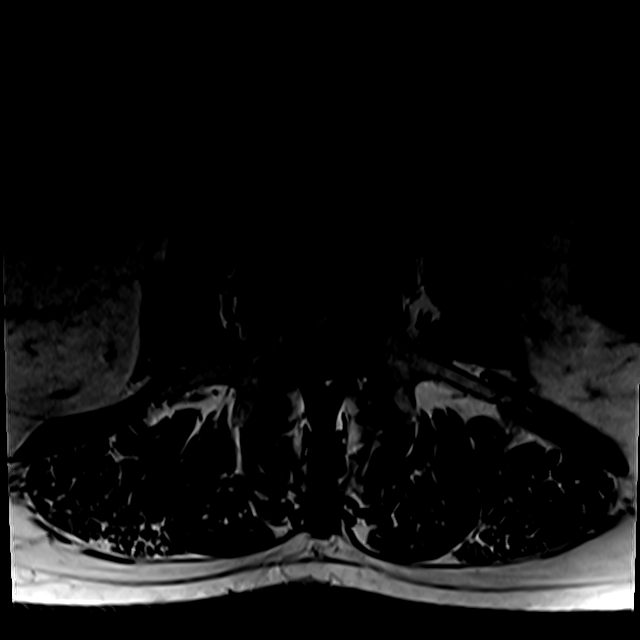
[im 37/44]
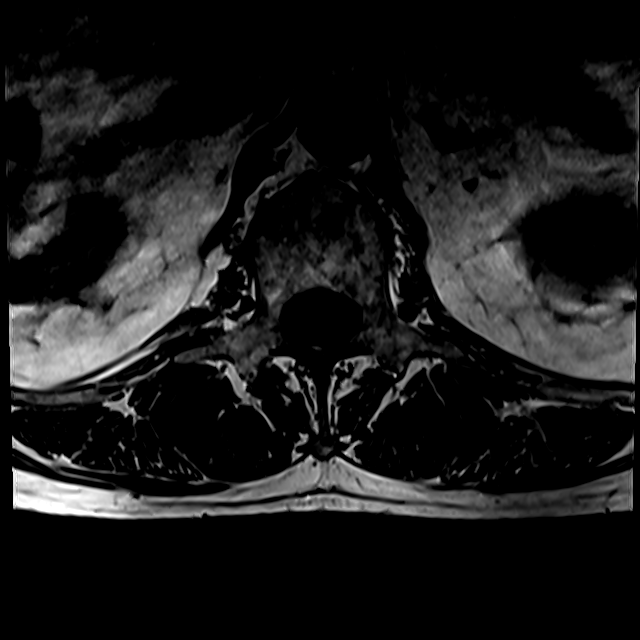

[Series 13: T2 · axial · 4.0mm · 0.28mm/px · z∈[-57,+142]mm · 7 of 44 slices shown (2 of 2)]
[im 1/44]
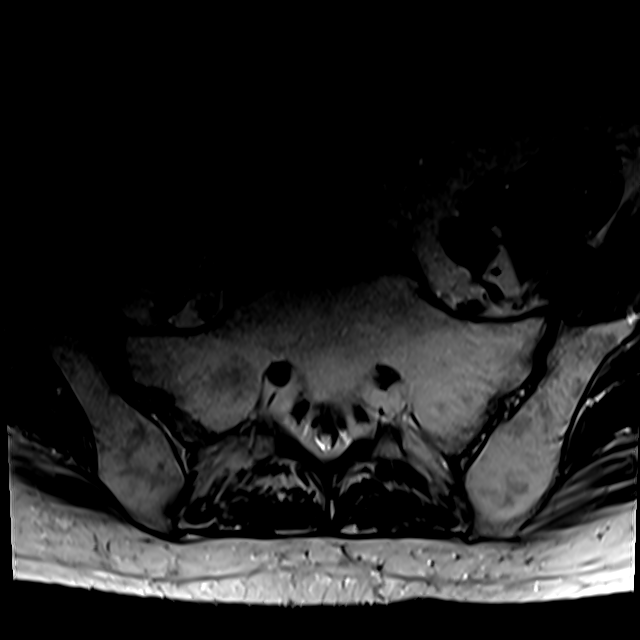
[im 7/44]
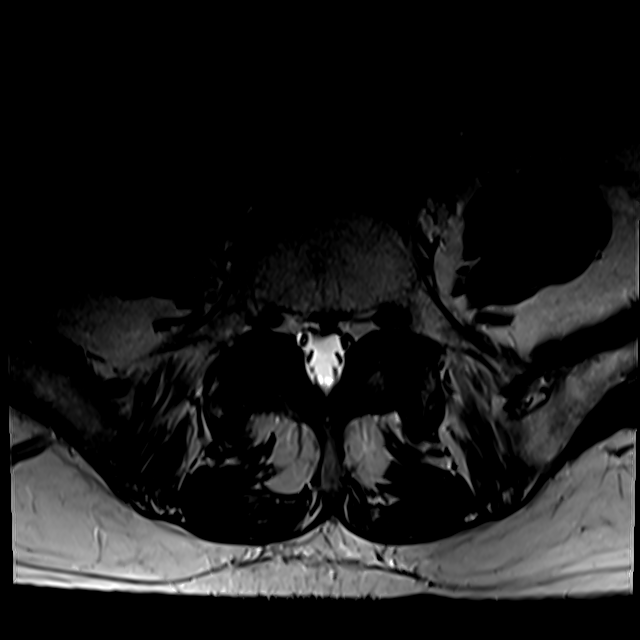
[im 13/44]
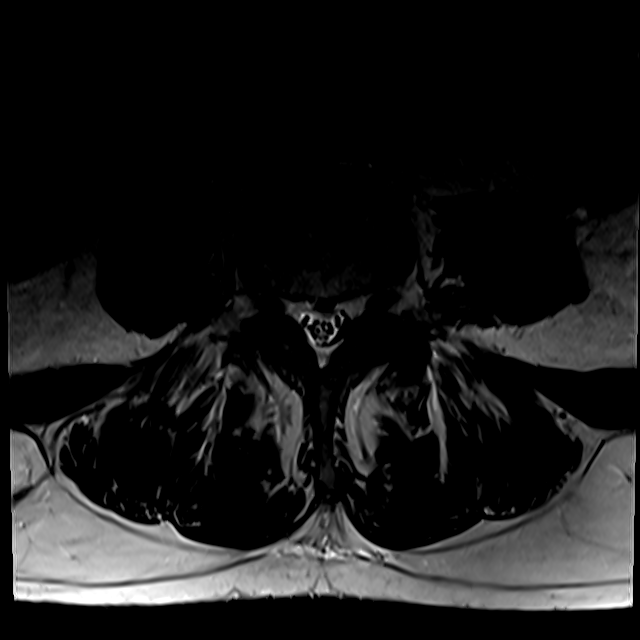
[im 19/44]
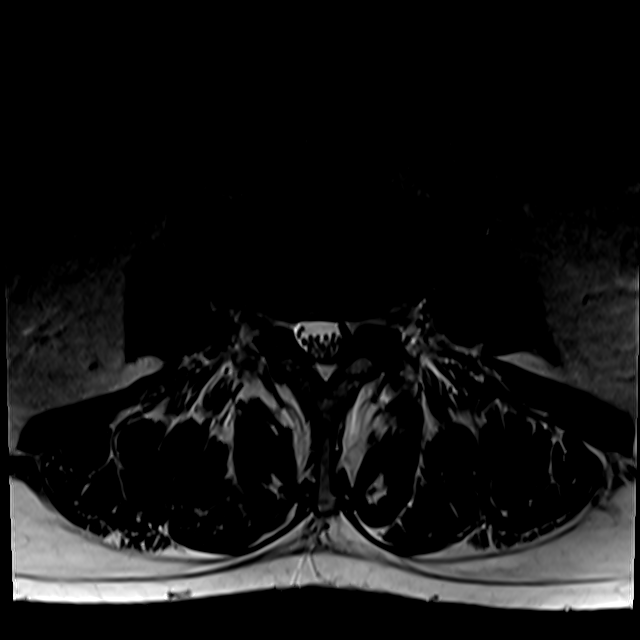
[im 22/44]
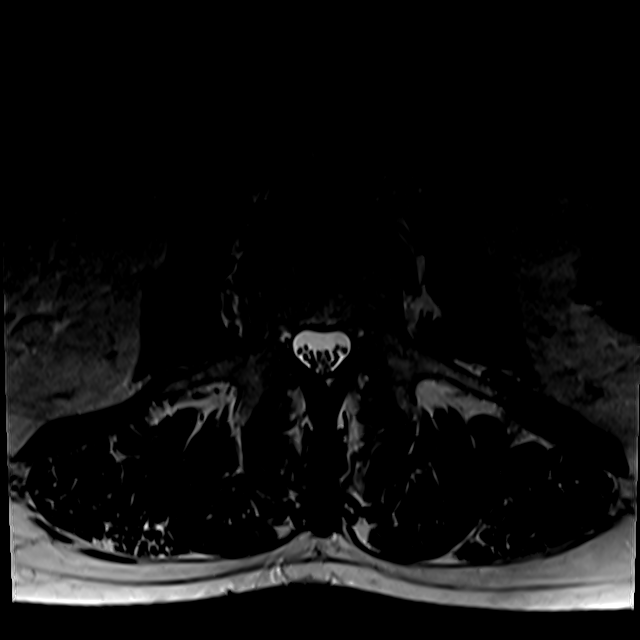
[im 25/44]
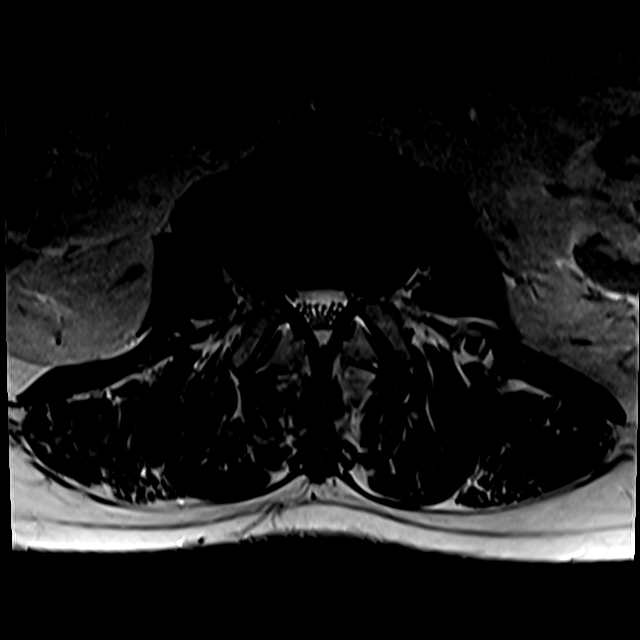
[im 37/44]
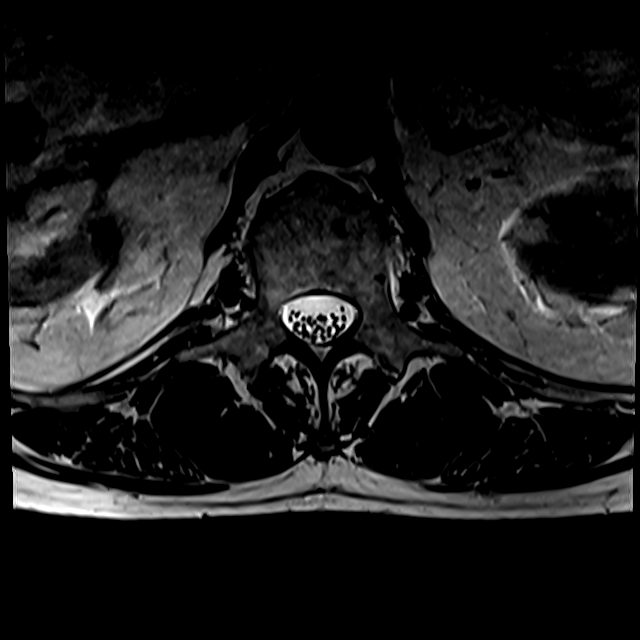

[19 of 48 positions shown; findings below may reference images not displayed]

FINDINGS: Segmentation:  Normal on the comparison radiographs.

Alignment: Lumbar lordosis with subtle degenerative appearing
retrolisthesis of L3 on L4. Similar mild retrolisthesis of L2 on L3
which was more pronounced on the comparison radiographs.

Vertebrae: Intrinsic increased T1 marrow signal in the L5 level and
visible pelvis likely related to previous prostate cancer radiation
therapy. Visible bone marrow signal elsewhere remains within normal
limits. No marrow edema or evidence of acute osseous abnormality.

Conus medullaris and cauda equina: Conus extends to the T12-L1
level. No lower spinal cord or conus signal abnormality.

Paraspinal and other soft tissues: Negative.

Disc levels:

T11-T12: Negative.

T12-L1:  Negative.

L1-L2:  Negative.

L2-L3: Disc desiccation, disc space loss and mild circumferential
disc bulge. Borderline to mild facet hypertrophy. Asymmetric small
right foraminal component of disc. But no convincing stenosis.

L3-L4: Mild disc desiccation and circumferential disc bulge. Mild
facet and ligament flavum hypertrophy. Mild epidural lipomatosis. No
significant spinal stenosis. Mild bilateral L3 neural foraminal
stenosis.

L4-L5: Disc desiccation with circumferential disc bulge. Bulky
broad-based posterior component. Moderate to severe facet and
ligament flavum hypertrophy. Mild epidural lipomatosis. Severe
spinal stenosis (series 13, image 34). Fairly symmetric lateral
recess stenosis. Mild to moderate bilateral L4 foraminal stenosis,
perhaps greater on the left related to asymmetric facet spurring
there see series 13, image 33.

L5-S1: Negative disc. Moderate to severe facet hypertrophy. No
spinal or lateral recess stenosis. Borderline to mild L5 foraminal
stenosis more so on the right.
IMPRESSION: 1. Severe multifactorial spinal stenosis at L4-L5 related to bulky
disc and posterior element degeneration. Associated mild to moderate
bilateral L4 neural foraminal stenosis.
2. No other significant lumbar spinal stenosis. Up to mild neural
foraminal stenosis at the bilateral L3 and L5 nerve levels.
3. Post radiation changes to the bone marrow of the L5 vertebra and
visible pelvis.

## 2023-03-30 ENCOUNTER — Telehealth: Payer: Self-pay

## 2023-03-30 DIAGNOSIS — L111 Transient acantholytic dermatosis [Grover]: Secondary | ICD-10-CM | POA: Diagnosis not present

## 2023-03-30 DIAGNOSIS — L570 Actinic keratosis: Secondary | ICD-10-CM | POA: Diagnosis not present

## 2023-03-30 NOTE — Telephone Encounter (Signed)
 Cal to patient to review azathioprine dose, no answer. Unable to leave message. Mychart sent.

## 2023-04-10 ENCOUNTER — Ambulatory Visit (AMBULATORY_SURGERY_CENTER): Payer: Medicare Other | Admitting: Internal Medicine

## 2023-04-10 ENCOUNTER — Encounter: Payer: Self-pay | Admitting: Internal Medicine

## 2023-04-10 VITALS — BP 125/72 | HR 60 | Temp 97.8°F | Resp 16 | Ht 69.0 in | Wt 205.0 lb

## 2023-04-10 DIAGNOSIS — I10 Essential (primary) hypertension: Secondary | ICD-10-CM | POA: Diagnosis not present

## 2023-04-10 DIAGNOSIS — K219 Gastro-esophageal reflux disease without esophagitis: Secondary | ICD-10-CM | POA: Diagnosis not present

## 2023-04-10 DIAGNOSIS — K3189 Other diseases of stomach and duodenum: Secondary | ICD-10-CM

## 2023-04-10 DIAGNOSIS — K449 Diaphragmatic hernia without obstruction or gangrene: Secondary | ICD-10-CM | POA: Diagnosis not present

## 2023-04-10 DIAGNOSIS — E039 Hypothyroidism, unspecified: Secondary | ICD-10-CM | POA: Diagnosis not present

## 2023-04-10 DIAGNOSIS — I480 Paroxysmal atrial fibrillation: Secondary | ICD-10-CM | POA: Diagnosis not present

## 2023-04-10 DIAGNOSIS — D132 Benign neoplasm of duodenum: Secondary | ICD-10-CM

## 2023-04-10 DIAGNOSIS — I2510 Atherosclerotic heart disease of native coronary artery without angina pectoris: Secondary | ICD-10-CM | POA: Diagnosis not present

## 2023-04-10 DIAGNOSIS — D509 Iron deficiency anemia, unspecified: Secondary | ICD-10-CM

## 2023-04-10 DIAGNOSIS — K2100 Gastro-esophageal reflux disease with esophagitis, without bleeding: Secondary | ICD-10-CM

## 2023-04-10 DIAGNOSIS — R634 Abnormal weight loss: Secondary | ICD-10-CM | POA: Diagnosis not present

## 2023-04-10 MED ORDER — SODIUM CHLORIDE 0.9 % IV SOLN: 500.0000 mL | Freq: Once | INTRAVENOUS | Status: DC

## 2023-04-10 NOTE — Progress Notes (Signed)
 Pt resting comfortably. VSS. Airway intact. SBAR complete to RN. All questions answered.

## 2023-04-10 NOTE — Op Note (Signed)
 [facility] Patient Name: Eugene Castaneda Procedure Date: 04/10/2023 10:58 AM MRN: [id number] Endoscopist: Wilhemina Bonito. Marina Goodell , MD, [id number] Age: 78 Referring MD:  Date of Birth: Sep 11, 1945 Gender: Male Account #: [id number] Procedure:                Upper GI endoscopy with biopsies Indications:              Surveillance procedure (history of diminutive                            duodenal adenoma), Esophageal reflux Medicines:                Monitored Anesthesia Care Procedure:                Pre-Anesthesia Assessment:                           - Prior to the procedure, a History and Physical                            was performed, and patient medications and                            allergies were reviewed. The patient's tolerance of                            previous anesthesia was also reviewed. The risks                            and benefits of the procedure and the sedation                            options and risks were discussed with the patient.                            All questions were answered, and informed consent                            was obtained. Prior Anticoagulants: The patient has                            taken Eliquis (apixaban), last dose was 3 days                            prior to procedure. ASA Grade Assessment: III - A                            patient with severe systemic disease. After                            reviewing the risks and benefits, the patient was                            deemed in satisfactory condition to undergo the  procedure.                           After obtaining informed consent, the endoscope was                            passed under direct vision. Throughout the                            procedure, the patient's blood pressure, pulse, and                            oxygen saturations were monitored continuously. The                            Olympus Scope SN [id number] was  introduced through the                            mouth, and advanced to the second part of duodenum.                            The upper GI endoscopy was accomplished without                            difficulty. The patient tolerated the procedure                            well. Scope In: Scope Out: Findings:                 The esophagus revealed mild edema and large caliber                            ring at the distalmost portion (40 cm,                            gastroesophageal junction).                           The stomach revealed sliding hiatal hernia gastric                            mucosa with mosaic type pattern.                           The examined duodenum revealed evidence of residual                            duodenal adenoma (2 to 3 mm). This was removed with                            cold biopsy forceps. The duodenal bulb revealed                            micro nodules, possibly adenomas.. Biopsies were  taken with a cold forceps for histology. The                            duodenum was otherwise normal                           The cardia and gastric fundus were normal on                            retroflexion. Complications:            No immediate complications. Estimated Blood Loss:     Estimated blood loss: none. Impression:               1. GERD with mild esophagitis and large caliber ring                           2. Hiatal hernia                           3. Diminutive duodenal adenoma. Removed with cold                            biopsy forceps. Diminutive bulbar nodules. Biopsied. Recommendation:           - Patient has a contact number available for                            emergencies. The signs and symptoms of potential                            delayed complications were discussed with the                            patient. Return to normal activities tomorrow.                            Written discharge  instructions were provided to the                            patient.                           - Resume previous diet.                           - Continue present medications.                           - Follow-up biopsies                           - Eliquis today Auguste Tebbetts N. Marina Goodell, MD 04/10/2023 11:26:58 AM This report has been signed electronically.

## 2023-04-10 NOTE — Progress Notes (Signed)
 Pt's states no medical or surgical changes since previsit or office visit.

## 2023-04-10 NOTE — Patient Instructions (Signed)
-  await pathology results -Continue present medications     YOU HAD AN ENDOSCOPIC PROCEDURE TODAY AT THE [facility]:   Refer to the procedure report that was given to you for any specific questions about what was found during the examination.  If the procedure report does not answer your questions, please call your gastroenterologist to clarify.  If you requested that your care partner not be given the details of your procedure findings, then the procedure report has been included in a sealed envelope for you to review at your convenience later.  YOU SHOULD EXPECT: Some feelings of bloating in the abdomen. Passage of more gas than usual.  Walking can help get rid of the air that was put into your GI tract during the procedure and reduce the bloating. If you had a lower endoscopy (such as a colonoscopy or flexible sigmoidoscopy) you may notice spotting of blood in your stool or on the toilet paper. If you underwent a bowel prep for your procedure, you may not have a normal bowel movement for a few days.  Please Note:  You might notice some irritation and congestion in your nose or some drainage.  This is from the oxygen used during your procedure.  There is no need for concern and it should clear up in a day or so.  SYMPTOMS TO REPORT IMMEDIATELY:  Following upper endoscopy (EGD)  Vomiting of blood or coffee ground material  New chest pain or pain under the shoulder blades  Painful or persistently difficult swallowing  New shortness of breath  Fever of 100F or higher  Black, tarry-looking stools  For urgent or emergent issues, a gastroenterologist can be reached at any hour by calling [phone]. Do not use MyChart messaging for urgent concerns.    DIET:  We do recommend a small meal at first, but then you may proceed to your regular diet.  Drink plenty of fluids but you should avoid alcoholic beverages for 24 hours.  ACTIVITY:  You should plan to take it easy for the rest  of today and you should NOT DRIVE or use heavy machinery until tomorrow (because of the sedation medicines used during the test).    FOLLOW UP: Our staff will call the number listed on your records the next business day following your procedure.  We will call around 7:15- 8:00 am to check on you and address any questions or concerns that you may have regarding the information given to you following your procedure. If we do not reach you, we will leave a message.     If any biopsies were taken you will be contacted by phone or by letter within the next 1-3 weeks.  Please call us at [phone] if you have not heard about the biopsies in 3 weeks.    SIGNATURES/CONFIDENTIALITY: You and/or your care partner have signed paperwork which will be entered into your electronic medical record.  These signatures attest to the fact that that the information above on your After Visit Summary has been reviewed and is understood.  Full responsibility of the confidentiality of this discharge information lies with you and/or your care-partner.  

## 2023-04-10 NOTE — Progress Notes (Signed)
 Expand All Collapse All    Chief Complaint: repeat EGD Primary GI MD: Dr. Marina Goodell   HPI: 78 year old male history of paroxysmal A-fib (on Eliquis) s/p ablation x2, CAD, CKD, hyperlipidemia, hypertension, hypothyroidism, asymptomatic bradycardia, myasthenia gravis (on Imuran) presents for evaluation of repeat EGD   Patient was last seen by Dr. Marina Goodell 10/2020.  At that time he was having normocytic anemia with positive stool.  He was placed on iron due to low ferritin.  Underwent EGD/colonoscopy.  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  EGD/colonoscopy done July 2022 overall unrevealing.  IDA thought to be nonspecific intermittent mucosal oozing from chronic anticoagulation therapy.  His last EGD was December 2023 and showed small residual adenoma s/p polypectomy with a repeat in 1 year.  CBC March 2024 with normal hemoglobin   Today patient states he is doing well.  Has no symptoms.  Denies nausea, vomiting, abdominal pain, change in bowel habits.   He does note an unintentional weight loss ongoing since October 2024.  States he has been changing around some diabetic medications including Jardiance and this is likely resulted in his weight loss.  States he has gone from 215 lbs Nov 2024 to 197 Feb 2024 based on home scale.      Wt Readings from Last 3 Encounters:  03/08/23 205 lb 6 oz (93.2 kg)  01/13/23 208 lb 12.8 oz (94.7 kg)  12/19/22 212 lb 12.8 oz (96.5 kg)      PREVIOUS GI WORKUP    EGD 01/03/2022 1. Incidental esophageal ring/ stricture  2. Small residual adenoma status post snare polypectomy.  - repeat 1 year   EGD 08/12/2020 1. Mild esophagitis  2. Patchy erythema of the gastric antrum status post biopsies  3. Diminutive duodenal polyp status post polypectomy  4. Otherwise unremarkable EGD.   Colonoscopy 07/2020 - One 3 mm polyp in the transverse colon, removed with a cold snare. Resected and retrieved.  - Diverticulosis in the left  colon.  - Internal hemorrhoids.  - The examination was otherwise normal on direct and retroflexion views. - Repeat 5 years       Past Medical History:  Diagnosis Date   Anemia      on meds   CAD (coronary artery disease), native coronary artery 11/13/2017    Coronary calcification noted on CT scan    Chronic kidney disease (CKD), stage III (moderate) (HCC) 11/13/2017    has suprapubic catheter   Diplopia     Foley catheter present      pt has supra-pubic foley catheter in place/since 2016   GERD (gastroesophageal reflux disease)      on meds   Hyperlipemia      on meds   Hypertension      on meds   Hypertensive heart disease without CHF 11/13/2017   Hypothyroidism      on meds   Impaired fasting glucose     MG (myasthenia gravis) (HCC)     Paroxysmal atrial fibrillation (HCC) 11/13/2017    RF ablation Dr. 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    has supra-pubic foley catheter put in 2016/sphincter removed in 2017 replaced with the  catheter   ATRIAL FIBRILLATION ABLATION N/A 07/23/2019    Procedure: ATRIAL FIBRILLATION ABLATION;  Surgeon: Hillis Range, MD;  Location: [facility] INV[facility]IVE [facility];  Service: Cardiovascular;  Laterality: N/A;   ATRIAL FIBRILLATION ABLATION N/A 02/06/2020    Procedure: ATRIAL FIBRILLATION ABLATION;  Surgeon: Hillis Range, MD;  Location: [facility] INVASIVE CV LAB;  Service: Cardiovascular;  Laterality: N/A;   catheter ablationfor afib   2010   COLONOSCOPY   2022   PROSTATECTOMY   2009   TONSILLECTOMY                    Current Outpatient Medications  Medication Sig Dispense Refill   amLODipine (NORVASC) 10 MG tablet 1 tablet Orally Once a day for 30 days       apixaban (ELIQUIS) 5 MG TABS tablet Take 1 tablet by mouth twice  daily 180 tablet 1   azaTHIOprine (IMURAN) 50 MG tablet Take 1 tablet by mouth twice daily 180 tablet 0   cholecalciferol (VITAMIN D3) 25 MCG (1000 UNIT) tablet Take 1,000 Units by mouth daily. 1 Tablet Daily       clotrimazole-betamethasone (LOTRISONE) cream Apply 1 application  topically 2 (two) times daily as needed (irritation).       CRANBERRY PO Take 1 tablet by mouth daily. 650 mg daily       Cyanocobalamin 5000 MCG CAPS Take 1 capsule by mouth daily.       DENTA 5000 PLUS 1.1 % CREA dental cream Take 1 Application by mouth at bedtime.       dronedarone (MULTAQ) 400 MG tablet Take 1 tablet (400 mg total) by mouth 2 (two) times daily with a meal. 60 tablet 6   ferrous sulfate 325 (65 FE) MG EC tablet Take 325 mg by mouth 2 (two) times daily.       Fish Oil OIL Take 1,000 mg by mouth daily.        gluconic acid-citric acid (RENACIDIN) irrigation Irrigate with 30 mLs as directed every other day.       hydrochlorothiazide (HYDRODIURIL) 25 MG tablet Take 25 mg by mouth daily.       Incontinence Supplies (CATHETER EXTENSION TUBING) MISC Inject 1 fluid ounce into the vein as needed. Use as directed       JARDIANCE 10 MG TABS tablet Take 10 mg by mouth daily.       Leuprolide Acetate (LUPRON IJ) Inject as directed as needed (Prostate cancer).        levothyroxine (SYNTHROID) 200 MCG tablet Take 200 mcg by mouth daily before breakfast.       mirabegron ER (MYRBETRIQ) 50 MG TB24 tablet Take 50 mg by mouth as needed.       Multiple Vitamin (MULTIVITAMIN) tablet Take 1 tablet by mouth daily.       omeprazole (PRILOSEC) 20 MG capsule Take 20 mg by mouth daily.        rosuvastatin (CRESTOR) 10 MG tablet Take 10 mg by mouth daily.       vitamin C (ASCORBIC ACID) 500 MG tablet Take 500 mg by mouth daily.       Water For Irrigation, Sterile (STERILE WATER FOR IRRIGATION) Irrigate with 1,000,000 mLs as directed as needed. Use as directed          No current facility-administered medications for this  visit.           Allergies as of 03/08/2023   (No Known Allergies)  Family History  Problem Relation Age of Onset   Breast cancer Mother     Parkinson's disease Father     Colon cancer Neg Hx     Esophageal cancer Neg Hx     Rectal cancer Neg Hx     Colon polyps Neg Hx     Stomach cancer Neg Hx            Social History         Socioeconomic History   Marital status: Married      Spouse name: Not on file   Number of children: 2   Years of education: Masters   Highest education level: Not on file  Occupational History   Occupation: Retired  Tobacco Use   Smoking status: Former      Current packs/day: 0.00      Types: Cigarettes      Quit date: 02/01/1980      Years since quitting: 43.1   Smokeless tobacco: Never   Tobacco comments:      Former smoker 04/14/22  Vaping Use   Vaping status: Never Used  Substance and Sexual Activity   Alcohol use: Yes      Alcohol/week: 0.0 - 4.0 standard drinks of alcohol      Comment: 1 glass red wine daily 04/14/22   Drug use: No   Sexual activity: Not on file  Other Topics Concern   Not on file  Social History Narrative    Lives in [place] with spouse.    Right-handed.    2 cups caffeine daily.    Retired from Associate Professor    Social Drivers of Manufacturing engineer Strain: Not on file  Food Insecurity: Not on file  Transportation Needs: Not on file  Physical Activity: Not on file  Stress: Not on file  Social Connections: Not on file  Intimate Partner Violence: Not on file      Review of Systems:    Constitutional: No weight loss, fever, chills, weakness or fatigue HEENT: Eyes: No change in vision               Ears, Nose, Throat:  No change in hearing or congestion Skin: No rash or itching Cardiovascular: No chest pain, chest pressure or palpitations   Respiratory: No SOB or cough Gastrointestinal: See HPI and otherwise negative Genitourinary: No dysuria or change in  urinary frequency Neurological: No headache, dizziness or syncope Musculoskeletal: No new muscle or joint pain Hematologic: No bleeding or bruising Psychiatric: No history of depression or anxiety      Physical Exam:  Vital signs: BP 138/70 (BP Location: Left Arm, Patient Position: Sitting, Cuff Size: Normal)   Pulse (!) 55   Ht 5\' 9"  (1.753 m)   Wt 205 lb 6 oz (93.2 kg)   BMI 30.33 kg/m    Constitutional: NAD, Well developed, Well nourished, alert and cooperative Head:  Normocephalic and atraumatic. Eyes:   PEERL, EOMI. No icterus. Conjunctiva pink. Respiratory: Respirations even and unlabored. Lungs clear to auscultation bilaterally.   No wheezes, crackles, or rhonchi.  Cardiovascular:  Regular rate and rhythm. No peripheral edema, cyanosis or pallor.  Rectal:  Not performed.  Msk:  Symmetrical without gross deformities. Without edema, no deformity or joint abnormality.  Neurologic:  Alert and  oriented x4;  grossly normal neurologically.  Skin:   Dry and intact without significant lesions or rashes. Psychiatric: Oriented to person, place and time. Demonstrates good judgement and reason without  abnormal affect or behaviors.   RELEVANT LABS AND IMAGING: CBC Labs (Brief)          Component Value Date/Time    WBC 5.3 04/14/2022 0930    RBC 4.24 04/14/2022 0930    HGB 14.3 04/14/2022 0930    HGB 12.0 (L) 01/20/2020 1237    HCT 42.6 04/14/2022 0930    HCT 35.6 (L) 01/20/2020 1237    PLT 224 04/14/2022 0930    PLT 387 01/20/2020 1237    MCV 100.5 (H) 04/14/2022 0930    MCV 96 01/20/2020 1237    MCH 33.7 04/14/2022 0930    MCHC 33.6 04/14/2022 0930    RDW 12.6 04/14/2022 0930    RDW 14.1 01/20/2020 1237    LYMPHSABS 0.9 11/12/2020 0910    LYMPHSABS 1.1 01/20/2020 1237    MONOABS 0.5 11/12/2020 0910    EOSABS 0.1 11/12/2020 0910    EOSABS 0.0 01/20/2020 1237    BASOSABS 0.0 11/12/2020 0910    BASOSABS 0.0 01/20/2020 1237        CMP     Labs (Brief)           Component Value Date/Time    NA 139 04/14/2022 0930    NA 139 01/20/2020 1237    K 4.5 04/14/2022 0930    CL 106 04/14/2022 0930    CO2 23 04/14/2022 0930    GLUCOSE 132 (H) 04/14/2022 0930    BUN 13 04/14/2022 0930    BUN 17 01/20/2020 1237    CREATININE 1.29 (H) 04/14/2022 0930    CALCIUM 9.5 04/14/2022 0930    PROT 7.1 04/14/2022 0930    PROT 6.6 10/08/2015 1034    ALBUMIN 4.2 04/14/2022 0930    ALBUMIN 4.4 10/08/2015 1034    AST 31 04/14/2022 0930    ALT 27 04/14/2022 0930    ALKPHOS 51 04/14/2022 0930    BILITOT 0.8 04/14/2022 0930    BILITOT 1.0 10/08/2015 1034    GFRNONAA 57 (L) 04/14/2022 0930    GFRAA 78 01/20/2020 1237          Assessment/Plan:    History of duodenal adenoma Weight loss Due for repeat EGD.  Last EGD in 2023 with small residual adenoma s/p polypectomy.  No symptoms.  Incidental unintentional weight loss likely secondary to diabetic medication changes. - Continue to monitor weight loss - EGD for further evaluation - I thoroughly discussed the procedure with the patient (at bedside) to include nature of the procedure, alternatives, benefits, and risks (including but not limited to bleeding, infection, perforation, anesthesia/cardiac pulmonary complications).  Patient verbalized understanding and gave verbal consent to proceed with procedure. - Will hold Eliquis 2 days prior to endoscopic procedures - will instruct when and how to resume after procedure. Benefits and risks of procedure explained including risks of bleeding, perforation, infection, missed lesions, reactions to medications and possible need for hospitalization and surgery for complications. Additional rare but real risk of stroke or other vascular clotting events off Eliquis also explained and need to seek urgent help if any signs of these problems occur. Will communicate by phone or EMR with patient's  prescribing provider to confirm that holding Eliquis is reasonable in this case.      History of colon polyps Last colonoscopy 2022, repeat recommended 5 years - Surveillance colonoscopy if patient is an adequate condition due 2027   Boone Master, PA-C [facility] 03/08/2023, 12:16 PM   Cc: Chilton Greathouse, MD

## 2023-04-10 NOTE — Progress Notes (Signed)
 Called to room to assist during endoscopic procedure.  Patient ID and intended procedure confirmed with present staff. Received instructions for my participation in the procedure from the performing physician.

## 2023-04-11 ENCOUNTER — Telehealth: Payer: Self-pay | Admitting: *Deleted

## 2023-04-11 NOTE — Telephone Encounter (Signed)
  Follow up Call-     04/10/2023   10:13 AM 01/03/2022    8:58 AM 08/12/2020   12:53 PM  Call back number  Post procedure Call Back phone  # [phone]  Permission to leave phone message Yes Yes Yes     Patient questions:  Do you have a fever, pain , or abdominal swelling? No. Pain Score  0 *  Have you tolerated food without any problems? Yes.    Have you been able to return to your normal activities? Yes.    Do you have any questions about your discharge instructions: Diet   No. Medications  No. Follow up visit  No.  Do you have questions or concerns about your Care? No.  Actions: * If pain score is 4 or above: No action needed, pain <4.

## 2023-04-12 ENCOUNTER — Encounter: Payer: Self-pay | Admitting: Internal Medicine

## 2023-04-12 LAB — SURGICAL PATHOLOGY

## 2023-04-19 DIAGNOSIS — Z9359 Other cystostomy status: Secondary | ICD-10-CM | POA: Diagnosis not present

## 2023-04-19 DIAGNOSIS — Z466 Encounter for fitting and adjustment of urinary device: Secondary | ICD-10-CM | POA: Diagnosis not present

## 2023-04-19 DIAGNOSIS — R339 Retention of urine, unspecified: Secondary | ICD-10-CM | POA: Diagnosis not present

## 2023-04-19 DIAGNOSIS — C61 Malignant neoplasm of prostate: Secondary | ICD-10-CM | POA: Diagnosis not present

## 2023-05-02 DIAGNOSIS — I10 Essential (primary) hypertension: Secondary | ICD-10-CM | POA: Diagnosis not present

## 2023-05-02 DIAGNOSIS — E039 Hypothyroidism, unspecified: Secondary | ICD-10-CM | POA: Diagnosis not present

## 2023-05-09 ENCOUNTER — Other Ambulatory Visit (HOSPITAL_BASED_OUTPATIENT_CLINIC_OR_DEPARTMENT_OTHER): Payer: Self-pay

## 2023-05-10 ENCOUNTER — Other Ambulatory Visit: Payer: Self-pay

## 2023-05-17 DIAGNOSIS — R339 Retention of urine, unspecified: Secondary | ICD-10-CM | POA: Diagnosis not present

## 2023-05-17 DIAGNOSIS — Z466 Encounter for fitting and adjustment of urinary device: Secondary | ICD-10-CM | POA: Diagnosis not present

## 2023-05-24 ENCOUNTER — Other Ambulatory Visit (HOSPITAL_COMMUNITY): Payer: Self-pay | Admitting: Physician Assistant

## 2023-06-19 ENCOUNTER — Ambulatory Visit (HOSPITAL_COMMUNITY): Payer: Medicare Other | Admitting: Physician Assistant

## 2023-06-21 DIAGNOSIS — R339 Retention of urine, unspecified: Secondary | ICD-10-CM | POA: Diagnosis not present

## 2023-06-21 DIAGNOSIS — Z9359 Other cystostomy status: Secondary | ICD-10-CM | POA: Diagnosis not present

## 2023-06-21 DIAGNOSIS — Z466 Encounter for fitting and adjustment of urinary device: Secondary | ICD-10-CM | POA: Diagnosis not present

## 2023-06-27 ENCOUNTER — Ambulatory Visit (HOSPITAL_COMMUNITY): Payer: Medicare Other | Admitting: Physician Assistant

## 2023-06-30 DIAGNOSIS — H25813 Combined forms of age-related cataract, bilateral: Secondary | ICD-10-CM | POA: Diagnosis not present

## 2023-06-30 DIAGNOSIS — H5203 Hypermetropia, bilateral: Secondary | ICD-10-CM | POA: Diagnosis not present

## 2023-06-30 DIAGNOSIS — E119 Type 2 diabetes mellitus without complications: Secondary | ICD-10-CM | POA: Diagnosis not present

## 2023-06-30 DIAGNOSIS — H52203 Unspecified astigmatism, bilateral: Secondary | ICD-10-CM | POA: Diagnosis not present

## 2023-07-03 ENCOUNTER — Ambulatory Visit (HOSPITAL_COMMUNITY): Payer: Medicare Other | Admitting: Physician Assistant

## 2023-07-07 ENCOUNTER — Ambulatory Visit (HOSPITAL_COMMUNITY)
Admission: RE | Admit: 2023-07-07 | Discharge: 2023-07-07 | Disposition: A | Discharge status: Home / Self Care | Source: Ambulatory Visit | Attending: Physician Assistant | Admitting: Physician Assistant

## 2023-07-07 VITALS — BP 162/64 | HR 43 | Ht 69.0 in | Wt 193.4 lb

## 2023-07-07 DIAGNOSIS — R001 Bradycardia, unspecified: Secondary | ICD-10-CM | POA: Insufficient documentation

## 2023-07-07 DIAGNOSIS — I480 Paroxysmal atrial fibrillation: Secondary | ICD-10-CM | POA: Insufficient documentation

## 2023-07-07 DIAGNOSIS — I4891 Unspecified atrial fibrillation: Secondary | ICD-10-CM | POA: Diagnosis not present

## 2023-07-07 NOTE — Patient Instructions (Signed)
Stop multaq

## 2023-07-07 NOTE — Progress Notes (Signed)
Primary Care Physician: [name], [name], MD Referring Physician: Dr. Nunzio Belch  Primary Cardiologist: Dr Veryl Gottron  Primary EP: Dr Arlester Ladd   Eugene Castaneda is a 78 y.o. male with a h/o paroxysmal afib, CAD, CKD, HLD, HTN, hypothyroidism who presents for follow up in the [facility]. Initial afib ablation several years ago with Dr. Harwood Lingo in [place]. He had been maintained on flecainide  without any daily AV nodal agents but this was discontinued due to CAD diagnosis. Transitioned to Multaq .   Patient returns for follow up for atrial fibrillation and Multaq  monitoring. Patient reports that he feels well and remains active. He did have one interim episode of afib noted on his Kardia mobile device associated with tachypalpitations. No bleeding on anticoagulation. He denies presyncope or dizziness.   Today, he  denies symptoms of chest pain, shortness of breath, orthopnea, PND, lower extremity edema, dizziness, presyncope, syncope, snoring, daytime somnolence, bleeding, or neurologic sequela. The patient is tolerating medications without difficulties and is otherwise without complaint today.    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 Harwood Lingo 2010 [facility] CHADSVASC 3   Prostate cancer [facility]) 2009   Urethral obstruction    Urinary  retention    Suprapubic Catheter placed 04/25/15    Current Outpatient Medications  Medication Sig Dispense Refill   amLODipine (NORVASC) 10 MG tablet 1 tablet Orally Once a day for 30 days     apixaban  (ELIQUIS ) 5 MG TABS tablet Take 1 tablet by mouth twice daily 180 tablet 1   azaTHIOprine  (IMURAN ) 50 MG tablet Take 0.5 tablets (25 mg total) by mouth in the morning AND 1 tablet (50 mg total) every evening. 180 tablet 3   cholecalciferol (VITAMIN D3) 25 MCG (1000 UNIT) tablet Take 1,000 Units by mouth daily. 1 Tablet Daily     clotrimazole-betamethasone (LOTRISONE) cream Apply 1 application  topically 2 (two) times daily as needed (irritation).     CRANBERRY PO Take 1 tablet by mouth daily. 650 mg daily     Cyanocobalamin 5000 MCG CAPS Take 1 capsule by mouth daily.     DENTA 5000 PLUS 1.1 % CREA dental cream Take 1 Application by mouth at bedtime.     ferrous sulfate 325 (65 FE) MG EC tablet Take 325 mg by mouth 2 (two) times daily.     Fish Oil OIL Take 1,000 mg by mouth daily.      gluconic acid-citric acid (RENACIDIN) irrigation Irrigate with 30 mLs as directed every other day.     hydrALAZINE (APRESOLINE) 25 MG tablet Take 25 mg by mouth 2 (two) times daily.     hydrochlorothiazide (HYDRODIURIL) 25 MG tablet Take 25 mg by mouth daily.     Incontinence Supplies (CATHETER  EXTENSION TUBING) MISC Inject 1 fluid ounce into the vein as needed. Use as directed     JARDIANCE 10 MG TABS tablet Take 10 mg by mouth daily.     Leuprolide  Acetate (LUPRON  IJ) Inject as directed as needed (Prostate cancer).      levothyroxine (SYNTHROID) 200 MCG tablet Take 200 mcg by mouth daily before breakfast.     levothyroxine (SYNTHROID) 25 MCG tablet Take 25 mcg by mouth every morning.     mirabegron ER (MYRBETRIQ) 50 MG TB24 tablet Take 50 mg by mouth as needed.     Multiple Vitamin (MULTIVITAMIN) tablet Take 1 tablet by mouth daily.     omeprazole (PRILOSEC) 20 MG capsule Take 20 mg by mouth daily.       rosuvastatin (CRESTOR) 10 MG tablet Take 10 mg by mouth daily.     vitamin C (ASCORBIC ACID) 500 MG tablet Take 500 mg by mouth daily.     Water  For Irrigation, Sterile (STERILE WATER  FOR IRRIGATION) Irrigate with 1,000,000 mLs as directed as needed. Use as directed     Water  For Irrigation, Sterile (STERILE WATER ) Use to flush catherter as directed 500 mL 11   No current facility-administered medications for this encounter.    ROS- All systems are reviewed and negative except as per the HPI above  Physical Exam: Vitals:   07/07/23 0813  BP: (!) 162/64  Pulse: (!) 43  Weight: 87.7 kg  Height: 5\' 9"  (1.753 m)    Wt Readings from Last 3 Encounters:  07/07/23 87.7 kg  04/10/23 93 kg  03/08/23 93.2 kg    GEN: Well nourished, well developed in no acute distress CARDIAC: Regular rate and rhythm with occasional ectopy, bradycardia, no murmurs, rubs, gallops RESPIRATORY:  Clear to auscultation without rales, wheezing or rhonchi  ABDOMEN: Soft, non-tender, non-distended EXTREMITIES:  No edema; No deformity    EKG today demonstrates SB transitioning to junctional rhythm, PAC Vent. rate 43 BPM PR interval * ms QRS duration 88 ms QT/QTcB 366/309 ms   CHA2DS2-VASc Score = 4  The patient's score is based upon: CHF History: 0 HTN History: 1 Diabetes History: 0 Stroke History: 0 Vascular Disease History: 1 Age Score: 2 Gender Score: 0       ASSESSMENT AND PLAN: Paroxysmal Atrial Fibrillation (ICD10:  I48.0) The patient's CHA2DS2-VASc score is 4, indicating a 4.8% annual risk of stroke.   S/p remote afib ablation by Dr Harwood Lingo, repeat ablation 02/06/20 with Dr Nunzio Belch Flecainide  discontinued due to CAD He is in SB today with junctional beats. He denies dizziness or presyncope. Will stop Multaq  today. We discussed alternate rhythm control options (dofetilide). He would prefer to pursue watchful waiting for now off AAD since he has had a low burden of afib.  Continue Eliquis  5  mg BID Kardia mobile for home monitoring.   Secondary Hypercoagulable State (ICD10:  D68.69) The patient is at significant risk for stroke/thromboembolism based upon his CHA2DS2-VASc Score of 4.  Continue Apixaban  (Eliquis ). No bleeding issues.   HTN Elevated today, better controlled at home. No changes.  CAD CAC score 2280 No anginal symptoms Followed by Dr Veryl Gottron   Bradycardia/junctional rhythm Patient asymptomatic but worry about Multaq  contributing.  Stop Multaq  as above.  Recheck ECG at visit on 6/20.   Follow up follow up in the AF clinic in 6 months, sooner if further rhythm control is needed.    Myrtha Ates PA-C [facility] [address] [place], [place] [address]  [phone]  

## 2023-07-19 DIAGNOSIS — R339 Retention of urine, unspecified: Secondary | ICD-10-CM | POA: Diagnosis not present

## 2023-07-19 DIAGNOSIS — Z466 Encounter for fitting and adjustment of urinary device: Secondary | ICD-10-CM | POA: Diagnosis not present

## 2023-07-21 ENCOUNTER — Encounter (HOSPITAL_BASED_OUTPATIENT_CLINIC_OR_DEPARTMENT_OTHER): Payer: Self-pay | Admitting: Family

## 2023-07-21 ENCOUNTER — Ambulatory Visit (HOSPITAL_BASED_OUTPATIENT_CLINIC_OR_DEPARTMENT_OTHER): Admitting: Family

## 2023-07-21 VITALS — BP 142/70 | HR 66 | Ht 70.0 in | Wt 193.0 lb

## 2023-07-21 DIAGNOSIS — I480 Paroxysmal atrial fibrillation: Secondary | ICD-10-CM | POA: Diagnosis not present

## 2023-07-21 DIAGNOSIS — N183 Chronic kidney disease, stage 3 unspecified: Secondary | ICD-10-CM | POA: Diagnosis not present

## 2023-07-21 DIAGNOSIS — E1169 Type 2 diabetes mellitus with other specified complication: Secondary | ICD-10-CM | POA: Diagnosis not present

## 2023-07-21 DIAGNOSIS — I2510 Atherosclerotic heart disease of native coronary artery without angina pectoris: Secondary | ICD-10-CM | POA: Diagnosis not present

## 2023-07-21 DIAGNOSIS — I25118 Atherosclerotic heart disease of native coronary artery with other forms of angina pectoris: Secondary | ICD-10-CM | POA: Diagnosis not present

## 2023-07-21 DIAGNOSIS — I10 Essential (primary) hypertension: Secondary | ICD-10-CM

## 2023-07-21 DIAGNOSIS — H9190 Unspecified hearing loss, unspecified ear: Secondary | ICD-10-CM | POA: Diagnosis not present

## 2023-07-21 DIAGNOSIS — K219 Gastro-esophageal reflux disease without esophagitis: Secondary | ICD-10-CM | POA: Diagnosis not present

## 2023-07-21 DIAGNOSIS — C61 Malignant neoplasm of prostate: Secondary | ICD-10-CM | POA: Diagnosis not present

## 2023-07-21 DIAGNOSIS — E039 Hypothyroidism, unspecified: Secondary | ICD-10-CM | POA: Diagnosis not present

## 2023-07-21 DIAGNOSIS — E785 Hyperlipidemia, unspecified: Secondary | ICD-10-CM

## 2023-07-21 DIAGNOSIS — I4891 Unspecified atrial fibrillation: Secondary | ICD-10-CM | POA: Diagnosis not present

## 2023-07-21 DIAGNOSIS — I129 Hypertensive chronic kidney disease with stage 1 through stage 4 chronic kidney disease, or unspecified chronic kidney disease: Secondary | ICD-10-CM | POA: Diagnosis not present

## 2023-07-21 DIAGNOSIS — Z9359 Other cystostomy status: Secondary | ICD-10-CM | POA: Diagnosis not present

## 2023-07-21 DIAGNOSIS — G7000 Myasthenia gravis without (acute) exacerbation: Secondary | ICD-10-CM | POA: Diagnosis not present

## 2023-07-21 DIAGNOSIS — N1831 Chronic kidney disease, stage 3a: Secondary | ICD-10-CM | POA: Diagnosis not present

## 2023-07-21 MED ORDER — HYDRALAZINE HCL 50 MG PO TABS: 50.0000 mg | ORAL_TABLET | Freq: Two times a day (BID) | ORAL | 1 refills | Status: DC

## 2023-07-21 NOTE — Progress Notes (Signed)
 Cardiology Office Note   Date:  07/21/2023  ID:  Eugene, Castaneda 21-Mar-1945, MRN [id number] PCP: Lonzie Robins, MD  [facility] Providers Cardiologist:  Sheryle Donning, MD Electrophysiologist:  Efraim Grange, MD     History of Present Illness Eugene Castaneda is a 78 y.o. male  with hx of paroxysmal atrial fibrillation s/p ablation , nonobstructive coronary artery disease, bradycardia,, hypothyroidism, hyperlipidemia   A visit with A-fib clinic 04/2022 Flecainide  was discontinued due to episodes of palpitations as well as presence of nonobstructive CAD and he was transitioned to Multaq .    At visit 01/13/23 noted BP not at goal <130/80. He was encouraged to consider Hydralazine 25mg  BID vs increasing dose of hydrochlorothiazide. Since that time, PCP has made both changes.   Seen by AFib clinic 07/07/23. Multaq  was discontinued due to bradycardia and junctional rhythm.Eugene Castaneda He preferred watchful waiting off AAD rather than TIkosyn.   Presents today for follow up with his wife. He notes more palpitations since stopping the Multaq  with [facility] strips with PAF as well as PVC/PAC. Has been monitoring BP at home and reports most oftem 130-140/60-70s though does get up to 150s. Does not feel much of a change on Hydralazine 25mg  BID which was started at the start of the year, discussed this is a low dose. He continues to take hydrochlorothiazide 25mg  daily, Amlodipine 10mg  daily. ARB previously deferred due to hx of hyperkalemia. Reports no shortness of breath nor dyspnea on exertion. Reports no chest pain, pressure, or tightness. No edema, orthopnea, PND.   ROS: Please see the history of present illness.    All other systems reviewed and are negative.   Studies Reviewed EKG Interpretation Date/Time:  Friday July 21 2023 15:00:50 EDT Ventricular Rate:  67 PR Interval:  270 QRS Duration:  88 QT Interval:  418 QTC Calculation: 441 R Axis:   -43  Text  Interpretation: Sinus rhythm with 1st degree A-V block with Premature atrial complexes Left axis deviation  No acute ST/T wave changes. Confirmed by Neomi Banks ([id number]) on 07/21/2023 3:25:06 PM    Cardiac Studies & Procedures   ______________________________________________________________________________________________   STRESS TESTS  EXERCISE TOLERANCE TEST (ETT) 12/17/2019  Narrative  Blood pressure demonstrated a normal response to exercise.  There was no ST segment deviation noted during stress.  ETT with fair exercise tolerance (6:43); no chest pain; normal blood pressure response; no diagnostic ST changes; negative adequate exercise tolerance test.   ECHOCARDIOGRAM  ECHOCARDIOGRAM COMPLETE 07/03/2019  Narrative ECHOCARDIOGRAM REPORT    Patient Name:   Eugene Castaneda Date of Exam: 07/03/2019 Medical Rec #:  [id number]         Height:       70.5 in Accession #:    [id number]        Weight:       200.2 lb Date of Birth:  August 16, 1945         BSA:          2.099 m Patient Age:    73 years          BP:           134/108 mmHg Patient Gender: M                 HR:           66 bpm. Exam Location:  [address]  Procedure: 2D Echo, Cardiac Doppler and Color Doppler  Indications:    I48.0 Atrial fibrillation  History:        Patient has prior history of Echocardiogram examinations, most recent 03/04/2003. CAD, CKD; Risk Factors:Hypertension and HLD.  Sonographer:    Juventino Oppenheim RCS Referring Phys: [phone] JAMES ALLRED  IMPRESSIONS   1. Left ventricular ejection fraction, by estimation, is 60 to 65%. The left ventricle has normal function. The left ventricle has no regional wall motion abnormalities. Left ventricular diastolic parameters are consistent with Grade I diastolic dysfunction (impaired relaxation). The average left ventricular global longitudinal strain is -19.6 %. The global longitudinal strain is normal. 2. Right ventricular systolic function is normal. The  right ventricular size is mildly enlarged. There is normal pulmonary artery systolic pressure. The estimated right ventricular systolic pressure is 22.9 mmHg. 3. The mitral valve is normal in structure. Trivial mitral valve regurgitation. No evidence of mitral stenosis. 4. The aortic valve is tricuspid. Aortic valve regurgitation is trivial. Mild aortic valve sclerosis is present, with no evidence of aortic valve stenosis. Aortic regurgitation PHT measures 1615 msec. 5. Aortic dilatation noted. There is mild dilatation of the ascending aorta measuring 38 mm. 6. The inferior vena cava is normal in size with greater than 50% respiratory variability, suggesting right atrial pressure of 3 mmHg.  FINDINGS Left Ventricle: Left ventricular ejection fraction, by estimation, is 60 to 65%. The left ventricle has normal function. The left ventricle has no regional wall motion abnormalities. The average left ventricular global longitudinal strain is -19.6 %. The global longitudinal strain is normal. The left ventricular internal cavity size was normal in size. There is no left ventricular hypertrophy. Left ventricular diastolic parameters are consistent with Grade I diastolic dysfunction (impaired relaxation). Normal left ventricular filling pressure.  Right Ventricle: The right ventricular size is mildly enlarged. No increase in right ventricular wall thickness. Right ventricular systolic function is normal. There is normal pulmonary artery systolic pressure. The tricuspid regurgitant velocity is 2.23 m/s, and with an assumed right atrial pressure of 3 mmHg, the estimated right ventricular systolic pressure is 22.9 mmHg.  Left Atrium: Left atrial size was normal in size.  Right Atrium: Right atrial size was normal in size.  Pericardium: There is no evidence of pericardial effusion.  Mitral Valve: The mitral valve is normal in structure. Normal mobility of the mitral valve leaflets. Mild mitral annular  calcification. Trivial mitral valve regurgitation. No evidence of mitral valve stenosis.  Tricuspid Valve: The tricuspid valve is normal in structure. Tricuspid valve regurgitation is trivial. No evidence of tricuspid stenosis.  Aortic Valve: The aortic valve is tricuspid. Aortic valve regurgitation is trivial. Aortic regurgitation PHT measures 1615 msec. Mild aortic valve sclerosis is present, with no evidence of aortic valve stenosis.  Pulmonic Valve: The pulmonic valve was normal in structure. Pulmonic valve regurgitation is trivial. No evidence of pulmonic stenosis.  Aorta: Aortic dilatation noted. There is mild dilatation of the ascending aorta measuring 38 mm.  Venous: The inferior vena cava is normal in size with greater than 50% respiratory variability, suggesting right atrial pressure of 3 mmHg.  IAS/Shunts: The interatrial septum appears to be lipomatous. No atrial level shunt detected by color flow Doppler.   LEFT VENTRICLE PLAX 2D LVIDd:         4.53 cm  Diastology LVIDs:         2.89 cm  LV e' lateral:   9.25 cm/s LV PW:         1.25 cm  LV E/e' lateral: 6.9 LV IVS:  1.01 cm  LV e' medial:    7.83 cm/s LVOT diam:     2.10 cm  LV E/e' medial:  8.1 LV SV:         88 LV SV Index:   42       2D Longitudinal Strain LVOT Area:     3.46 cm 2D Strain GLS (A2C):   -19.9 % 2D Strain GLS (A3C):   -21.0 % 2D Strain GLS (A4C):   -17.9 % 2D Strain GLS Avg:     -19.6 %  RIGHT VENTRICLE RV Basal diam:  4.33 cm RV S prime:     12.50 cm/s TAPSE (M-mode): 2.2 cm RVSP:           22.9 mmHg  LEFT ATRIUM             Index       RIGHT ATRIUM           Index LA diam:        4.20 cm 2.00 cm/m  RA Pressure: 3.00 mmHg LA Vol (A2C):   58.1 ml 27.68 ml/m RA Area:     20.50 cm LA Vol (A4C):   62.3 ml 29.68 ml/m RA Volume:   61.80 ml  29.44 ml/m LA Biplane Vol: 60.8 ml 28.96 ml/m AORTIC VALVE LVOT Vmax:   99.00 cm/s LVOT Vmean:  70.200 cm/s LVOT VTI:    0.254 m AI PHT:      1615  msec  AORTA Ao Root diam: 3.60 cm  MITRAL VALVE               TRICUSPID VALVE MV Area (PHT):             TR Peak grad:   19.9 mmHg MV Decel Time:             TR Vmax:        223.00 cm/s MV E velocity: 63.50 cm/s  Estimated RAP:  3.00 mmHg MV A velocity: 67.40 cm/s  RVSP:           22.9 mmHg MV E/A ratio:  0.94 SHUNTS Systemic VTI:  0.25 m Systemic Diam: 2.10 cm  Gaylyn Keas MD Electronically signed by Gaylyn Keas MD Signature Date/Time: 07/03/2019/10:36:19 AM    Final          ______________________________________________________________________________________________      Risk Assessment/Calculations  CHA2DS2-VASc Score = 4   This indicates a 4.8% annual risk of stroke. The patient's score is based upon: CHF History: 0 HTN History: 1 Diabetes History: 0 Stroke History: 0 Vascular Disease History: 1 Age Score: 2 Gender Score: 0    HYPERTENSION CONTROL Vitals:   07/21/23 1453 07/21/23 1500  BP: (!) 142/70 (!) 142/70    The patient's blood pressure is elevated above target today.  In order to address the patient's elevated BP:           Physical Exam VS:  BP (!) 142/70   Pulse 66   Ht 5' 10 (1.778 m)   Wt 193 lb (87.5 kg)   SpO2 97%   BMI 27.69 kg/m   Vitals:   07/21/23 1453 07/21/23 1500  BP: (!) 142/70 (!) 142/70  Pulse: 66   Height: 5' 10 (1.778 m)   Weight: 193 lb (87.5 kg)   SpO2: 97%   BMI (Calculated): 27.69           Wt Readings from Last 3 Encounters:  07/21/23 193 lb (87.5 kg)  07/07/23 193 lb  6.4 oz (87.7 kg)  04/10/23 205 lb (93 kg)    GEN: Well nourished, well developed in no acute distress NECK: No JVD; No carotid bruits CARDIAC: irregular, no murmurs, rubs, gallops RESPIRATORY:  Clear to auscultation without rales, wheezing or rhonchi  ABDOMEN: Soft, non-tender, non-distended EXTREMITIES:  No edema; No deformity   ASSESSMENT AND PLAN PAF/hypercoagulable state - EKG today NSR with PAC. Increased arrhythmias (PAC,  PVC, PAF) since stopping Multaq  due to junctional rhythm and bradycardia. Has follow up with AFib Clinic Monday to discuss next steps such as dofetilide.  CHA2DS2-VASc Score = 4 [CHF History: 0, HTN History: 1, Diabetes History: 0, Stroke History: 0, Vascular Disease History: 1, Age Score: 2, Gender Score: 0].  Therefore, the patient's annual risk of stroke is 4.8 %.    Continue Eliquis  5mg  BID. Does not meet dose reduction criteria.    HTN - BP not at goal <130/80. Continue amlodipine 10mg  daily, hydrochlorothiazide 25mg  daily. Increase hydralazine to 50mg  BID. Discussed to monitor BP at home at least 2 hours after medications and sitting for 5-10 minutes. Avoid ACE/ARB/MRA due to history of hyperkalemia.   Nonocclusive coronary artery disease/HLD, LDL goal less than 70- Stable with no anginal symptoms. No indication for ischemic evaluation.  GDMT Rosuvastatin 10mg  daily. No BB due to bradycardia. Recommend aiming for 150 minutes of moderate intensity activity per week and following a heart healthy diet.         Dispo: follow up in 6 months with Dr. Veryl Gottron  Signed, Clearnce Curia, NP

## 2023-07-21 NOTE — Patient Instructions (Addendum)
 Medication Instructions:    CHANGE Hydralazine 50mg  twice per day  *If you need a refill on your cardiac medications before your next appointment, please call your pharmacy*  Follow-Up: Your next appointment:   As scheduled with AFib Clinic  AND In 6 month(s) with Sheryle Donning, MD, Slater Duncan, NP, or Neomi Banks, NP

## 2023-07-24 ENCOUNTER — Ambulatory Visit (HOSPITAL_COMMUNITY)
Admission: RE | Admit: 2023-07-24 | Discharge: 2023-07-24 | Disposition: A | Discharge status: Home / Self Care | Source: Ambulatory Visit | Attending: Physician Assistant | Admitting: Physician Assistant

## 2023-07-24 VITALS — BP 152/70 | HR 74 | Ht 70.0 in | Wt 195.0 lb

## 2023-07-24 DIAGNOSIS — D6869 Other thrombophilia: Secondary | ICD-10-CM | POA: Diagnosis not present

## 2023-07-24 DIAGNOSIS — I480 Paroxysmal atrial fibrillation: Secondary | ICD-10-CM | POA: Insufficient documentation

## 2023-07-24 DIAGNOSIS — I4892 Unspecified atrial flutter: Secondary | ICD-10-CM | POA: Diagnosis not present

## 2023-07-24 DIAGNOSIS — I4891 Unspecified atrial fibrillation: Secondary | ICD-10-CM

## 2023-07-24 DIAGNOSIS — Z79899 Other long term (current) drug therapy: Secondary | ICD-10-CM | POA: Insufficient documentation

## 2023-07-24 DIAGNOSIS — E785 Hyperlipidemia, unspecified: Secondary | ICD-10-CM | POA: Diagnosis not present

## 2023-07-24 DIAGNOSIS — N183 Chronic kidney disease, stage 3 unspecified: Secondary | ICD-10-CM | POA: Insufficient documentation

## 2023-07-24 DIAGNOSIS — I129 Hypertensive chronic kidney disease with stage 1 through stage 4 chronic kidney disease, or unspecified chronic kidney disease: Secondary | ICD-10-CM | POA: Insufficient documentation

## 2023-07-24 DIAGNOSIS — R001 Bradycardia, unspecified: Secondary | ICD-10-CM | POA: Diagnosis not present

## 2023-07-24 DIAGNOSIS — Z7901 Long term (current) use of anticoagulants: Secondary | ICD-10-CM | POA: Diagnosis not present

## 2023-07-24 DIAGNOSIS — I498 Other specified cardiac arrhythmias: Secondary | ICD-10-CM | POA: Diagnosis not present

## 2023-07-24 DIAGNOSIS — I2510 Atherosclerotic heart disease of native coronary artery without angina pectoris: Secondary | ICD-10-CM | POA: Diagnosis not present

## 2023-07-24 NOTE — Patient Instructions (Addendum)
 Stop hydrochlorothiazide 08/17/23

## 2023-07-24 NOTE — Progress Notes (Signed)
 Primary Care Physician: [name], [name], MD Referring Physician: Dr. Kelsie  Primary Cardiologist: Dr Lonni  Primary EP: Dr Nancey   Eugene Castaneda is a 78 y.o. male with a h/o paroxysmal afib, CAD, CKD, HLD, HTN, hypothyroidism who presents for follow up in the [facility]. Initial afib ablation several years ago with Dr. Epifanio in [place]. He had been maintained on flecainide  without any daily AV nodal agents but this was discontinued due to CAD diagnosis. Transitioned to Multaq  which was discontinued 07/07/23 due to bradycardia/junctional rhythm.    Patient returns for follow up for atrial fibrillation. Since stopping Multaq , he has been having much more frequent palpitations. Review of his Kardia mobile strips today shows SR with PACs and also true paroxysms of afib. No bleeding issues on anticoagulation.   Today, he  denies symptoms of chest pain, shortness of breath, orthopnea, PND, lower extremity edema, dizziness, presyncope, syncope, snoring, daytime somnolence, bleeding, or neurologic sequela. The patient is tolerating medications without difficulties and is otherwise without complaint today.    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 Epifanio 2010 [facility] CHADSVASC 3   Prostate cancer [facility]) 2009   Urethral  obstruction    Urinary retention    Suprapubic Catheter placed 04/25/15    Current Outpatient Medications  Medication Sig Dispense Refill   amLODipine (NORVASC) 10 MG tablet 1 tablet Orally Once a day for 30 days     apixaban  (ELIQUIS ) 5 MG TABS tablet Take 1 tablet by mouth twice daily 180 tablet 1   azaTHIOprine  (IMURAN ) 50 MG tablet Take 0.5 tablets (25 mg total) by mouth in the morning AND 1 tablet (50 mg total) every evening. 180 tablet 3   cholecalciferol (VITAMIN D3) 25 MCG (1000 UNIT) tablet Take 1,000 Units by mouth daily. 1 Tablet Daily     clotrimazole-betamethasone (LOTRISONE) cream Apply 1 application  topically 2 (two) times daily as needed (irritation).     CRANBERRY PO Take 1 tablet by mouth daily. 650 mg daily     Cyanocobalamin 5000 MCG CAPS Take 1 capsule by mouth daily.     DENTA 5000 PLUS 1.1 % CREA dental cream Take 1 Application by mouth at bedtime.     ferrous sulfate 325 (65 FE) MG EC tablet Take 325 mg by mouth 2 (two) times daily.     Fish Oil OIL Take 1,000 mg by mouth daily.      gluconic acid-citric acid (RENACIDIN) irrigation Irrigate with 30 mLs as directed every other day. (Patient taking differently: Irrigate with 30 mLs as directed as needed.)     hydrALAZINE  (APRESOLINE ) 50 MG tablet Take 1 tablet (50 mg total) by mouth 2 (two) times daily. 180 tablet 1  hydrochlorothiazide (HYDRODIURIL) 25 MG tablet Take 25 mg by mouth daily.     Incontinence Supplies (CATHETER EXTENSION TUBING) MISC Inject 1 fluid ounce into the vein as needed. Use as directed     JARDIANCE 25 MG TABS tablet 1 tablet Orally Once a day for 90 days     Leuprolide  Acetate (LUPRON  IJ) Inject as directed as needed (Prostate cancer).      levothyroxine (SYNTHROID) 200 MCG tablet Take 200 mcg by mouth daily before breakfast.     levothyroxine (SYNTHROID) 25 MCG tablet Take 25 mcg by mouth every morning.     levothyroxine (SYNTHROID) 25 MCG tablet Take 25 mcg by mouth daily before breakfast.      mirabegron ER (MYRBETRIQ) 50 MG TB24 tablet Take 50 mg by mouth as needed.     Multiple Vitamin (MULTIVITAMIN) tablet Take 1 tablet by mouth daily.     omeprazole (PRILOSEC) 20 MG capsule Take 20 mg by mouth daily.      rosuvastatin (CRESTOR) 10 MG tablet Take 10 mg by mouth daily.     triamcinolone  cream (KENALOG ) 0.1 % Apply 1 Application topically daily as needed.     vitamin C (ASCORBIC ACID) 500 MG tablet Take 500 mg by mouth daily.     Water  For Irrigation, Sterile (STERILE WATER  FOR IRRIGATION) Irrigate with 1,000,000 mLs as directed as needed. Use as directed     Water  For Irrigation, Sterile (STERILE WATER ) Use to flush catherter as directed 500 mL 11   No current facility-administered medications for this encounter.    ROS- All systems are reviewed and negative except as per the HPI above  Physical Exam: Vitals:   07/24/23 1434  BP: (!) 152/70  Pulse: 74  Weight: 88.5 kg  Height: 5' 10 (1.778 m)     Wt Readings from Last 3 Encounters:  07/24/23 88.5 kg  07/21/23 87.5 kg  07/07/23 87.7 kg    GEN: Well nourished, well developed in no acute distress CARDIAC: Regular rate and rhythm, no murmurs, rubs, gallops RESPIRATORY:  Clear to auscultation without rales, wheezing or rhonchi  ABDOMEN: Soft, non-tender, non-distended EXTREMITIES:  No edema; No deformity    EKG today demonstrates SR, 1st degree AV block Vent. rate 74 BPM PR interval 280 ms QRS duration 92 ms QT/QTcB 402/446 ms   CHA2DS2-VASc Score = 4  The patient's score is based upon: CHF History: 0 HTN History: 1 Diabetes History: 0 Stroke History: 0 Vascular Disease History: 1 Age Score: 2 Gender Score: 0       ASSESSMENT AND PLAN: Paroxysmal Atrial Fibrillation/atrial flutter The patient's CHA2DS2-VASc score is 4, indicating a 4.8% annual risk of stroke.   S/p remote afib ablation by Dr Epifanio, repeat ablations with Dr Kelsie 07/2019 and 02/2020.  Flecainide  discontinued due to CAD.  Multaq  discontinued due to junctional bradycardia.  He has been having more frequent afib episodes. We discussed rhythm control options including dofetilide and amiodarone.  Patient would like to pursue dofetilide admission PharmD to screen medications for QT prolonging agents. Patient will stop hydrochlorothiazide 3 days prior to admission. QTc in SR 446 ms Patient had cmet with PCP on 6/20, will look for results. Check magnesium today. Continue Eliquis  5 mg BID Kardia mobile for home monitoring  Secondary Hypercoagulable State (ICD10:  [phone]) The patient is at significant risk for stroke/thromboembolism based upon his CHA2DS2-VASc Score of 4.  Continue Apixaban  (Eliquis ).   HTN Stable on current regime. Stop hydrochlorothiazide 3 days prior to admission as  above. Will reevaluate BP control at that time.   CAD CAC score 2280 No anginal symptoms Followed by Dr Lonni  Bradycardia/junctional rhythm Improved off Multaq . Limits medication options.    Follow up in the AF clinic for dofetilide loading.    Daril Kicks PA-C Afib Clinic [facility] [address] [place], [place] [address] [phone]

## 2023-07-25 ENCOUNTER — Ambulatory Visit (HOSPITAL_COMMUNITY): Payer: Self-pay | Admitting: Physician Assistant

## 2023-07-25 LAB — MAGNESIUM: Magnesium: 1.9 mg/dL (ref 1.6–2.3)

## 2023-07-26 ENCOUNTER — Telehealth (HOSPITAL_COMMUNITY): Payer: Self-pay | Admitting: Pharmacy Technician

## 2023-07-26 ENCOUNTER — Telehealth: Payer: Self-pay | Admitting: Pharmacist

## 2023-07-26 ENCOUNTER — Other Ambulatory Visit (HOSPITAL_COMMUNITY): Payer: Self-pay

## 2023-07-26 NOTE — Telephone Encounter (Signed)
 Patient Product/process development scientist completed.    The patient is insured through [facility]. Patient has Medicare and is not eligible for a copay card, but may be able to apply for patient assistance or Medicare RX Payment Plan (Patient Must reach out to their plan, if eligible for payment plan), if available.    Ran test claim for dofetilide (Tikosyn) 500 mcg and the current 30 day co-pay is $0.00.   This test claim was processed through [facility]- copay amounts may vary at other pharmacies due to pharmacy/plan contracts, or as the patient moves through the different stages of their insurance plan.     Morgan Arab, CPHT Pharmacy Technician III Certified Patient Advocate [facility] Patient Advocate Team Direct Number: [phone]  Fax: [phone]

## 2023-07-26 NOTE — Telephone Encounter (Signed)
 Medication list reviewed in anticipation of upcoming Tikosyn initiation. Patient is taking one contraindicated or QTc prolonging medications.   Hydrochlorothiazide- is contraindicated with Tikosyn use. Patient will need to stop hydrochlorothiazide at least 3 days prior to admission  Lupron -indeterminate QTc prolonging risk. Monitor QTc closely. No change in therapy needed.  Patient is anticoagulated on Eliquis  on the appropriate dose. Please ensure that patient has not missed any anticoagulation doses in the 3 weeks prior to Tikosyn initiation.   Patient will need to be counseled to avoid use of Benadryl while on Tikosyn and in the 2-3 days prior to Tikosyn initiation.

## 2023-07-27 NOTE — Telephone Encounter (Signed)
Patient notified to stop hydrochlorothiazide 3 days prior to admission.

## 2023-07-28 ENCOUNTER — Telehealth (HOSPITAL_COMMUNITY): Payer: Self-pay

## 2023-07-28 NOTE — Telephone Encounter (Signed)
 Initiated prior authorization to [facility] for Tikosyn admission. Date of service: 08/21/2023 No pre certification required for in patient stay.

## 2023-08-03 DIAGNOSIS — C61 Malignant neoplasm of prostate: Secondary | ICD-10-CM | POA: Diagnosis not present

## 2023-08-10 DIAGNOSIS — Z9359 Other cystostomy status: Secondary | ICD-10-CM | POA: Diagnosis not present

## 2023-08-10 DIAGNOSIS — N529 Male erectile dysfunction, unspecified: Secondary | ICD-10-CM | POA: Diagnosis not present

## 2023-08-10 DIAGNOSIS — C61 Malignant neoplasm of prostate: Secondary | ICD-10-CM | POA: Diagnosis not present

## 2023-08-16 DIAGNOSIS — Z466 Encounter for fitting and adjustment of urinary device: Secondary | ICD-10-CM | POA: Diagnosis not present

## 2023-08-18 ENCOUNTER — Encounter (HOSPITAL_COMMUNITY): Payer: Self-pay

## 2023-08-21 ENCOUNTER — Inpatient Hospital Stay (HOSPITAL_COMMUNITY)
Admission: AD | Admit: 2023-08-21 | Discharge: 2023-08-24 | DRG: 309 | Disposition: A | Discharge status: Home / Self Care | Source: Ambulatory Visit | Attending: Cardiovascular Disease | Admitting: Cardiovascular Disease

## 2023-08-21 ENCOUNTER — Encounter (HOSPITAL_COMMUNITY): Payer: Self-pay | Admitting: Cardiovascular Disease

## 2023-08-21 ENCOUNTER — Ambulatory Visit (HOSPITAL_COMMUNITY)
Admission: RE | Admit: 2023-08-21 | Discharge: 2023-08-21 | Disposition: A | Discharge status: Home / Self Care | Source: Ambulatory Visit | Attending: Physician Assistant | Admitting: Physician Assistant

## 2023-08-21 ENCOUNTER — Other Ambulatory Visit: Payer: Self-pay

## 2023-08-21 VITALS — BP 138/64 | HR 65 | Ht 70.0 in | Wt 195.4 lb

## 2023-08-21 DIAGNOSIS — R7301 Impaired fasting glucose: Secondary | ICD-10-CM | POA: Diagnosis present

## 2023-08-21 DIAGNOSIS — I480 Paroxysmal atrial fibrillation: Secondary | ICD-10-CM | POA: Insufficient documentation

## 2023-08-21 DIAGNOSIS — I10 Essential (primary) hypertension: Secondary | ICD-10-CM | POA: Diagnosis present

## 2023-08-21 DIAGNOSIS — Z8546 Personal history of malignant neoplasm of prostate: Secondary | ICD-10-CM

## 2023-08-21 DIAGNOSIS — Z79899 Other long term (current) drug therapy: Secondary | ICD-10-CM | POA: Insufficient documentation

## 2023-08-21 DIAGNOSIS — K219 Gastro-esophageal reflux disease without esophagitis: Secondary | ICD-10-CM | POA: Diagnosis present

## 2023-08-21 DIAGNOSIS — I129 Hypertensive chronic kidney disease with stage 1 through stage 4 chronic kidney disease, or unspecified chronic kidney disease: Secondary | ICD-10-CM | POA: Insufficient documentation

## 2023-08-21 DIAGNOSIS — Z7901 Long term (current) use of anticoagulants: Secondary | ICD-10-CM

## 2023-08-21 DIAGNOSIS — I4819 Other persistent atrial fibrillation: Principal | ICD-10-CM | POA: Diagnosis present

## 2023-08-21 DIAGNOSIS — G7000 Myasthenia gravis without (acute) exacerbation: Secondary | ICD-10-CM | POA: Diagnosis present

## 2023-08-21 DIAGNOSIS — E785 Hyperlipidemia, unspecified: Secondary | ICD-10-CM | POA: Insufficient documentation

## 2023-08-21 DIAGNOSIS — D6869 Other thrombophilia: Secondary | ICD-10-CM | POA: Diagnosis present

## 2023-08-21 DIAGNOSIS — I4891 Unspecified atrial fibrillation: Secondary | ICD-10-CM | POA: Insufficient documentation

## 2023-08-21 DIAGNOSIS — N183 Chronic kidney disease, stage 3 unspecified: Secondary | ICD-10-CM | POA: Insufficient documentation

## 2023-08-21 DIAGNOSIS — R001 Bradycardia, unspecified: Secondary | ICD-10-CM | POA: Diagnosis present

## 2023-08-21 DIAGNOSIS — Z7984 Long term (current) use of oral hypoglycemic drugs: Secondary | ICD-10-CM | POA: Diagnosis not present

## 2023-08-21 DIAGNOSIS — Z79890 Hormone replacement therapy (postmenopausal): Secondary | ICD-10-CM

## 2023-08-21 DIAGNOSIS — I4892 Unspecified atrial flutter: Secondary | ICD-10-CM | POA: Insufficient documentation

## 2023-08-21 DIAGNOSIS — I2510 Atherosclerotic heart disease of native coronary artery without angina pectoris: Secondary | ICD-10-CM | POA: Diagnosis present

## 2023-08-21 DIAGNOSIS — E039 Hypothyroidism, unspecified: Secondary | ICD-10-CM | POA: Diagnosis present

## 2023-08-21 LAB — BASIC METABOLIC PANEL WITH GFR
BUN/Creatinine Ratio: 15 (ref 10–24)
BUN: 16 mg/dL (ref 8–27)
CO2: 27 mmol/L (ref 20–29)
Calcium: 9.8 mg/dL (ref 8.6–10.2)
Chloride: 102 mmol/L (ref 96–106)
Creatinine, Ser: 1.09 mg/dL (ref 0.76–1.27)
Glucose: 92 mg/dL (ref 70–99)
Potassium: 4.8 mmol/L (ref 3.5–5.2)
Sodium: 140 mmol/L (ref 134–144)
eGFR: 69 mL/min/1.73 (ref >59)

## 2023-08-21 LAB — MAGNESIUM: Magnesium: 1.8 mg/dL (ref 1.6–2.3)

## 2023-08-21 MED ORDER — ROSUVASTATIN CALCIUM 5 MG PO TABS
10.0000 mg | ORAL_TABLET | Freq: Every day | ORAL | Status: DC
  Administered 2023-08-22 – 2023-08-24 (×3): 10 mg via ORAL
  Filled 2023-08-21 (×3): qty 2

## 2023-08-21 MED ORDER — HYDRALAZINE HCL 50 MG PO TABS
50.0000 mg | ORAL_TABLET | Freq: Two times a day (BID) | ORAL | Status: DC
Start: 2023-08-21 — End: 2023-08-24
  Administered 2023-08-21 – 2023-08-24 (×6): 50 mg via ORAL
  Filled 2023-08-21 (×6): qty 1

## 2023-08-21 MED ORDER — AZATHIOPRINE 50 MG PO TABS
50.0000 mg | ORAL_TABLET | Freq: Every day | ORAL | Status: DC
  Administered 2023-08-21 – 2023-08-23 (×3): 50 mg via ORAL
  Filled 2023-08-21 (×3): qty 1

## 2023-08-21 MED ORDER — ADULT MULTIVITAMIN W/MINERALS CH
1.0000 | ORAL_TABLET | Freq: Every day | ORAL | Status: DC
  Administered 2023-08-22 – 2023-08-24 (×3): 1 via ORAL
  Filled 2023-08-21 (×3): qty 1

## 2023-08-21 MED ORDER — LEVOTHYROXINE SODIUM 50 MCG PO TABS: 25.0000 ug | ORAL_TABLET | Freq: Every day | ORAL | Status: DC

## 2023-08-21 MED ORDER — CRANBERRY 500 MG PO CAPS: ORAL_CAPSULE | Freq: Every day | ORAL | Status: DC

## 2023-08-21 MED ORDER — AZATHIOPRINE 25 MG PO HALF TABLET
25.0000 mg | ORAL_TABLET | Freq: Every day | ORAL | Status: DC
  Administered 2023-08-22 – 2023-08-24 (×3): 25 mg via ORAL
  Filled 2023-08-21 (×3): qty 1

## 2023-08-21 MED ORDER — SODIUM CHLORIDE 0.9% FLUSH: 3.0000 mL | INTRAVENOUS | Status: DC | PRN

## 2023-08-21 MED ORDER — SODIUM CHLORIDE 0.9 % IV SOLN: 250.0000 mL | INTRAVENOUS | Status: AC | PRN

## 2023-08-21 MED ORDER — VITAMIN C 500 MG PO TABS
500.0000 mg | ORAL_TABLET | Freq: Every day | ORAL | Status: DC
  Administered 2023-08-22 – 2023-08-24 (×3): 500 mg via ORAL
  Filled 2023-08-21 (×3): qty 1

## 2023-08-21 MED ORDER — SODIUM CHLORIDE 0.9% FLUSH
3.0000 mL | Freq: Two times a day (BID) | INTRAVENOUS | Status: DC
  Administered 2023-08-21 – 2023-08-24 (×5): 3 mL via INTRAVENOUS

## 2023-08-21 MED ORDER — VITAMIN B-12 1000 MCG PO TABS
1000.0000 ug | ORAL_TABLET | Freq: Every day | ORAL | Status: DC
  Administered 2023-08-22 – 2023-08-24 (×3): 1000 ug via ORAL
  Filled 2023-08-21 (×3): qty 1

## 2023-08-21 MED ORDER — ONE-DAILY MULTI VITAMINS PO TABS: 1.0000 | ORAL_TABLET | Freq: Every day | ORAL | Status: DC

## 2023-08-21 MED ORDER — MAGNESIUM SULFATE 2 GM/50ML IV SOLN
2.0000 g | Freq: Once | INTRAVENOUS | Status: AC
  Administered 2023-08-21: 2 g via INTRAVENOUS
  Filled 2023-08-21: qty 50

## 2023-08-21 MED ORDER — LEVOTHYROXINE SODIUM 75 MCG PO TABS
225.0000 ug | ORAL_TABLET | Freq: Every day | ORAL | Status: DC
  Administered 2023-08-22 – 2023-08-24 (×3): 225 ug via ORAL
  Filled 2023-08-21 (×3): qty 3

## 2023-08-21 MED ORDER — MIRABEGRON ER 50 MG PO TB24: 50.0000 mg | ORAL_TABLET | Freq: Every day | ORAL | Status: DC | PRN

## 2023-08-21 MED ORDER — FERROUS SULFATE 325 (65 FE) MG PO TBEC
325.0000 mg | DELAYED_RELEASE_TABLET | Freq: Two times a day (BID) | ORAL | Status: DC
  Filled 2023-08-21: qty 1

## 2023-08-21 MED ORDER — FERROUS SULFATE 325 (65 FE) MG PO TABS
325.0000 mg | ORAL_TABLET | Freq: Two times a day (BID) | ORAL | Status: DC
  Administered 2023-08-22 – 2023-08-24 (×5): 325 mg via ORAL
  Filled 2023-08-21 (×6): qty 1

## 2023-08-21 MED ORDER — CYANOCOBALAMIN 5000 MCG PO CAPS: 1.0000 | ORAL_CAPSULE | Freq: Every day | ORAL | Status: DC

## 2023-08-21 MED ORDER — AMLODIPINE BESYLATE 10 MG PO TABS
10.0000 mg | ORAL_TABLET | Freq: Every day | ORAL | Status: DC
Start: 2023-08-22 — End: 2023-08-24
  Administered 2023-08-22 – 2023-08-24 (×3): 10 mg via ORAL
  Filled 2023-08-21 (×3): qty 1

## 2023-08-21 MED ORDER — VITAMIN D 25 MCG (1000 UNIT) PO TABS
1000.0000 [IU] | ORAL_TABLET | Freq: Every day | ORAL | Status: DC
  Administered 2023-08-22 – 2023-08-24 (×3): 1000 [IU] via ORAL
  Filled 2023-08-21 (×3): qty 1

## 2023-08-21 MED ORDER — DOFETILIDE 500 MCG PO CAPS
500.0000 ug | ORAL_CAPSULE | Freq: Two times a day (BID) | ORAL | Status: DC
  Administered 2023-08-21 – 2023-08-24 (×6): 500 ug via ORAL
  Filled 2023-08-21 (×6): qty 1

## 2023-08-21 MED ORDER — MIRABEGRON ER 50 MG PO TB24
50.0000 mg | ORAL_TABLET | Freq: Every day | ORAL | Status: DC | PRN
  Filled 2023-08-21: qty 1

## 2023-08-21 MED ORDER — APIXABAN 5 MG PO TABS
5.0000 mg | ORAL_TABLET | Freq: Two times a day (BID) | ORAL | Status: DC
  Administered 2023-08-21 – 2023-08-24 (×6): 5 mg via ORAL
  Filled 2023-08-21 (×6): qty 1

## 2023-08-21 MED ORDER — LEVOTHYROXINE SODIUM 100 MCG PO TABS: 200.0000 ug | ORAL_TABLET | Freq: Every day | ORAL | Status: DC

## 2023-08-21 MED ORDER — RENACIDIN IR SOLN: 30.0000 mL | Status: DC

## 2023-08-21 MED ORDER — PANTOPRAZOLE SODIUM 40 MG PO TBEC
40.0000 mg | DELAYED_RELEASE_TABLET | Freq: Every day | ORAL | Status: DC
  Administered 2023-08-22 – 2023-08-24 (×3): 40 mg via ORAL
  Filled 2023-08-21 (×4): qty 1

## 2023-08-21 NOTE — Progress Notes (Signed)
 Primary Care Physician: [name], [name], MD Referring Physician: Dr. Kelsie  Primary Cardiologist: Dr Lonni  Primary EP: Dr Nancey   Eugene Castaneda is a 78 y.o. male with a h/o paroxysmal afib, CAD, CKD, HLD, HTN, hypothyroidism who presents for follow up in the [facility]. Initial afib ablation several years ago with Dr. Epifanio in [place]. He had been maintained on flecainide  without any daily AV nodal agents but this was discontinued due to CAD diagnosis. Transitioned to Multaq  which was discontinued 07/07/23 due to bradycardia/junctional rhythm.    Since stopping Multaq , he has been having much more frequent palpitations. Review of his Kardia mobile strips showed SR with PACs and also true paroxysms of afib.   Patient returns for follow up for atrial fibrillation and dofetilide  admission. He is in SR today. He has discontinued hydrochlorothiazide. He did have some blood in his urine after his catheter was changed but this is not unusual for him and has resolved.   Today, he  denies symptoms of palpitations, chest pain, shortness of breath, orthopnea, PND, lower extremity edema, dizziness, presyncope, syncope, snoring, daytime somnolence, bleeding, or neurologic sequela. The patient is tolerating medications without difficulties and is otherwise without complaint today.    Past Medical History:  Diagnosis Date   Anemia    on meds   CAD (coronary artery disease), native coronary artery 11/13/2017   Coronary calcification noted on CT scan    Chronic kidney disease (CKD), stage III (moderate) (HCC) 11/13/2017   has suprapubic catheter   Diplopia    Foley catheter present    pt has supra-pubic foley catheter in place/since 2016   GERD (gastroesophageal reflux disease)    on meds   Hyperlipemia    on meds   Hypertension    on meds   Hypertensive heart disease without CHF 11/13/2017   Hypothyroidism    on meds   Impaired fasting glucose    MG  (myasthenia gravis) (HCC)    Paroxysmal atrial fibrillation (HCC) 11/13/2017   RF ablation Dr. Epifanio 2010 [facility] CHADSVASC 3   Prostate cancer [facility]) 2009   Urethral obstruction    Urinary retention    Suprapubic Catheter placed 04/25/15    Current Outpatient Medications  Medication Sig Dispense Refill   amLODipine  (NORVASC ) 10 MG tablet 1 tablet Orally Once a day for 30 days     apixaban  (ELIQUIS ) 5 MG TABS tablet Take 1 tablet by mouth twice daily (Patient taking differently: Take 10 mg by mouth every morning.) 180 tablet 1   azaTHIOprine  (IMURAN ) 50 MG tablet Take 0.5 tablets (25 mg total) by mouth in the morning AND 1 tablet (50 mg total) every evening. 180 tablet 3   cholecalciferol  (VITAMIN D3) 25 MCG (1000 UNIT) tablet Take 1,000 Units by mouth daily. 1 Tablet Daily     clotrimazole-betamethasone (LOTRISONE) cream Apply 1 application  topically 2 (two) times daily as needed (irritation). (Patient taking differently: Apply 1 application  topically as needed (irritation).)     CRANBERRY PO Take 1 tablet by mouth daily. 650 mg daily     Cyanocobalamin  5000 MCG CAPS Take 1 capsule by mouth daily.     DENTA 5000 PLUS 1.1 % CREA dental cream Take 1 Application by mouth at bedtime. (Patient taking differently: Take 1 Application by mouth as needed.)     ferrous sulfate  325 (65 FE) MG EC tablet Take 325 mg by mouth 2 (two) times daily.     Fish  Oil OIL Take 1,000 mg by mouth daily.      gluconic acid-citric acid (RENACIDIN ) irrigation Irrigate with 30 mLs as directed every other day.     hydrALAZINE  (APRESOLINE ) 50 MG tablet Take 1 tablet (50 mg total) by mouth 2 (two) times daily. 180 tablet 1   Incontinence Supplies (CATHETER EXTENSION TUBING) MISC Inject 1 fluid ounce into the vein as needed. Use as directed     JARDIANCE 25 MG TABS tablet 1 tablet Orally Once a day for 90 days     Leuprolide  Acetate (LUPRON  IJ) Inject as directed as needed (Prostate cancer).      levothyroxine   (SYNTHROID ) 200 MCG tablet Take 200 mcg by mouth daily before breakfast.     levothyroxine  (SYNTHROID ) 25 MCG tablet Take 25 mcg by mouth every morning.     mirabegron  ER (MYRBETRIQ ) 50 MG TB24 tablet Take 50 mg by mouth as needed.     Multiple Vitamin (MULTIVITAMIN) tablet Take 1 tablet by mouth daily.     omeprazole (PRILOSEC) 20 MG capsule Take 20 mg by mouth daily.      rosuvastatin  (CRESTOR ) 10 MG tablet Take 10 mg by mouth daily.     triamcinolone  cream (KENALOG ) 0.1 % Apply 1 Application topically daily as needed. (Patient taking differently: Apply 1 Application topically as needed.)     vitamin C  (ASCORBIC ACID ) 500 MG tablet Take 500 mg by mouth daily.     Water  For Irrigation, Sterile (STERILE WATER ) Use to flush catherter as directed 500 mL 11   Water  For Irrigation, Sterile (STERILE WATER  FOR IRRIGATION) Irrigate with 1,000,000 mLs as directed as needed. Use as directed     No current facility-administered medications for this encounter.    ROS- All systems are reviewed and negative except as per the HPI above  Physical Exam: Vitals:   08/21/23 0942  BP: 138/64  Pulse: 65  Weight: 88.6 kg  Height: 5' 10 (1.778 m)      Wt Readings from Last 3 Encounters:  08/21/23 88.6 kg  07/24/23 88.5 kg  07/21/23 87.5 kg    GEN: Well nourished, well developed in no acute distress CARDIAC: Regular rate and rhythm, no murmurs, rubs, gallops RESPIRATORY:  Clear to auscultation without rales, wheezing or rhonchi  ABDOMEN: Soft, non-tender, non-distended EXTREMITIES:  No edema; No deformity    EKG today demonstrates SR, PACs Vent. rate 65 BPM PR interval 268 ms QRS duration 86 ms QT/QTcB 408/424 ms   CHA2DS2-VASc Score = 4  The patient's score is based upon: CHF History: 0 HTN History: 1 Diabetes History: 0 Stroke History: 0 Vascular Disease History: 1 Age Score: 2 Gender Score: 0       ASSESSMENT AND PLAN: Paroxysmal Atrial Fibrillation/atrial flutter (ICD10:   I48.0) The patient's CHA2DS2-VASc score is 4, indicating a 4.8% annual risk of stroke.   S/p remote afib ablation by Dr Epifanio, repeat ablations with Dr Kelsie 07/2019 and 02/2020. Flecainide  discontinued due to CAD. Multaq  discontinued due to junctional bradycardia.  Patient presents for dofetilide  admission Continue Eliquis  5 mg BID, states no missed doses in the last 3 weeks. No recent benadryl use PharmD has screened medications for QT prolonging agents. Patient has discontinued hydrochlorothiazide. QTc in SR 424 ms Labs today show creatinine at 1.09, K+ 4.8 and mag 1.8, CrCl calculated at 70 mL/min Kardia mobile for home monitoring.   Secondary Hypercoagulable State (ICD10:  D68.69) The patient is at significant risk for stroke/thromboembolism based upon his CHA2DS2-VASc Score of  4.  Continue Apixaban  (Eliquis ). No bleeding issues.   HTN Stable on current regimen Now off hydrochlorothiazide  CAD CAC score 2280 No anginal symptoms  Bradycardia/junctional rhythm Improved off Multaq , limits medication options.   To be admitted later today once a bed becomes available.    Daril Kicks PA-C [facility] [address] [place], [place] [address] [phone]

## 2023-08-21 NOTE — Progress Notes (Signed)
 Pharmacy: Dofetilide  (Tikosyn ) - Initial Consult Assessment and Electrolyte Replacement  Pharmacy consulted to assist in monitoring and replacing electrolytes in this 78 y.o. male admitted on 08/21/2023 undergoing dofetilide  initiation.   Assessment:  Patient Exclusion Criteria: If any screening criteria checked as Yes, then  patient  should NOT receive dofetilide  until criteria item is corrected.  If "Yes" please indicate correction plan.  YES  NO Patient  Exclusion Criteria Correction Plan/Comments   []   [x]   Baseline QTc interval is greater than or equal to 440 msec. IF above YES box checked dofetilide  contraindicated unless patient has ICD; then may proceed if QTc 500-550 msec or with known ventricular conduction abnormalities may proceed with QTc 550-600 msec. QTc = 424    []   [x]   Patient is known or suspected to have a digoxin level greater than 2 ng/ml: No results found for: DIGOXIN     []   [x]   Creatinine clearance less than 20 ml/min (calculated using Cockcroft-Gault, actual body weight and serum creatinine): Estimated Creatinine Clearance: 62.5 mL/min (by C-G formula based on SCr of 1.09 mg/dL).     []   [x]  Patient has received drugs known to prolong the QT intervals within the last 48 hour (examples: phenothiazines, tricyclics or tetracyclic antidepressants, macrolides, 1st generation H-1 antihistamines (especially diphenhydramine), fluoroquinolones, azoles, ondansetron , metoclopramide, promethazine ).   Updated information on QT prolonging agents is available to be searched on the following database:QT prolonging agents -If SSRI or antihistamine needed, preferred options are sertraline and loratadine respectively  -Mirabegron : possible risk of QTc prolongation but not contraindicated   []   [x]  Patient received a dose of a thiazide diuretic in the last 48 hours [including hydrochlorothiazide (Oretic) alone or in any combination including triamterene (Dyazide,  Maxzide)].    []   [x]  Patient received a medication known to increase dofetilide  plasma concentrations prior to initial dofetilide  dose:  Trimethoprim (Primsol, Proloprim) in the last 36 hours Verapamil (Calan, Verelan) in the last 36 hours or a sustained release dose in the last 72 hours Megestrol (Megace) in the last 5 days  Cimetidine (Tagamet) in the last 6 hours Ketoconazole (Nizoral) in the last 24 hours Itraconazole (Sporanox) in the last 48 hours  Prochlorperazine (Compazine) in the last 36 hours     []   [x]   Patient is known to have a history of torsades de pointes; congenital or acquired long QT syndromes.    []   [x]   Patient has received a Class 1 and Class 3 antiarrhythmic with less than 2 half-lives since last dose. (Disopyramide, Quinidine, Procainamide, Lidocaine , Mexiletine, Flecainide , Propafenone, Sotalol, Dronedarone )    []   [x]   Patient has received amiodarone therapy in the past 3 months or amiodarone level is greater than 0.3 ng/ml.    Labs:    Component Value Date/Time   K 4.8 08/21/2023 0944   MG 1.8 08/21/2023 0944     Plan: Select One Calculated CrCl  Dose q12h  [x]  > 60 ml/min 500 mcg  []  40-60 ml/min 250 mcg  []  20-40 ml/min 125 mcg   []   Physician selected initial dose within range recommended for patients level of renal function - will monitor for response.  []   Physician selected initial dose outside of range recommended for patients level of renal function - will discuss if the dose should be altered at this time.   Patient has been appropriately anticoagulated with apixaban .  Potassium: K >/= 4: Appropriate to initiate Tikosyn , no replacement needed    Magnesium : Mg 1.8-2:  Give Mg 2 gm IV x1 to prevent Mg from dropping below 1.8 - do not need to recheck Mg. Appropriate to initiate Tikosyn    Thank you for allowing pharmacy to participate in this patient's care   Prentice Poisson, PharmD Clinical Pharmacist **Pharmacist phone directory  can now be found on [email or web address] ([email or web address]).  Listed under [facility].

## 2023-08-21 NOTE — H&P (Signed)
 Primary Care Physician: [name], [name], MD Referring Physician: Dr. Kelsie  Primary Cardiologist: Dr Lonni  Primary EP: Dr Nancey   Eugene Castaneda is a 78 y.o. male with a h/o paroxysmal afib, CAD, CKD, HLD, HTN, hypothyroidism who presents for follow up in the [facility]. Initial afib ablation several years ago with Dr. Epifanio in [place]. He had been maintained on flecainide  without any daily AV nodal agents but this was discontinued due to CAD diagnosis. Transitioned to Multaq  which was discontinued 07/07/23 due to bradycardia/junctional rhythm.    Since stopping Multaq , he has been having much more frequent palpitations. Review of his Kardia mobile strips showed SR with PACs and also true paroxysms of afib.   Patient returns for follow up for atrial fibrillation and dofetilide  admission. He is in SR today. He has discontinued hydrochlorothiazide. He did have some blood in his urine after his catheter was changed but this is not unusual for him and has resolved.   Today, he  denies symptoms of palpitations, chest pain, shortness of breath, orthopnea, PND, lower extremity edema, dizziness, presyncope, syncope, snoring, daytime somnolence, bleeding, or neurologic sequela. The patient is tolerating medications without difficulties and is otherwise without complaint today.    Past Medical History:  Diagnosis Date   Anemia    on meds   CAD (coronary artery disease), native coronary artery 11/13/2017   Coronary calcification noted on CT scan    Chronic kidney disease (CKD), stage III (moderate) (HCC) 11/13/2017   has suprapubic catheter   Diplopia    Foley catheter present    pt has supra-pubic foley catheter in place/since 2016   GERD (gastroesophageal reflux disease)    on meds   Hyperlipemia    on meds   Hypertension    on meds   Hypertensive heart disease without CHF 11/13/2017   Hypothyroidism    on meds   Impaired fasting glucose    MG  (myasthenia gravis) (HCC)    Paroxysmal atrial fibrillation (HCC) 11/13/2017   RF ablation Dr. Epifanio 2010 [facility] CHADSVASC 3   Prostate cancer [facility]) 2009   Urethral obstruction    Urinary retention    Suprapubic Catheter placed 04/25/15    Current Facility-Administered Medications  Medication Dose Route Frequency Provider Last Rate Last Admin   0.9 %  sodium chloride  infusion  250 mL Intravenous PRN Fenton, Clint R, PA       [START ON 08/22/2023] amLODipine  (NORVASC ) tablet 10 mg  10 mg Oral Daily Fenton, Clint R, PA       apixaban  (ELIQUIS ) tablet 5 mg  5 mg Oral BID Fenton, Clint R, PA       [START ON 08/22/2023] ascorbic acid  (VITAMIN C ) tablet 500 mg  500 mg Oral Daily Fenton, Clint R, PA       [START ON 08/22/2023] azaTHIOprine  (IMURAN ) tablet 25 mg  25 mg Oral Daily Fenton, Clint R, PA       azaTHIOprine  (IMURAN ) tablet 50 mg  50 mg Oral QHS Fenton, Clint R, PA       [START ON 08/22/2023] cholecalciferol  (VITAMIN D3) 25 MCG (1000 UNIT) tablet 1,000 Units  1,000 Units Oral Daily Fenton, Clint R, PA       [START ON 08/22/2023] Cranberry CAPS   Oral Daily Fenton, Clint R, PA       [START ON 08/22/2023] Cyanocobalamin  CAPS 1 capsule  1 capsule Oral Daily Fenton, Clint R, PA  dofetilide  (TIKOSYN ) capsule 500 mcg  500 mcg Oral BID Fenton, Clint R, PA       ferrous sulfate  EC tablet 325 mg  325 mg Oral BID Fenton, Clint R, PA       [START ON 08/22/2023] gluconic acid-citric acid (RENACIDIN ) irrigation 30 mL  30 mL Irrigation QODAY Fenton, Clint R, PA       hydrALAZINE  (APRESOLINE ) tablet 50 mg  50 mg Oral BID Fenton, Clint R, PA       [START ON 08/23/2023] levothyroxine  (SYNTHROID ) tablet 200 mcg  200 mcg Oral QAC breakfast Fenton, Clint R, PA       [START ON 08/23/2023] levothyroxine  (SYNTHROID ) tablet 25 mcg  25 mcg Oral QAC breakfast Fenton, Clint R, PA       mirabegron  ER (MYRBETRIQ ) tablet 50 mg  50 mg Oral Daily PRN Fenton, Clint R, PA       [START ON 08/22/2023] multivitamin  tablet 1 tablet  1 tablet Oral Daily Fenton, Clint R, PA       [START ON 08/22/2023] pantoprazole  (PROTONIX ) EC tablet 40 mg  40 mg Oral Daily Fenton, Clint R, PA       [START ON 08/22/2023] rosuvastatin  (CRESTOR ) tablet 10 mg  10 mg Oral Daily Fenton, Clint R, PA       sodium chloride  flush (NS) 0.9 % injection 3 mL  3 mL Intravenous Q12H Fenton, Clint R, PA       sodium chloride  flush (NS) 0.9 % injection 3 mL  3 mL Intravenous PRN Fenton, Clint R, PA        ROS- All systems are reviewed and negative except as per the HPI above  Physical Exam: Vitals:   08/21/23 1537  BP: (!) 167/68  Pulse: 74  Resp: 18  Temp: 98 F (36.7 C)  TempSrc: Oral  SpO2: 98%  Weight: 88.3 kg  Height: 5' 10 (1.778 m)      Wt Readings from Last 3 Encounters:  08/21/23 88.3 kg  08/21/23 88.6 kg  07/24/23 88.5 kg    GEN: Well nourished, well developed in no acute distress CARDIAC: Regular rate and rhythm, no murmurs, rubs, gallops RESPIRATORY:  Clear to auscultation without rales, wheezing or rhonchi  ABDOMEN: Soft, non-tender, non-distended EXTREMITIES:  No edema; No deformity    EKG today demonstrates SR, PACs Vent. rate 65 BPM PR interval 268 ms QRS duration 86 ms QT/QTcB 408/424 ms   CHA2DS2-VASc Score = 4  The patient's score is based upon: CHF History: 0 HTN History: 1 Diabetes History: 0 Stroke History: 0 Vascular Disease History: 1 Age Score: 2 Gender Score: 0       ASSESSMENT AND PLAN: Paroxysmal Atrial Fibrillation/atrial flutter (ICD10:  I48.0) The patient's CHA2DS2-VASc score is 4, indicating a 4.8% annual risk of stroke.   S/p remote afib ablation by Dr Epifanio, repeat ablations with Dr Kelsie 07/2019 and 02/2020. Flecainide  discontinued due to CAD. Multaq  discontinued due to junctional bradycardia.  Patient presents for dofetilide  admission Continue Eliquis  5 mg BID, states no missed doses in the last 3 weeks. No recent benadryl use PharmD has screened medications  for QT prolonging agents. Patient has discontinued hydrochlorothiazide. QTc in SR 424 ms Labs today show creatinine at 1.09, K+ 4.8 and mag 1.8, CrCl calculated at 70 mL/min Kardia mobile for home monitoring.   Secondary Hypercoagulable State (ICD10:  D68.69) The patient is at significant risk for stroke/thromboembolism based upon his CHA2DS2-VASc Score of 4.  Continue Apixaban  (Eliquis ). No bleeding  issues.   HTN Stable on current regimen Now off hydrochlorothiazide  CAD CAC score 2280 No anginal symptoms  Bradycardia/junctional rhythm Improved off Multaq , limits medication options.   Pt presents today for tikosyn  admission as above.   Ozell Jodie Passey, PA-C  08/21/2023 3:53 PM

## 2023-08-22 ENCOUNTER — Encounter (HOSPITAL_COMMUNITY): Payer: Self-pay | Admitting: Cardiovascular Disease

## 2023-08-22 DIAGNOSIS — R001 Bradycardia, unspecified: Secondary | ICD-10-CM | POA: Diagnosis not present

## 2023-08-22 DIAGNOSIS — I4819 Other persistent atrial fibrillation: Secondary | ICD-10-CM | POA: Diagnosis not present

## 2023-08-22 LAB — BASIC METABOLIC PANEL WITH GFR
Anion gap: 9 (ref 5–15)
BUN: 11 mg/dL (ref 8–23)
CO2: 27 mmol/L (ref 22–32)
Calcium: 9.3 mg/dL (ref 8.9–10.3)
Chloride: 106 mmol/L (ref 98–111)
Creatinine, Ser: 1.08 mg/dL (ref 0.61–1.24)
GFR, Estimated: >60 mL/min (ref >60)
Glucose, Bld: 91 mg/dL (ref 70–99)
Potassium: 4.2 mmol/L (ref 3.5–5.1)
Sodium: 142 mmol/L (ref 135–145)

## 2023-08-22 LAB — MAGNESIUM: Magnesium: 2.1 mg/dL (ref 1.7–2.4)

## 2023-08-22 NOTE — Progress Notes (Signed)
 Morning EKG reviewed     Shows remains in NSR with stable QTc at ~460 ms.  Continue  Tikosyn  500 mcg BID.   Potassium4.2 (07/22 0506) Magnesium   2.1 (07/22 0506) Creatinine, ser  1.08 (07/22 0506)  Plan for home Thursday if QTc remains stable   Ozell Prentice Passey, [place]  08/22/2023 10:46 AM

## 2023-08-22 NOTE — Progress Notes (Signed)
 Electrophysiology Rounding Note  Patient Name: Eugene Castaneda Date of Encounter: 08/22/2023  Primary Cardiologist: Shelda Bruckner, MD  Electrophysiologist: Eulas FORBES Furbish, MD    Subjective   Pt remains in NSR on Tikosyn  500 mcg BID   QTc from EKG last pm shows borderline QTc at ~470 when measured manually  The patient is doing well today.  At this time, the patient denies chest pain, shortness of breath, or any new concerns.  Inpatient Medications    Scheduled Meds:  amLODipine   10 mg Oral Daily   apixaban   5 mg Oral BID   ascorbic acid   500 mg Oral Daily   azaTHIOprine   25 mg Oral Daily   azaTHIOprine   50 mg Oral QHS   cholecalciferol   1,000 Units Oral Daily   vitamin B-12  1,000 mcg Oral Daily   dofetilide   500 mcg Oral BID   ferrous sulfate   325 mg Oral BID WC   hydrALAZINE   50 mg Oral BID   levothyroxine   225 mcg Oral Q0600   multivitamin with minerals  1 tablet Oral Daily   pantoprazole   40 mg Oral Daily   rosuvastatin   10 mg Oral Daily   sodium chloride  flush  3 mL Intravenous Q12H   Continuous Infusions:  sodium chloride      PRN Meds: sodium chloride , mirabegron  ER, sodium chloride  flush   Vital Signs    Vitals:   08/21/23 1537 08/21/23 2039 08/21/23 2314 08/22/23 0500  BP: (!) 167/68 (!) 161/67 (!) 155/68 (!) 151/75  Pulse: 74   62  Resp: 18 17 18 18   Temp: 98 F (36.7 C) 98.3 F (36.8 C) 98 F (36.7 C) 97.6 F (36.4 C)  TempSrc: Oral Oral Oral Oral  SpO2: 98% 98%  99%  Weight: 88.3 kg     Height: 5' 10 (1.778 m)       Intake/Output Summary (Last 24 hours) at 08/22/2023 0734 Last data filed at 08/22/2023 0500 Gross per 24 hour  Intake 3 ml  Output 900 ml  Net -897 ml   Filed Weights   08/21/23 1537  Weight: 88.3 kg    Physical Exam    GEN- NAD, A&O x 3. Normal affect.  Lungs- CTAB, Normal effort.  Heart- Regular rate and rhythm. No M/G/R GI- Soft, NT, ND Extremities- No clubbing, cyanosis, or edema Skin- no rash or  lesion  Labs    CBC No results for input(s): WBC, NEUTROABS, HGB, HCT, MCV, PLT in the last 72 hours. Basic Metabolic Panel Recent Labs    92/78/74 0944 08/22/23 0506  NA 140 142  K 4.8 4.2  CL 102 106  CO2 27 27  GLUCOSE 92 91  BUN 16 11  CREATININE 1.09 1.08  CALCIUM  9.8 9.3  MG 1.8 2.1    Telemetry    NSR with blocked PACs and sinus bradycardia over night, 40-60s (personally reviewed)  Patient Profile     Eugene Castaneda is a 78 y.o. male with a past medical history significant for persistent atrial fibrillation.  They were admitted for tikosyn  load.   Assessment & Plan    Persistent atrial fibrillation Pt remains in NSR on Tikosyn  500 mcg BID  Continue Eliquis  Creatinine, ser  1.08 (07/22 0506) Magnesium   2.1 (07/22 0506) Potassium4.2 (07/22 0506) No electrolyte supplementation needed  Plan for home Thursday if QTc remains stable.  Nocturnal bradycardia SND Asymptomatic Follow.    For questions or updates, please contact [facility] Please consult [email or web address] for  contact info under Cardiology/STEMI.  Signed, Ozell Prentice Passey, PA-C  08/22/2023, 7:34 AM

## 2023-08-22 NOTE — Progress Notes (Signed)
 Pharmacy: Dofetilide  (Tikosyn ) - Follow Up Assessment and Electrolyte Replacement  Pharmacy consulted to assist in monitoring and replacing electrolytes in this 78 y.o. male admitted on 08/21/2023 undergoing dofetilide  initiation. First dofetilide  dose: 08/21/23 at 2045.  Labs:    Component Value Date/Time   K 4.2 08/22/2023 0506   MG 2.1 08/22/2023 0506     Plan: Potassium: K >/= 4: No additional supplementation needed  Magnesium : Mg > 2: No additional supplementation needed   Patient has not required any potassium supplementation this admission. Will continue to monitor the patient as needed.  Thank you for allowing pharmacy to participate in this patient's care   Mendel Barter, PharmD PGY1 Clinical Pharmacist [facility]  08/22/2023 7:23 AM

## 2023-08-22 NOTE — Progress Notes (Signed)
 Multiple pauses as high as 2.86 throughout shift starting ~2245. Pt asymptomatic on assessment. Dr. Cesario w/ [facility] notified via page ~1.30 w/ call back received.

## 2023-08-23 DIAGNOSIS — G7000 Myasthenia gravis without (acute) exacerbation: Secondary | ICD-10-CM

## 2023-08-23 DIAGNOSIS — I4819 Other persistent atrial fibrillation: Secondary | ICD-10-CM | POA: Diagnosis not present

## 2023-08-23 DIAGNOSIS — R001 Bradycardia, unspecified: Secondary | ICD-10-CM | POA: Diagnosis not present

## 2023-08-23 LAB — BASIC METABOLIC PANEL WITH GFR
Anion gap: 11 (ref 5–15)
BUN: 16 mg/dL (ref 8–23)
CO2: 25 mmol/L (ref 22–32)
Calcium: 9.5 mg/dL (ref 8.9–10.3)
Chloride: 104 mmol/L (ref 98–111)
Creatinine, Ser: 1.28 mg/dL — ABNORMAL HIGH (ref 0.61–1.24)
GFR, Estimated: 57 mL/min — ABNORMAL LOW (ref >60)
Glucose, Bld: 90 mg/dL (ref 70–99)
Potassium: 4.1 mmol/L (ref 3.5–5.1)
Sodium: 140 mmol/L (ref 135–145)

## 2023-08-23 LAB — MAGNESIUM: Magnesium: 2 mg/dL (ref 1.7–2.4)

## 2023-08-23 NOTE — Progress Notes (Addendum)
 Pharmacy: Dofetilide  (Tikosyn ) - Follow Up Assessment and Electrolyte Replacement  Pharmacy consulted to assist in monitoring and replacing electrolytes in this 78 y.o. male admitted on 08/21/2023 undergoing dofetilide  initiation. First dofetilide  dose: 08/21/23 at 2045.  Labs:    Component Value Date/Time   K 4.1 08/23/2023 0428   MG 2.0 08/23/2023 0428     Plan: Potassium: K >/= 4: No additional supplementation needed  Magnesium : Mg 1.8-2: Protocol recommends to give Mg 2 gm IV x1 but will discuss Mag supplementation with EP as supplementation can precipitate a myasthenia crisis.   SCr bump today 1.08 > 1.28, calculated CrCl = 59 mL/min using actual BW.  Continue to monitor.   Patient has not required any potassium supplementation this admission. Will continue to monitor the patient as needed.  Thank you for allowing pharmacy to participate in this patient's care   Maurilio Fila, PharmD Clinical Pharmacist 08/23/2023  7:11 AM

## 2023-08-23 NOTE — Progress Notes (Signed)
 Electrophysiology Rounding Note  Patient Name: Eugene Castaneda Date of Encounter: 08/23/2023  Primary Cardiologist: Shelda Bruckner, MD  Electrophysiologist: Eulas FORBES Furbish, MD    Subjective   Pt remains in NSR on Tikosyn  500 mcg BID   QTc from EKG last pm shows stable QTc at ~470 when measured manually. (Didn't cross over but is in paper chart)  The patient is doing well today.  At this time, the patient denies chest pain, shortness of breath, or any new concerns.  Inpatient Medications    Scheduled Meds:  amLODipine   10 mg Oral Daily   apixaban   5 mg Oral BID   ascorbic acid   500 mg Oral Daily   azaTHIOprine   25 mg Oral Daily   azaTHIOprine   50 mg Oral QHS   cholecalciferol   1,000 Units Oral Daily   vitamin B-12  1,000 mcg Oral Daily   dofetilide   500 mcg Oral BID   ferrous sulfate   325 mg Oral BID WC   hydrALAZINE   50 mg Oral BID   levothyroxine   225 mcg Oral Q0600   multivitamin with minerals  1 tablet Oral Daily   pantoprazole   40 mg Oral Daily   rosuvastatin   10 mg Oral Daily   sodium chloride  flush  3 mL Intravenous Q12H   Continuous Infusions:  PRN Meds: mirabegron  ER, sodium chloride  flush   Vital Signs    Vitals:   08/22/23 1515 08/22/23 2029 08/22/23 2314 08/23/23 0352  BP: (!) 141/63 (!) 147/64 (!) 143/69 (!) 158/64  Pulse: (!) 55 (!) 53 (!) 57 (!) 50  Resp: 17 18 18 18   Temp: 98 F (36.7 C) 97.9 F (36.6 C) 97.7 F (36.5 C) 97.7 F (36.5 C)  TempSrc: Oral Oral Oral Oral  SpO2: 99% 99% 99% 99%  Weight:      Height:        Intake/Output Summary (Last 24 hours) at 08/23/2023 0717 Last data filed at 08/23/2023 0356 Gross per 24 hour  Intake 240 ml  Output 500 ml  Net -260 ml   Filed Weights   08/21/23 1537  Weight: 88.3 kg    Physical Exam    GEN- NAD, A&O x 3. Normal affect.  Lungs- CTAB, Normal effort.  Heart- Regular rate and rhythm. No M/G/R GI- Soft, NT, ND Extremities- No clubbing, cyanosis, or edema Skin- no  rash or lesion  Labs    CBC No results for input(s): WBC, NEUTROABS, HGB, HCT, MCV, PLT in the last 72 hours. Basic Metabolic Panel Recent Labs    92/77/74 0506 08/23/23 0428  NA 142 140  K 4.2 4.1  CL 106 104  CO2 27 25  GLUCOSE 91 90  BUN 11 16  CREATININE 1.08 1.28*  CALCIUM  9.3 9.5  MG 2.1 2.0    Telemetry    SB/NSR 50-60s (personally reviewed)  Patient Profile     Eugene Castaneda is a 78 y.o. male with a past medical history significant for persistent atrial fibrillation.  They were admitted for tikosyn  load.   Assessment & Plan    Persistent atrial fibrillation Pt remains in NSR on Tikosyn  500 mcg BID  Continue Eliquis  Creatinine, ser  1.28* (07/23 0428) Magnesium   2.0 (07/23 0428) Potassium4.1 (07/23 0428) No electrolyte supplementation needed  Nocturnal bradycardia Asymptomatic.  Continue to follow  Myasthenia Gravis Magnesium  supplementation can aggravate.   Encouraged increased oral intake of magnesium  rich foods, but will try and avoid Mg supp  Plan for home Friday if  QTc remains stable.   For questions or updates, please contact [facility] Please consult [email or web address] for contact info under Cardiology/STEMI.  Signed, Ozell Prentice Passey, PA-C  08/23/2023, 7:17 AM

## 2023-08-23 NOTE — Progress Notes (Signed)
 Morning EKG reviewed     Shows remains in NSR with stable QTc at ~450 ms when measured manually and corrected for rate.  Continue  Tikosyn  500 mcg BID.   Potassium4.1 (07/23 0428) Magnesium   2.0 (07/23 0428) Creatinine, ser  1.28* (07/23 0428)  Plan for home Thursday if QTc remains stable   Ozell Prentice Passey, [place]  08/23/2023 11:10 AM

## 2023-08-24 ENCOUNTER — Other Ambulatory Visit (HOSPITAL_COMMUNITY): Payer: Self-pay

## 2023-08-24 DIAGNOSIS — I4819 Other persistent atrial fibrillation: Secondary | ICD-10-CM | POA: Diagnosis not present

## 2023-08-24 DIAGNOSIS — R001 Bradycardia, unspecified: Secondary | ICD-10-CM | POA: Diagnosis not present

## 2023-08-24 DIAGNOSIS — G7000 Myasthenia gravis without (acute) exacerbation: Secondary | ICD-10-CM | POA: Diagnosis not present

## 2023-08-24 LAB — BASIC METABOLIC PANEL WITH GFR
Anion gap: 10 (ref 5–15)
BUN: 17 mg/dL (ref 8–23)
CO2: 28 mmol/L (ref 22–32)
Calcium: 9.4 mg/dL (ref 8.9–10.3)
Chloride: 104 mmol/L (ref 98–111)
Creatinine, Ser: 1.17 mg/dL (ref 0.61–1.24)
GFR, Estimated: >60 mL/min (ref >60)
Glucose, Bld: 105 mg/dL — ABNORMAL HIGH (ref 70–99)
Potassium: 4.3 mmol/L (ref 3.5–5.1)
Sodium: 142 mmol/L (ref 135–145)

## 2023-08-24 LAB — MAGNESIUM: Magnesium: 2 mg/dL (ref 1.7–2.4)

## 2023-08-24 MED ORDER — DOFETILIDE 500 MCG PO CAPS
500.0000 ug | ORAL_CAPSULE | Freq: Two times a day (BID) | ORAL | 6 refills | Status: DC
  Filled 2023-08-24: qty 60, 30d supply, fill #0

## 2023-08-24 NOTE — TOC Transition Note (Signed)
 Transition of Care [facility]) - Discharge Note   Patient Details  Name: JIBRI SCHRIEFER MRN: [id number] Date of Birth: 1945/04/30  [facility] CM/SW Contact:  Andrez JULIANNA George, RN Phone Number: 08/24/2023, 10:36 AM   Clinical Narrative:     Pt is discharging home with self care.  Pt was loaded with tikosyn . Benefits check shows a $0 co pay. His first 30 days will be filled by the [facility] pharmacy and pt will pick up at d/c.  Pt has transportation home.  Final next level of care: Home/Self Care Barriers to Discharge: No Barriers Identified   Patient Goals and CMS Choice            Discharge Placement                       Discharge Plan and Services Additional resources added to the After Visit Summary for                                       Social Drivers of Health (SDOH) Interventions SDOH Screenings   Food Insecurity: No Food Insecurity (08/21/2023)  Housing: Low Risk  (08/21/2023)  Transportation Needs: No Transportation Needs (08/21/2023)  Utilities: Not At Risk (08/21/2023)  Social Connections: Moderately Isolated (08/21/2023)  Tobacco Use: Medium Risk (08/22/2023)     Readmission Risk Interventions     No data to display

## 2023-08-24 NOTE — Progress Notes (Signed)
 Pt having multiple pauses throughout night. Longest was 2.58 secs. Pt is asymptomatic. Reviewing previous notes, MD is aware.

## 2023-08-24 NOTE — Progress Notes (Signed)
 EKG from yesterday evening 08/23/2023 reviewed     Shows remains in NSR with stable QTc at ~450 ms when measured manually and corrected for bradycardia.  Continue  Tikosyn  500 mcg BID.   Potassium4.3 (07/24 0355) Magnesium   2.0 (07/24 0355) - Defer supp with h/o MG.  Creatinine, ser  1.17 (07/24 0355)  Plan for home this afternoon if QTc remains stable.   Ozell Prentice Passey, PA-C  08/24/2023 8:21 AM

## 2023-08-24 NOTE — Discharge Summary (Signed)
 ELECTROPHYSIOLOGY DISCHARGE SUMMARY    Patient ID: Eugene Castaneda,  MRN: [id number], DOB/AGE: [birth year] 78 y.o.  Admit date: 08/21/2023 Discharge date: 08/24/2023  Primary Care Physician: [name], [name], MD  Primary Cardiologist: Shelda Bruckner, MD  Electrophysiologist: Dr. Nancey   Primary Discharge Diagnosis:  1.  Persistent atrial fibrillation status post Tikosyn  loading this admission  Secondary Discharge Diagnosis:  Sinus bradycardia  No Known Allergies   Procedures This Admission:  1.  Tikosyn  loading   Brief HPI: Eugene Castaneda is a 78 y.o. male with a past medical history as noted above.  They were referred to EP for treatment options of atrial fibrillation.  Risks, benefits, and alternatives to Tikosyn  were reviewed with the patient who wished to proceed with admission for loading.  Hospital Course:  The patient was admitted and Tikosyn  was initiated.  Renal function and electrolytes were followed during the hospitalization.  Their QTc remained stable. The patient presented in sinus rhythm and did not require cardioversion. The patients QTc remained stable. They were monitored on telemetry up to discharge. On the day of discharge, they were examined by Dr. Nancey  who considered them stable for discharge to home.  Follow-up has been arranged with the Atrial Fibrillation clinic in approximately 1 week.   Physical Exam: Vitals:   08/23/23 1947 08/23/23 2232 08/24/23 0521 08/24/23 0728  BP: (!) 144/69 (!) 157/68 119/87 (!) 160/74  Pulse: (!) 52  (!) 52 (!) 50  Resp: 20  18 18   Temp: (!) 97.5 F (36.4 C)  97.7 F (36.5 C) 97.7 F (36.5 C)  TempSrc: Oral  Oral Oral  SpO2:   97% 100%  Weight:      Height:        GEN- NAD, A&O x 3. Normal affect.  Lungs- CTAB, Normal effort.  Heart- Regular rate and rhythm. No M/G/R GI- Soft, NT, ND Extremities- No clubbing, cyanosis, or edema Skin- no rash or lesion  Labs:   Lab Results  Component Value  Date   WBC 5.3 04/14/2022   HGB 14.3 04/14/2022   HCT 42.6 04/14/2022   MCV 100.5 (H) 04/14/2022   PLT 224 04/14/2022    Recent Labs  Lab 08/24/23 0355  NA 142  K 4.3  CL 104  CO2 28  BUN 17  CREATININE 1.17  CALCIUM  9.4  GLUCOSE 105*    Discharge Medications:  Allergies as of 08/24/2023   No Known Allergies      Medication List     TAKE these medications    amLODipine  10 MG tablet Commonly known as: NORVASC  Take 10 mg by mouth in the morning.   ascorbic acid  500 MG tablet Commonly known as: VITAMIN C  Take 500 mg by mouth every evening.   azaTHIOprine  50 MG tablet Commonly known as: IMURAN  Take 0.5 tablets (25 mg total) by mouth in the morning AND 1 tablet (50 mg total) every evening.   Catheter Extension Tubing Misc Inject 1 fluid ounce into the vein as needed. Use as directed   cholecalciferol  25 MCG (1000 UNIT) tablet Commonly known as: VITAMIN D3 Take 1,000 Units by mouth every evening. 1 Tablet Daily   clotrimazole-betamethasone cream Commonly known as: LOTRISONE Apply 1 application  topically 2 (two) times daily as needed (irritation). What changed: when to take this   CRANBERRY PO Take 1 tablet by mouth every evening. 650 mg daily   CYANOCOBALAMIN  PO Take 1 Dose by mouth in the morning. Liquid   Denta  5000 Plus 1.1 % Crea dental cream Generic drug: sodium fluoride Take 1 Application by mouth at bedtime. What changed:  when to take this reasons to take this   dofetilide  500 MCG capsule Commonly known as: TIKOSYN  Take 1 capsule (500 mcg total) by mouth 2 (two) times daily.   Eliquis  5 MG Tabs tablet Generic drug: apixaban  Take 1 tablet by mouth twice daily What changed: how much to take   empagliflozin 25 MG Tabs tablet Commonly known as: JARDIANCE Take 25 mg by mouth in the morning.   ferrous sulfate  325 (65 FE) MG EC tablet Take 325 mg by mouth 2 (two) times daily.   Fish Oil Oil Take 1,000 mg by mouth in the morning.    gluconic acid-citric acid irrigation Irrigate with 30 mLs as directed as needed.   hydrALAZINE  50 MG tablet Commonly known as: APRESOLINE  Take 1 tablet (50 mg total) by mouth 2 (two) times daily.   levothyroxine  200 MCG tablet Commonly known as: SYNTHROID  Take 200 mcg by mouth daily before breakfast. Take 1 tablet by mouth with a 25mcg Levothyroxine  for a combined dose of 225mcg.   levothyroxine  25 MCG tablet Commonly known as: SYNTHROID  Take 25 mcg by mouth every morning. Take 1 tablet by mouth with a 200mcg Levothyroxine  for a combined dose of 225mcg.   LUPRON  IJ Inject as directed as needed (Prostate cancer).   mirabegron  ER 50 MG Tb24 tablet Commonly known as: MYRBETRIQ  Take 50 mg by mouth in the morning.   multivitamin tablet Take 1 tablet by mouth every evening.   omeprazole 20 MG capsule Commonly known as: PRILOSEC Take 20 mg by mouth in the morning.   rosuvastatin  10 MG tablet Commonly known as: CRESTOR  Take 10 mg by mouth in the morning.   sterile water  Irrigate with 1,000,000 mLs as directed as needed. Use as directed   sterile water  Use to flush catherter as directed   triamcinolone  cream 0.1 % Commonly known as: KENALOG  Apply 1 Application topically daily as needed. What changed: when to take this        Disposition:  Home with follow up in AF clinic in 1 week as in AVS.   Duration of Discharge Encounter:  APP time: 23  Signed, Ozell Prentice Passey, PA-C  08/24/2023 10:18 AM

## 2023-08-24 NOTE — Plan of Care (Signed)
  Problem: Education: Goal: Knowledge of General Education information will improve Description: Including pain rating scale, medication(s)/side effects and non-pharmacologic comfort measures Outcome: Adequate for Discharge   Problem: Health Behavior/Discharge Planning: Goal: Ability to manage health-related needs will improve Outcome: Adequate for Discharge   Problem: Clinical Measurements: Goal: Ability to maintain clinical measurements within normal limits will improve Outcome: Adequate for Discharge Goal: Will remain free from infection Outcome: Adequate for Discharge Goal: Diagnostic test results will improve Outcome: Adequate for Discharge Goal: Respiratory complications will improve Outcome: Adequate for Discharge Goal: Cardiovascular complication will be avoided Outcome: Adequate for Discharge   Problem: Clinical Measurements: Goal: Ability to maintain clinical measurements within normal limits will improve Outcome: Adequate for Discharge Goal: Will remain free from infection Outcome: Adequate for Discharge Goal: Diagnostic test results will improve Outcome: Adequate for Discharge Goal: Respiratory complications will improve Outcome: Adequate for Discharge Goal: Cardiovascular complication will be avoided Outcome: Adequate for Discharge   Problem: Activity: Goal: Risk for activity intolerance will decrease Outcome: Adequate for Discharge   Problem: Nutrition: Goal: Adequate nutrition will be maintained Outcome: Adequate for Discharge   Problem: Coping: Goal: Level of anxiety will decrease Outcome: Adequate for Discharge   Problem: Elimination: Goal: Will not experience complications related to bowel motility Outcome: Adequate for Discharge Goal: Will not experience complications related to urinary retention Outcome: Adequate for Discharge   Problem: Pain Managment: Goal: General experience of comfort will improve and/or be controlled Outcome: Adequate for  Discharge   Problem: Safety: Goal: Ability to remain free from injury will improve Outcome: Adequate for Discharge   Problem: Skin Integrity: Goal: Risk for impaired skin integrity will decrease Outcome: Adequate for Discharge   Problem: Education: Goal: Knowledge of disease or condition will improve Outcome: Adequate for Discharge Goal: Understanding of medication regimen will improve Outcome: Adequate for Discharge Goal: Individualized Educational Video(s) Outcome: Adequate for Discharge   Problem: Activity: Goal: Ability to tolerate increased activity will improve Outcome: Adequate for Discharge   Problem: Cardiac: Goal: Ability to achieve and maintain adequate cardiopulmonary perfusion will improve Outcome: Adequate for Discharge   Problem: Health Behavior/Discharge Planning: Goal: Ability to safely manage health-related needs after discharge will improve Outcome: Adequate for Discharge

## 2023-08-24 NOTE — Discharge Instructions (Signed)
 Dofetilide  Capsules What is this medication? DOFETILIDE  (doe FET il ide) treats a fast or irregular heartbeat (arrhythmia). It works by slowing down overactive electric signals in the heart, which stabilizes your heart rhythm. It belongs to a group of medications called antiarrhythmics. This medicine may be used for other purposes; ask your health care provider or pharmacist if you have questions. COMMON BRAND NAME(S): Tikosyn  What should I tell my care team before I take this medication? They need to know if you have any of these conditions: Heart disease History of irregular heartbeat History of low levels of potassium or magnesium  in the blood Kidney disease Liver disease An unusual or allergic reaction to dofetilide , other medications, foods, dyes, or preservatives Pregnant or trying to get pregnant Breast-feeding How should I use this medication? Take this medication by mouth with a glass of water. Follow the directions on the prescription label. Do not take with grapefruit juice. You can take it with or without food. If it upsets your stomach, take it with food. Take your medication at regular intervals. Do not take it more often than directed. Do not stop taking except on your care team's advice. A special MedGuide will be given to you by the pharmacist with each prescription and refill. Be sure to read this information carefully each time. Talk to your care team about the use of this medication in children. Special care may be needed. Overdosage: If you think you have taken too much of this medicine contact a poison control center or emergency room at once. NOTE: This medicine is only for you. Do not share this medicine with others. What if I miss a dose? If you miss a dose, skip it. Take your next dose at the normal time. Do not take extra or 2 doses at the same time to make up for the missed dose. What may interact with this medication? Do not take this medication with any of  the following: Benadryl (Diphenhydramine) Cimetidine (Tagamet) Cisapride Dolutegravir Dronedarone Erdafitinib Hydrochlorothiazide Immodium Ketoconazole Megestrol Pimozide Prochlorperazine Thioridazine Trimethoprim Verapamil This medication may also interact with the following: Amiloride Cannabinoids Certain antibiotics like erythromycin or clarithromycin  Certain antiviral medications for HIV or hepatitis Certain medications for depression, anxiety, or psychotic disorders Digoxin  Diltiazem  Grapefruit juice Metformin  Nefazodone Other medications that prolong the QT interval (an abnormal heart rhythm) Quinine Triamterene Zafirlukast Ziprasidone This list may not describe all possible interactions. Give your health care provider a list of all the medicines, herbs, non-prescription drugs, or dietary supplements you use. Also tell them if you smoke, drink alcohol, or use illegal drugs. Some items may interact with your medicine. What should I watch for while using this medication? Your condition will be monitored carefully while you are receiving this medication. What side effects may I notice from receiving this medication? Side effects that you should report to your care team as soon as possible: Allergic reactions--skin rash, itching, hives, swelling of the face, lips, tongue, or throat Chest pain Heart rhythm changes--fast or irregular heartbeat, dizziness, feeling faint or lightheaded, chest pain, trouble breathing Side effects that usually do not require medical attention (report to your care team if they continue or are bothersome): Dizziness Headache Nausea Stomach pain Trouble sleeping This list may not describe all possible side effects. Call your doctor for medical advice about side effects. You may report side effects to FDA at 1-800-FDA-1088. Where should I keep my medication? Keep out of the reach of children. Store at room temperature between 15  and 30  degrees C (59 and 86 degrees F). Throw away any unused medication after the expiration date. NOTE: This sheet is a summary. It may not cover all possible information. If you have questions about this medicine, talk to your doctor, pharmacist, or health care provider.  2024 Elsevier/Gold Standard (2020-12-18 00:00:00)

## 2023-08-24 NOTE — Progress Notes (Signed)
 Pharmacy: Dofetilide  (Tikosyn ) - Follow Up Assessment and Electrolyte Replacement  Pharmacy consulted to assist in monitoring and replacing electrolytes in this 78 y.o. male admitted on 08/21/2023 undergoing dofetilide  initiation. First dofetilide  dose: 08/21/23 at 2045.  Labs:    Component Value Date/Time   K 4.3 08/24/2023 0355   MG 2.0 08/24/2023 0355     Plan: Potassium: K >/= 4: No additional supplementation needed  Magnesium : Mg 2: Protocol recommends to give Mg 2 gm IV x1 but will discuss Mag supplementation with EP as supplementation can precipitate a myasthenia crisis.   SCr drop today 1.28 > 1.17, calculated CrCl = 58.2 mL/min using actual BW.  Continue to monitor.  Patient has not required any potassium supplementation this admission. Will continue to monitor the patient as needed.  Thank you for allowing pharmacy to participate in this patient's care    Eugene Castaneda, PharmD PGY1 Health-System Pharmacy Administration and Leadership Resident [facility]  08/24/2023 7:35 AM

## 2023-08-24 NOTE — Progress Notes (Signed)
 Reviewed AVS, patient expressed understanding of medications, MD follow up reviewed.   Patient states all belongings brought to the hospital at time of admission are accounted for and packed to take home.  Picked up medications from [facility] pharmacy. Pt transported to Discharge lounge to wait for transportation home.

## 2023-09-01 ENCOUNTER — Encounter (HOSPITAL_COMMUNITY): Payer: Self-pay | Admitting: *Deleted

## 2023-09-01 ENCOUNTER — Ambulatory Visit (HOSPITAL_COMMUNITY)
Admission: RE | Admit: 2023-09-01 | Discharge: 2023-09-01 | Disposition: A | Discharge status: Home / Self Care | Source: Ambulatory Visit | Attending: Internal Medicine | Admitting: Internal Medicine

## 2023-09-01 VITALS — BP 152/64 | HR 55 | Ht 70.0 in | Wt 193.4 lb

## 2023-09-01 DIAGNOSIS — I480 Paroxysmal atrial fibrillation: Secondary | ICD-10-CM | POA: Insufficient documentation

## 2023-09-01 DIAGNOSIS — Z5181 Encounter for therapeutic drug level monitoring: Secondary | ICD-10-CM | POA: Diagnosis not present

## 2023-09-01 DIAGNOSIS — D6869 Other thrombophilia: Secondary | ICD-10-CM | POA: Insufficient documentation

## 2023-09-01 DIAGNOSIS — I4891 Unspecified atrial fibrillation: Secondary | ICD-10-CM | POA: Insufficient documentation

## 2023-09-01 DIAGNOSIS — Z79899 Other long term (current) drug therapy: Secondary | ICD-10-CM | POA: Diagnosis not present

## 2023-09-01 NOTE — Progress Notes (Signed)
 Primary Care Physician: [name], [name], MD Referring Physician: Dr. Kelsie  Primary Cardiologist: Dr Lonni  Primary EP: Dr Nancey   Eugene Castaneda is a 78 y.o. male with a h/o paroxysmal afib, CAD, CKD, HLD, HTN, hypothyroidism who presents for follow up in the [facility]. Initial afib ablation several years ago with Dr. Epifanio in [place]. He had been maintained on flecainide  without any daily AV nodal agents but this was discontinued due to CAD diagnosis. Transitioned to Multaq  which was discontinued 07/07/23 due to bradycardia/junctional rhythm.    Since stopping Multaq , he has been having much more frequent palpitations. Review of his Kardia mobile strips showed SR with PACs and also true paroxysms of afib.   Patient returns for follow up for atrial fibrillation and dofetilide  admission. He is in SR today. He has discontinued hydrochlorothiazide. He did have some blood in his urine after his catheter was changed but this is not unusual for him and has resolved.   On follow up 09/01/23, patient is here for Tikosyn  surveillance. S/p Tikosyn  admission 7/21-24/2025. Patient did not require cardioversion and discharged on 500 mcg BID. He had two episodes of Afib since hospital discharge. No missed doses of Eliquis .   Today, he  denies symptoms of palpitations, chest pain, shortness of breath, orthopnea, PND, lower extremity edema, dizziness, presyncope, syncope, snoring, daytime somnolence, bleeding, or neurologic sequela. The patient is tolerating medications without difficulties and is otherwise without complaint today.    Past Medical History:  Diagnosis Date   Anemia    on meds   CAD (coronary artery disease), native coronary artery 11/13/2017   Coronary calcification noted on CT scan    Chronic kidney disease (CKD), stage III (moderate) (HCC) 11/13/2017   has suprapubic catheter   Diplopia    Foley catheter present    pt has supra-pubic foley  catheter in place/since 2016   GERD (gastroesophageal reflux disease)    on meds   Hyperlipemia    on meds   Hypertension    on meds   Hypertensive heart disease without CHF 11/13/2017   Hypothyroidism    on meds   Impaired fasting glucose    MG (myasthenia gravis) (HCC)    Paroxysmal atrial fibrillation (HCC) 11/13/2017   RF ablation Dr. Epifanio 2010 [facility] CHADSVASC 3   Prostate cancer [facility]) 2009   Urethral obstruction    Urinary retention    Suprapubic Catheter placed 04/25/15    Current Outpatient Medications  Medication Sig Dispense Refill   amLODipine  (NORVASC ) 10 MG tablet Take 10 mg by mouth in the morning.     apixaban  (ELIQUIS ) 5 MG TABS tablet Take 1 tablet by mouth twice daily 180 tablet 1   azaTHIOprine  (IMURAN ) 50 MG tablet Take 0.5 tablets (25 mg total) by mouth in the morning AND 1 tablet (50 mg total) every evening. 180 tablet 3   cholecalciferol  (VITAMIN D3) 25 MCG (1000 UNIT) tablet Take 1,000 Units by mouth every evening. 1 Tablet Daily     clotrimazole-betamethasone (LOTRISONE) cream Apply 1 application  topically 2 (two) times daily as needed (irritation).     CRANBERRY PO Take 1 tablet by mouth every evening. 650 mg daily     CYANOCOBALAMIN  PO Take 1 Dose by mouth in the morning. Liquid     DENTA 5000 PLUS 1.1 % CREA dental cream Take 1 Application by mouth at bedtime.     dofetilide  (TIKOSYN ) 500 MCG capsule Take 1 capsule (500 mcg  total) by mouth 2 (two) times daily. 60 capsule 6   empagliflozin (JARDIANCE) 25 MG TABS tablet Take 25 mg by mouth in the morning.     ferrous sulfate  325 (65 FE) MG EC tablet Take 325 mg by mouth 2 (two) times daily.     Fish Oil OIL Take 1,000 mg by mouth in the morning.     gluconic acid-citric acid (RENACIDIN ) irrigation Irrigate with 30 mLs as directed as needed.     hydrALAZINE  (APRESOLINE ) 50 MG tablet Take 1 tablet (50 mg total) by mouth 2 (two) times daily. 180 tablet 1   Incontinence Supplies (CATHETER EXTENSION  TUBING) MISC Inject 1 fluid ounce into the vein as needed. Use as directed     Leuprolide  Acetate (LUPRON  IJ) Inject as directed as needed (Prostate cancer).      levothyroxine  (SYNTHROID ) 200 MCG tablet Take 200 mcg by mouth daily before breakfast. Take 1 tablet by mouth with a 25mcg Levothyroxine  for a combined dose of 225mcg.     levothyroxine  (SYNTHROID ) 25 MCG tablet Take 25 mcg by mouth every morning. Take 1 tablet by mouth with a 200mcg Levothyroxine  for a combined dose of 225mcg.     mirabegron  ER (MYRBETRIQ ) 50 MG TB24 tablet Take 50 mg by mouth in the morning.     Multiple Vitamin (MULTIVITAMIN) tablet Take 1 tablet by mouth every evening.     omeprazole (PRILOSEC) 20 MG capsule Take 20 mg by mouth in the morning.     rosuvastatin  (CRESTOR ) 10 MG tablet Take 10 mg by mouth in the morning.     triamcinolone  cream (KENALOG ) 0.1 % Apply 1 Application topically daily as needed.     vitamin C  (ASCORBIC ACID ) 500 MG tablet Take 500 mg by mouth every evening.     Water  For Irrigation, Sterile (STERILE WATER  FOR IRRIGATION) Irrigate with 1,000,000 mLs as directed as needed. Use as directed     Water  For Irrigation, Sterile (STERILE WATER ) Use to flush catherter as directed 500 mL 11   No current facility-administered medications for this encounter.    ROS- All systems are reviewed and negative except as per the HPI above  Physical Exam: Vitals:   09/01/23 1309  BP: (!) 152/64  Pulse: (!) 55  Weight: 87.7 kg  Height: 5' 10 (1.778 m)    Wt Readings from Last 3 Encounters:  09/01/23 87.7 kg  08/21/23 88.3 kg  08/21/23 88.6 kg   GEN- The patient is well appearing, alert and oriented x 3 today.   Neck - no JVD or carotid bruit noted Lungs- Clear to ausculation bilaterally, normal work of breathing Heart- Regular rate and rhythm, no murmurs, rubs or gallops, PMI not laterally displaced Extremities- no clubbing, cyanosis, or edema Skin - no rash or ecchymosis noted   EKG today  demonstrates Vent. rate 55 BPM PR interval 258 ms QRS duration 86 ms QT/QTcB 478/457 ms P-R-T axes 81 -39 8 Sinus bradycardia with 1st degree A-V block Left axis deviation Pulmonary disease pattern Abnormal ECG When compared with ECG of 24-Aug-2023 10:01, No significant change was found Confirmed by Claudene Pacific ([id number]) on 09/01/2023 1:45:25 PM   CHA2DS2-VASc Score = 4  The patient's score is based upon: CHF History: 0 HTN History: 1 Diabetes History: 0 Stroke History: 0 Vascular Disease History: 1 Age Score: 2 Gender Score: 0       ASSESSMENT AND PLAN: Paroxysmal Atrial Fibrillation/atrial flutter (ICD10:  I48.0) The patient's CHA2DS2-VASc score is 4, indicating a  4.8% annual risk of stroke.   S/p remote afib ablation by Dr Epifanio, repeat ablations with Dr Kelsie 07/2019 and 02/2020. Flecainide  discontinued due to CAD. Multaq  discontinued due to junctional bradycardia.  S/p Tikosyn  admission 7/21-24/2025.  Patient is currently in NSR. He appears to be doing well overall; will continue current AAD therapy.  High risk medication monitoring (ICD10: U5195107) Patient requires ongoing monitoring for anti-arrhythmic medication which has the potential to cause life threatening arrhythmias or AV block. Qtc stable. Continue Tikosyn  500 mcg BID. Bmet and mag drawn today.   Secondary Hypercoagulable State (ICD10:  D68.69) The patient is at significant risk for stroke/thromboembolism based upon his CHA2DS2-VASc Score of 4.  Continue Apixaban  (Eliquis ).  No bleeding issues.  HTN Stable today. Now off hydrochlorothiazide  CAD CAC score 2280 No anginal symptoms.  Bradycardia/junctional rhythm Improved off Multaq , limits medication options.   Follow up 1 month for Tikosyn  surveillance.    Dorn Heinrich, PA-C Afib Clinic [facility] [address] [place], [place] [address] [phone]

## 2023-09-02 LAB — BASIC METABOLIC PANEL WITH GFR
BUN/Creatinine Ratio: 13 (ref 10–24)
BUN: 15 mg/dL (ref 8–27)
CO2: 23 mmol/L (ref 20–29)
Calcium: 9.3 mg/dL (ref 8.6–10.2)
Chloride: 101 mmol/L (ref 96–106)
Creatinine, Ser: 1.2 mg/dL (ref 0.76–1.27)
Glucose: 80 mg/dL (ref 70–99)
Potassium: 4.1 mmol/L (ref 3.5–5.2)
Sodium: 139 mmol/L (ref 134–144)
eGFR: 62 mL/min/1.73 (ref >59)

## 2023-09-02 LAB — MAGNESIUM: Magnesium: 2 mg/dL (ref 1.6–2.3)

## 2023-09-04 ENCOUNTER — Ambulatory Visit (HOSPITAL_COMMUNITY): Payer: Self-pay | Admitting: Internal Medicine

## 2023-09-06 ENCOUNTER — Other Ambulatory Visit: Payer: Self-pay | Admitting: Cardiology

## 2023-09-06 ENCOUNTER — Other Ambulatory Visit (HOSPITAL_COMMUNITY): Payer: Self-pay | Admitting: *Deleted

## 2023-09-06 DIAGNOSIS — I480 Paroxysmal atrial fibrillation: Secondary | ICD-10-CM

## 2023-09-06 MED ORDER — DOFETILIDE 500 MCG PO CAPS: 500.0000 ug | ORAL_CAPSULE | Freq: Two times a day (BID) | ORAL | 6 refills | Status: DC

## 2023-09-06 NOTE — Telephone Encounter (Signed)
 Prescription refill request for Eliquis  received. Indication: PAF Last office visit: 09/01/23  JINNY Heinrich PA-C Scr: 1.20 on 09/01/23  Epic Age: 78 Weight: 87.7kg  Based on above findings Eliquis  5mg  twice daily is the appropriate dose.  Refill approved.

## 2023-09-12 ENCOUNTER — Other Ambulatory Visit (HOSPITAL_BASED_OUTPATIENT_CLINIC_OR_DEPARTMENT_OTHER): Payer: Self-pay

## 2023-09-13 DIAGNOSIS — Z9359 Other cystostomy status: Secondary | ICD-10-CM | POA: Diagnosis not present

## 2023-09-13 DIAGNOSIS — Z466 Encounter for fitting and adjustment of urinary device: Secondary | ICD-10-CM | POA: Diagnosis not present

## 2023-09-14 ENCOUNTER — Other Ambulatory Visit (HOSPITAL_BASED_OUTPATIENT_CLINIC_OR_DEPARTMENT_OTHER): Payer: Self-pay

## 2023-09-16 ENCOUNTER — Other Ambulatory Visit (HOSPITAL_BASED_OUTPATIENT_CLINIC_OR_DEPARTMENT_OTHER): Payer: Self-pay

## 2023-09-16 ENCOUNTER — Other Ambulatory Visit (HOSPITAL_COMMUNITY): Payer: Self-pay | Admitting: Internal Medicine

## 2023-09-16 ENCOUNTER — Other Ambulatory Visit: Payer: Self-pay | Admitting: Student

## 2023-09-18 ENCOUNTER — Other Ambulatory Visit (HOSPITAL_BASED_OUTPATIENT_CLINIC_OR_DEPARTMENT_OTHER): Payer: Self-pay

## 2023-09-19 ENCOUNTER — Other Ambulatory Visit (HOSPITAL_BASED_OUTPATIENT_CLINIC_OR_DEPARTMENT_OTHER): Payer: Self-pay

## 2023-09-20 ENCOUNTER — Other Ambulatory Visit (HOSPITAL_BASED_OUTPATIENT_CLINIC_OR_DEPARTMENT_OTHER): Payer: Self-pay

## 2023-09-20 ENCOUNTER — Encounter (HOSPITAL_BASED_OUTPATIENT_CLINIC_OR_DEPARTMENT_OTHER): Payer: Self-pay

## 2023-09-21 ENCOUNTER — Other Ambulatory Visit (HOSPITAL_BASED_OUTPATIENT_CLINIC_OR_DEPARTMENT_OTHER): Payer: Self-pay

## 2023-09-21 DIAGNOSIS — N1831 Chronic kidney disease, stage 3a: Secondary | ICD-10-CM | POA: Diagnosis not present

## 2023-09-21 DIAGNOSIS — I129 Hypertensive chronic kidney disease with stage 1 through stage 4 chronic kidney disease, or unspecified chronic kidney disease: Secondary | ICD-10-CM | POA: Diagnosis not present

## 2023-09-22 ENCOUNTER — Other Ambulatory Visit (HOSPITAL_BASED_OUTPATIENT_CLINIC_OR_DEPARTMENT_OTHER): Payer: Self-pay

## 2023-09-22 ENCOUNTER — Encounter (HOSPITAL_BASED_OUTPATIENT_CLINIC_OR_DEPARTMENT_OTHER): Payer: Self-pay

## 2023-09-26 ENCOUNTER — Other Ambulatory Visit (HOSPITAL_BASED_OUTPATIENT_CLINIC_OR_DEPARTMENT_OTHER): Payer: Self-pay

## 2023-09-27 DIAGNOSIS — L821 Other seborrheic keratosis: Secondary | ICD-10-CM | POA: Diagnosis not present

## 2023-09-27 DIAGNOSIS — L570 Actinic keratosis: Secondary | ICD-10-CM | POA: Diagnosis not present

## 2023-09-27 DIAGNOSIS — Z08 Encounter for follow-up examination after completed treatment for malignant neoplasm: Secondary | ICD-10-CM | POA: Diagnosis not present

## 2023-09-27 DIAGNOSIS — L814 Other melanin hyperpigmentation: Secondary | ICD-10-CM | POA: Diagnosis not present

## 2023-09-27 DIAGNOSIS — D225 Melanocytic nevi of trunk: Secondary | ICD-10-CM | POA: Diagnosis not present

## 2023-09-27 DIAGNOSIS — Z85828 Personal history of other malignant neoplasm of skin: Secondary | ICD-10-CM | POA: Diagnosis not present

## 2023-10-03 ENCOUNTER — Encounter (HOSPITAL_BASED_OUTPATIENT_CLINIC_OR_DEPARTMENT_OTHER): Payer: Self-pay

## 2023-10-03 DIAGNOSIS — R310 Gross hematuria: Secondary | ICD-10-CM | POA: Diagnosis not present

## 2023-10-05 ENCOUNTER — Ambulatory Visit (HOSPITAL_COMMUNITY)
Admission: RE | Admit: 2023-10-05 | Discharge: 2023-10-05 | Disposition: A | Discharge status: Home / Self Care | Source: Ambulatory Visit | Attending: Physician Assistant | Admitting: Physician Assistant

## 2023-10-05 ENCOUNTER — Encounter: Payer: Self-pay | Admitting: Cardiovascular Disease

## 2023-10-05 VITALS — BP 120/60 | HR 53 | Ht 70.0 in | Wt 190.4 lb

## 2023-10-05 DIAGNOSIS — D6869 Other thrombophilia: Secondary | ICD-10-CM | POA: Diagnosis not present

## 2023-10-05 DIAGNOSIS — R310 Gross hematuria: Secondary | ICD-10-CM | POA: Diagnosis not present

## 2023-10-05 DIAGNOSIS — Z79899 Other long term (current) drug therapy: Secondary | ICD-10-CM | POA: Diagnosis not present

## 2023-10-05 DIAGNOSIS — I4891 Unspecified atrial fibrillation: Secondary | ICD-10-CM | POA: Diagnosis not present

## 2023-10-05 DIAGNOSIS — N390 Urinary tract infection, site not specified: Secondary | ICD-10-CM | POA: Diagnosis not present

## 2023-10-05 DIAGNOSIS — R319 Hematuria, unspecified: Secondary | ICD-10-CM | POA: Diagnosis not present

## 2023-10-05 DIAGNOSIS — C61 Malignant neoplasm of prostate: Secondary | ICD-10-CM | POA: Diagnosis not present

## 2023-10-05 DIAGNOSIS — Z7901 Long term (current) use of anticoagulants: Secondary | ICD-10-CM | POA: Diagnosis not present

## 2023-10-05 DIAGNOSIS — Z5181 Encounter for therapeutic drug level monitoring: Secondary | ICD-10-CM | POA: Insufficient documentation

## 2023-10-05 DIAGNOSIS — I480 Paroxysmal atrial fibrillation: Secondary | ICD-10-CM | POA: Insufficient documentation

## 2023-10-05 DIAGNOSIS — Z923 Personal history of irradiation: Secondary | ICD-10-CM | POA: Diagnosis not present

## 2023-10-05 NOTE — Progress Notes (Signed)
 Primary Care Physician: [name], [name], MD Referring Physician: Dr. Kelsie  Primary Cardiologist: Dr Lonni  Primary EP: Dr Nancey   Eugene Castaneda is a 78 y.o. male with a h/o paroxysmal afib, CAD, CKD, HLD, HTN, hypothyroidism who presents for follow up in the [facility] Atrial Fibrillation Clinic. Initial afib ablation several years ago with Dr. Epifanio in [place]. He had been maintained on flecainide  without any daily AV nodal agents but this was discontinued due to CAD diagnosis. Transitioned to Multaq  which was discontinued 07/07/23 due to bradycardia/junctional rhythm.    Since stopping Multaq , he has been having much more frequent palpitations. Review of his Kardia mobile strips showed SR with PACs and also true paroxysms of afib. Patient is s/p dofetilide  loading 08/2023.  Patient returns for follow up for atrial fibrillation and dofetilide  monitoring. He reports that he has done well from an afib standpoint with no interim episodes of afib. He did note dark urine in his catheter and saw his urologist this AM, started on cefdinir and methenamine.   Today, he  denies symptoms of palpitations, chest pain, shortness of breath, orthopnea, PND, lower extremity edema, dizziness, presyncope, syncope, snoring, daytime somnolence, bleeding, or neurologic sequela. The patient is tolerating medications without difficulties and is otherwise without complaint today.    Past Medical History:  Diagnosis Date   Anemia    on meds   CAD (coronary artery disease), native coronary artery 11/13/2017   Coronary calcification noted on CT scan    Chronic kidney disease (CKD), stage III (moderate) (HCC) 11/13/2017   has suprapubic catheter   Diplopia    Foley catheter present    pt has supra-pubic foley catheter in place/since 2016   GERD (gastroesophageal reflux disease)    on meds   Hyperlipemia    on meds   Hypertension    on meds   Hypertensive heart disease without CHF 11/13/2017    Hypothyroidism    on meds   Impaired fasting glucose    MG (myasthenia gravis) (HCC)    Paroxysmal atrial fibrillation (HCC) 11/13/2017   RF ablation Dr. Epifanio 2010 [facility] CHADSVASC 3   Prostate cancer [facility]) 2009   Urethral obstruction    Urinary retention    Suprapubic Catheter placed 04/25/15    Current Outpatient Medications  Medication Sig Dispense Refill   amLODipine  (NORVASC ) 10 MG tablet Take 10 mg by mouth in the morning.     apixaban  (ELIQUIS ) 5 MG TABS tablet Take 1 tablet by mouth twice daily 180 tablet 1   azaTHIOprine  (IMURAN ) 50 MG tablet Take 0.5 tablets (25 mg total) by mouth in the morning AND 1 tablet (50 mg total) every evening. 180 tablet 3   cefdinir (OMNICEF) 300 MG capsule Take 300 mg by mouth 2 (two) times daily.     cholecalciferol  (VITAMIN D3) 25 MCG (1000 UNIT) tablet Take 1,000 Units by mouth every evening. 1 Tablet Daily     clotrimazole-betamethasone (LOTRISONE) cream Apply 1 application  topically 2 (two) times daily as needed (irritation).     CRANBERRY PO Take 1 tablet by mouth every evening. 650 mg daily     CYANOCOBALAMIN  PO Take 1 Dose by mouth in the morning. Liquid     DENTA 5000 PLUS 1.1 % CREA dental cream Take 1 Application by mouth at bedtime.     dofetilide  (TIKOSYN ) 500 MCG capsule Take 1 capsule (500 mcg total) by mouth 2 (two) times daily. 60 capsule 6   empagliflozin (JARDIANCE)  25 MG TABS tablet Take 25 mg by mouth in the morning.     ferrous sulfate  325 (65 FE) MG EC tablet Take 325 mg by mouth 2 (two) times daily.     Fish Oil OIL Take 1,000 mg by mouth in the morning.     gluconic acid-citric acid (RENACIDIN ) irrigation Irrigate with 30 mLs as directed as needed.     hydrALAZINE  (APRESOLINE ) 50 MG tablet Take 1 tablet (50 mg total) by mouth 2 (two) times daily. 180 tablet 1   Incontinence Supplies (CATHETER EXTENSION TUBING) MISC Inject 1 fluid ounce into the vein as needed. Use as directed     Leuprolide  Acetate (LUPRON  IJ)  Inject as directed as needed (Prostate cancer).      levothyroxine  (SYNTHROID ) 200 MCG tablet Take 200 mcg by mouth daily before breakfast. Take 1 tablet by mouth with a 25mcg Levothyroxine  for a combined dose of 225mcg.     levothyroxine  (SYNTHROID ) 25 MCG tablet Take 25 mcg by mouth every morning. Take 1 tablet by mouth with a 200mcg Levothyroxine  for a combined dose of 225mcg.     methenamine (HIPREX) 1 g tablet Take 1 g by mouth 2 (two) times daily.     mirabegron  ER (MYRBETRIQ ) 50 MG TB24 tablet Take 50 mg by mouth in the morning.     Multiple Vitamin (MULTIVITAMIN) tablet Take 1 tablet by mouth every evening.     omeprazole (PRILOSEC) 20 MG capsule Take 20 mg by mouth in the morning.     rosuvastatin  (CRESTOR ) 10 MG tablet Take 10 mg by mouth in the morning.     triamcinolone  cream (KENALOG ) 0.1 % Apply 1 Application topically daily as needed.     vitamin C  (ASCORBIC ACID ) 500 MG tablet Take 500 mg by mouth every evening.     Water  For Irrigation, Sterile (STERILE WATER  FOR IRRIGATION) Irrigate with 1,000,000 mLs as directed as needed. Use as directed     Water  For Irrigation, Sterile (STERILE WATER ) Use to flush catherter as directed 500 mL 11   No current facility-administered medications for this encounter.    ROS- All systems are reviewed and negative except as per the HPI above  Physical Exam: Vitals:   10/05/23 1351  BP: 120/60  Pulse: (!) 53  Weight: 86.4 kg  Height: 5' 10 (1.778 m)    Wt Readings from Last 3 Encounters:  10/05/23 86.4 kg  09/01/23 87.7 kg  08/21/23 88.3 kg    GEN: Well nourished, well developed in no acute distress CARDIAC: Regular rate and rhythm, no murmurs, rubs, gallops RESPIRATORY:  Clear to auscultation without rales, wheezing or rhonchi  ABDOMEN: Soft, non-tender, non-distended EXTREMITIES:  No edema; No deformity    EKG today demonstrates SB, 1st degree AV block Vent. rate 53 BPM PR interval 242 ms QRS duration 84 ms QT/QTcB 482/452  ms   CHA2DS2-VASc Score = 4  The patient's score is based upon: CHF History: 0 HTN History: 1 Diabetes History: 0 Stroke History: 0 Vascular Disease History: 1 Age Score: 2 Gender Score: 0       ASSESSMENT AND PLAN: Paroxysmal Atrial Fibrillation/atrial flutter (ICD10:  I48.0) The patient's CHA2DS2-VASc score is 4, indicating a 4.8% annual risk of stroke.   S/p remote afib ablation by Dr Epifanio, repeat ablations by Dr Kelsie 07/2019 and 02/2020. Flecainide  discontinued due to CAD. Multaq  discontinued due to junctional bradycardia. S/p dofetilide  loading 08/2023 Patient appears to be maintaining SR Continue Eliquis  5 mg BID Continue dofetilide   500 mcg BID  Secondary Hypercoagulable State (ICD10:  D68.69) The patient is at significant risk for stroke/thromboembolism based upon his CHA2DS2-VASc Score of 4.  Continue Apixaban  (Eliquis ). No bleeding issues.   High Risk Medication Monitoring (ICD 10: U5195107) Patient requires ongoing monitoring for anti-arrhythmic medication which has the potential to cause life threatening arrhythmias. QT interval on ECG acceptable for dofetilide  monitoring. Check bmet/mag today.     HTN Stable on current regimen  CAD CAC score 2280 No anginal symptoms Followed by Dr Lonni     Follow up in the AF clinic in 3 months.    Daril Kicks PA-C Afib Clinic [facility] [address] [place], [place] [address] [phone]

## 2023-10-06 ENCOUNTER — Ambulatory Visit (HOSPITAL_COMMUNITY): Payer: Self-pay | Admitting: Physician Assistant

## 2023-10-06 LAB — BASIC METABOLIC PANEL WITH GFR
BUN/Creatinine Ratio: 11 (ref 10–24)
BUN: 13 mg/dL (ref 8–27)
CO2: 24 mmol/L (ref 20–29)
Calcium: 9.7 mg/dL (ref 8.6–10.2)
Chloride: 101 mmol/L (ref 96–106)
Creatinine, Ser: 1.17 mg/dL (ref 0.76–1.27)
Glucose: 80 mg/dL (ref 70–99)
Potassium: 4.7 mmol/L (ref 3.5–5.2)
Sodium: 142 mmol/L (ref 134–144)
eGFR: 64 mL/min/1.73 (ref >59)

## 2023-10-06 LAB — MAGNESIUM: Magnesium: 2 mg/dL (ref 1.6–2.3)

## 2023-10-13 DIAGNOSIS — Z9359 Other cystostomy status: Secondary | ICD-10-CM | POA: Diagnosis not present

## 2023-10-13 DIAGNOSIS — Z466 Encounter for fitting and adjustment of urinary device: Secondary | ICD-10-CM | POA: Diagnosis not present

## 2023-10-24 NOTE — Progress Notes (Unsigned)
 No chief complaint on file.  ASSESSMENT AND PLAN  Eugene Castaneda is a 78 y.o. male   1.  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 Has suprapubic catheter, current UTI. -Call for any worsening symptoms, weakness, follow-up in 1 year or sooner with Dr. Onita  11/24/22 SS: Labs from PCP 11/14/22 WBC 6.5, Hgb 15.4, platelet 222, glucose 128, creatinine 1.38, potassium 6.2, calcium  10.4, AST 21, ALT 23, TSH 25.900, A1c 7.0,, urine positive for nitrites, many bacteria.  Repeat 11/21/2022 potassium 5.9, creatinine 1.38, TSH 16.200.  Discontinue lisinopril and start amlodipine   No orders of the defined types were placed in this encounter.  DIAGNOSTIC DATA (LABS, IMAGING, TESTING) - I reviewed patient records, labs, notes, testing and imaging myself where available.  MEDICAL HISTORY:  Eugene Castaneda is here to follow-up for serum positive generalized myasthenia gravis. 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 Janey in May 2016 for evaluation of double vision, he also has intermittent left ptosis, at its worst, he also has mild bulbar, neck flexion weakness, mild chewing difficulty, mild proximal upper extremity weakness. He never had significant swallowing, breathing difficulty, no gait difficulty   CT chest showed no evidence of thymoma May 2016 MRI of the brain in May 2016 showed mild atrophy His symptoms responded very well to Mestinon   60 mg 3 times a day, no significant side effect Prednisone  was started since May 2016, up to 40 mg daily, he responded well, was able to tolerate tapering dose of prednisone   Imuran  was started in May 21st 2016, 50 mg 2 tablets twice a day   He has urinary incontinence, planning on to have urethral reconstruction surgery by [facility] urologist Dr.Terlecki   He had urethroplasty in 10/2013. 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 Blanca, receiving Cardiac monitoring reported bradycardia episode, 25/m during sleep   UPDATE April 07 2016:  I reviewed primary care note from Dr.[name], he was found to have mild abnormal liver functional tests, highly suspected for recent initiation of atrovastatin, also complicated by Imuran  use, wine intake, will have repeat laboratory evaluation on September twelfth 2017, if still elevated, he will hold atrovastatin for 4 weeks, and recheck, A1c was 5.6,    Repeat laboratory evaluation in January 2018 showed AST 62, ALT 49, LDL 76, cholesterol 139, he is on lower dose of atorvastatin now,   He is now taking Imuran  50 mg 2  tablets twice a day, at extreme gaze he has mild double vision, no problem reading, he has several episodes he has mild swallowing trouble, he feels like that the steak stuck behind his sternum, he has mild hoarseness,     He was treated with Tamiflu in Jan 2018 as preventive  medication.   He swim one hour without difficulty.     UPDATE Sept 10 2018: He was seen by Dr. Brigida on July 01 2016, lab evaluations showed CMP, glucose 110, creatinine 0.8, mild elevated AST 59, ALT 44, WBC was 3.79, hemoglobin of 14.2, normal thyroid  functional tasks, LDL was 70, A1c 5.8.   He is taking Imuran  50 mg twice a day, doing very well, no significant side effect noticed, continue has mild double vision on extreme gaze, able to swim regularly   UPDATE Sept 11 2019: He is overall doing very well, there is no recurrent muscle weakness, when he drives, looking to extreme left or right, he had transient double vision, he denies swallowing difficulty, no limb muscle weakness, able to swim 1 hour without stopping.   Laboratory evaluations in May 2019, A1c was 5.7, normal CBC, creatinine of 0.9, hemoglobin of 14.5, normal TSH, 0.93, elevated PSA 6.98  Update October 22, 2020: He is overall doing very well, no recurrent double vision, droopy eyelid, or limb muscle weakness, taking Imuran  50 mg 2 tablets twice a day, does not take Mestinon , able to swim freestyle 1 hour without difficulty,  Update October 28, 2021 SS: Able to reduce the Imuran  50/100 mg daily. No change, doing well. Denies diplopia, blurry vision. No weakness of arms or legs. Swims 3 days a week for 1 hour freestyle, 3 days at fitness. No health issues. 10/06/21 A1C 5.8, CMP glucose 119, creatinine 1.2, normal ALT AST, WBC 4.65, HGB 14.6,   Update October 25, 2022 SS: Since last year has been able to reduce imuran  down to 50 mg twice daily. Denies any diplopia, ptosis, trouble swallowing. Sees Dr. Janey next month, continues swimming 3 miles a week, fitness center walking.  Suprapubic cath with leg bag, at least 7 years. Currently has UTI. Labs 04/14/22 normal LFTs, creatinine 1.29.  Update October 25, 2023 SS:  PHYSICAL EXAM:   There were no vitals filed for this visit.  Not recorded    There is no height or weight  on file to calculate BMI.  PHYSICAL EXAMNIATION:  Gen: NAD, conversant, well nourised, well groomed        NEUROLOGICAL EXAM:  MENTAL STATUS: Speech/cognition: Awake, alert, oriented to history taking and casual conversation   CRANIAL NERVES: CN II: Visual fields are full to confrontation. Pupils are round equal and briskly reactive to light. CN III, IV, VI: extraocular movement are normal. No ptosis.  No eye open weakness. Mild cheek puff weakness.  CN V: Facial sensation is intact to light touch CN VII: Face is symmetric with normal eye closure  CN VIII: Hearing is normal to causal conversation. CN IX, X: Phonation is normal. CN XI: Head turning and shoulder shrug are intact  MOTOR: Normal muscle tone, bulk and strength  REFLEXES: Reflexes are 1 and symmetric at the biceps, triceps, knees, and ankles.    SENSORY: Intact to light touch  COORDINATION: Finger-nose-finger and heel-to-shin are normal bilaterally.  GAIT/STANCE: Normal.  Able to stand from seated position without pushoff.  REVIEW OF SYSTEMS:  Full 14 system review of systems performed and notable only for as above  See HPI  ALLERGIES: No  Known Allergies  HOME MEDICATIONS: Current Outpatient Medications  Medication Sig Dispense Refill   amLODipine  (NORVASC ) 10 MG tablet Take 10 mg by mouth in the morning.     apixaban  (ELIQUIS ) 5 MG TABS tablet Take 1 tablet by mouth twice daily 180 tablet 1   azaTHIOprine  (IMURAN ) 50 MG tablet Take 0.5 tablets (25 mg total) by mouth in the morning AND 1 tablet (50 mg total) every evening. 180 tablet 3   cholecalciferol  (VITAMIN D3) 25 MCG (1000 UNIT) tablet Take 1,000 Units by mouth every evening. 1 Tablet Daily     clotrimazole-betamethasone (LOTRISONE) cream Apply 1 application  topically 2 (two) times daily as needed (irritation).     CRANBERRY PO Take 1 tablet by mouth every evening. 650 mg daily     CYANOCOBALAMIN  PO Take 1 Dose by mouth in the morning. Liquid      DENTA 5000 PLUS 1.1 % CREA dental cream Take 1 Application by mouth at bedtime.     dofetilide  (TIKOSYN ) 500 MCG capsule Take 1 capsule (500 mcg total) by mouth 2 (two) times daily. 60 capsule 6   empagliflozin (JARDIANCE) 25 MG TABS tablet Take 25 mg by mouth in the morning.     ferrous sulfate  325 (65 FE) MG EC tablet Take 325 mg by mouth 2 (two) times daily.     Fish Oil OIL Take 1,000 mg by mouth in the morning.     gluconic acid-citric acid (RENACIDIN ) irrigation Irrigate with 30 mLs as directed as needed.     hydrALAZINE  (APRESOLINE ) 50 MG tablet Take 1 tablet (50 mg total) by mouth 2 (two) times daily. 180 tablet 1   Incontinence Supplies (CATHETER EXTENSION TUBING) MISC Inject 1 fluid ounce into the vein as needed. Use as directed     Leuprolide  Acetate (LUPRON  IJ) Inject as directed as needed (Prostate cancer).      levothyroxine  (SYNTHROID ) 200 MCG tablet Take 200 mcg by mouth daily before breakfast. Take 1 tablet by mouth with a 25mcg Levothyroxine  for a combined dose of 225mcg.     levothyroxine  (SYNTHROID ) 25 MCG tablet Take 25 mcg by mouth every morning. Take 1 tablet by mouth with a 200mcg Levothyroxine  for a combined dose of 225mcg.     methenamine (HIPREX) 1 g tablet Take 1 g by mouth 2 (two) times daily.     mirabegron  ER (MYRBETRIQ ) 50 MG TB24 tablet Take 50 mg by mouth in the morning.     Multiple Vitamin (MULTIVITAMIN) tablet Take 1 tablet by mouth every evening.     omeprazole (PRILOSEC) 20 MG capsule Take 20 mg by mouth in the morning.     rosuvastatin  (CRESTOR ) 10 MG tablet Take 10 mg by mouth in the morning.     triamcinolone  cream (KENALOG ) 0.1 % Apply 1 Application topically daily as needed.     vitamin C  (ASCORBIC ACID ) 500 MG tablet Take 500 mg by mouth every evening.     Water  For Irrigation, Sterile (STERILE WATER  FOR IRRIGATION) Irrigate with 1,000,000 mLs as directed as needed. Use as directed     Water  For Irrigation, Sterile (STERILE WATER ) Use to flush catherter  as directed 500 mL 11   No current facility-administered medications for this visit.    PAST MEDICAL HISTORY: Past Medical History:  Diagnosis Date   Anemia    on meds   CAD (coronary artery disease), native coronary artery 11/13/2017   Coronary calcification noted on CT scan    Chronic kidney disease (CKD),  stage III (moderate) (HCC) 11/13/2017   has suprapubic catheter   Diplopia    Foley catheter present    pt has supra-pubic foley catheter in place/since 2016   GERD (gastroesophageal reflux disease)    on meds   Hyperlipemia    on meds   Hypertension    on meds   Hypertensive heart disease without CHF 11/13/2017   Hypothyroidism    on meds   Impaired fasting glucose    MG (myasthenia gravis) (HCC)    Paroxysmal atrial fibrillation (HCC) 11/13/2017   RF ablation Dr. Epifanio 2010 [facility] 3   Prostate cancer [facility]) 2009   Urethral obstruction    Urinary retention    Suprapubic Catheter placed 04/25/15    PAST SURGICAL HISTORY: Past Surgical History:  Procedure Laterality Date   Artificial Urinary Sphincter  09/2014   has supra-pubic foley catheter put in 2016/sphincter removed in 2017 replaced with the catheter   ATRIAL FIBRILLATION ABLATION N/A 07/23/2019   Procedure: ATRIAL FIBRILLATION ABLATION;  Surgeon: Kelsie Agent, MD;  Location: [facility] INVASIVE CV LAB;  Service: Cardiovascular;  Laterality: N/A;   ATRIAL FIBRILLATION ABLATION N/A 02/06/2020   Procedure: ATRIAL FIBRILLATION ABLATION;  Surgeon: Kelsie Agent, MD;  Location: [facility] INVASIVE CV LAB;  Service: Cardiovascular;  Laterality: N/A;   catheter ablationfor afib  2010   COLONOSCOPY  2022   PROSTATECTOMY  2009   TONSILLECTOMY      FAMILY HISTORY: Family History  Problem Relation Age of Onset   Breast cancer Mother    Parkinson's disease Father    Colon cancer Neg Hx    Esophageal cancer Neg Hx    Rectal cancer Neg Hx    Colon polyps Neg Hx    Stomach cancer Neg Hx     SOCIAL  HISTORY: Social History   Socioeconomic History   Marital status: Married    Spouse name: Not on file   Number of children: 2   Years of education: Masters   Highest education level: Not on file  Occupational History   Occupation: Retired  Tobacco Use   Smoking status: Former    Current packs/day: 0.00    Types: Cigarettes    Quit date: 02/01/1980    Years since quitting: 43.7   Smokeless tobacco: Never   Tobacco comments:    Former smoker 04/14/22  Vaping Use   Vaping status: Never Used  Substance and Sexual Activity   Alcohol  use: Yes    Alcohol /week: 0.0 - 4.0 standard drinks of alcohol     Comment: 1 glass red wine daily 04/14/22   Drug use: No   Sexual activity: Not on file  Other Topics Concern   Not on file  Social History Narrative   Lives in [place] with spouse.   Right-handed.   2 cups caffeine daily.   Retired from Associate Professor   Social Drivers of Corporate investment banker Strain: Not on file  Food Insecurity: No Food Insecurity (08/21/2023)   Hunger Vital Sign    Worried About Running Out of Food in the Last Year: Never true    Ran Out of Food in the Last Year: Never true  Transportation Needs: No Transportation Needs (08/21/2023)   PRAPARE - Administrator, Civil Service (Medical): No    Lack of Transportation (Non-Medical): No  Physical Activity: Not on file  Stress: Not on file  Social Connections: Moderately Isolated (08/21/2023)   Social Connection and Isolation Panel  Frequency of Communication with Friends and Family: Three times a week    Frequency of Social Gatherings with Friends and Family: Three times a week    Attends Religious Services: Never    Active Member of Clubs or Organizations: No    Attends Banker Meetings: Never    Marital Status: Married  Catering manager Violence: Not At Risk (08/21/2023)   Humiliation, Afraid, Rape, and Kick questionnaire    Fear of Current or Ex-Partner:  No    Emotionally Abused: No    Physically Abused: No    Sexually Abused: No   Lauraine Born, SCHARLENE, DNP  [facility] [address], [address] [place], [place] [address] [phone]

## 2023-10-25 ENCOUNTER — Encounter: Payer: Self-pay | Admitting: Neurology

## 2023-10-25 ENCOUNTER — Ambulatory Visit (INDEPENDENT_AMBULATORY_CARE_PROVIDER_SITE_OTHER): Payer: Medicare Other | Admitting: Neurology

## 2023-10-25 VITALS — BP 130/70 | Ht 70.0 in | Wt 189.0 lb

## 2023-10-25 DIAGNOSIS — G7000 Myasthenia gravis without (acute) exacerbation: Secondary | ICD-10-CM

## 2023-10-25 MED ORDER — AZATHIOPRINE 50 MG PO TABS: 25.0000 mg | ORAL_TABLET | Freq: Two times a day (BID) | ORAL | 3 refills | Status: AC

## 2023-10-25 NOTE — Patient Instructions (Signed)
 Lower dose of Imuran  to 25 mg twice daily Monitor for any worsening symptoms, presenting as weakness Follow up in 6 months with Dr. Onita

## 2023-10-28 NOTE — Progress Notes (Signed)
 Chart reviewed, agree above plan ?

## 2023-11-02 DIAGNOSIS — C61 Malignant neoplasm of prostate: Secondary | ICD-10-CM | POA: Diagnosis not present

## 2023-11-04 DIAGNOSIS — Z23 Encounter for immunization: Secondary | ICD-10-CM | POA: Diagnosis not present

## 2023-11-08 DIAGNOSIS — R339 Retention of urine, unspecified: Secondary | ICD-10-CM | POA: Diagnosis not present

## 2023-11-08 DIAGNOSIS — Z466 Encounter for fitting and adjustment of urinary device: Secondary | ICD-10-CM | POA: Diagnosis not present

## 2023-11-21 DIAGNOSIS — C61 Malignant neoplasm of prostate: Secondary | ICD-10-CM | POA: Diagnosis not present

## 2023-11-28 DIAGNOSIS — Z125 Encounter for screening for malignant neoplasm of prostate: Secondary | ICD-10-CM | POA: Diagnosis not present

## 2023-11-28 DIAGNOSIS — I129 Hypertensive chronic kidney disease with stage 1 through stage 4 chronic kidney disease, or unspecified chronic kidney disease: Secondary | ICD-10-CM | POA: Diagnosis not present

## 2023-11-28 DIAGNOSIS — E785 Hyperlipidemia, unspecified: Secondary | ICD-10-CM | POA: Diagnosis not present

## 2023-11-28 DIAGNOSIS — Z1212 Encounter for screening for malignant neoplasm of rectum: Secondary | ICD-10-CM | POA: Diagnosis not present

## 2023-11-28 DIAGNOSIS — R7301 Impaired fasting glucose: Secondary | ICD-10-CM | POA: Diagnosis not present

## 2023-11-28 DIAGNOSIS — N1831 Chronic kidney disease, stage 3a: Secondary | ICD-10-CM | POA: Diagnosis not present

## 2023-11-28 DIAGNOSIS — E039 Hypothyroidism, unspecified: Secondary | ICD-10-CM | POA: Diagnosis not present

## 2023-11-28 DIAGNOSIS — D649 Anemia, unspecified: Secondary | ICD-10-CM | POA: Diagnosis not present

## 2023-12-05 DIAGNOSIS — E1122 Type 2 diabetes mellitus with diabetic chronic kidney disease: Secondary | ICD-10-CM | POA: Diagnosis not present

## 2023-12-05 DIAGNOSIS — Z23 Encounter for immunization: Secondary | ICD-10-CM | POA: Diagnosis not present

## 2023-12-05 DIAGNOSIS — C61 Malignant neoplasm of prostate: Secondary | ICD-10-CM | POA: Diagnosis not present

## 2023-12-05 DIAGNOSIS — I2510 Atherosclerotic heart disease of native coronary artery without angina pectoris: Secondary | ICD-10-CM | POA: Diagnosis not present

## 2023-12-05 DIAGNOSIS — E785 Hyperlipidemia, unspecified: Secondary | ICD-10-CM | POA: Diagnosis not present

## 2023-12-05 DIAGNOSIS — Z1339 Encounter for screening examination for other mental health and behavioral disorders: Secondary | ICD-10-CM | POA: Diagnosis not present

## 2023-12-05 DIAGNOSIS — H9190 Unspecified hearing loss, unspecified ear: Secondary | ICD-10-CM | POA: Diagnosis not present

## 2023-12-05 DIAGNOSIS — R82998 Other abnormal findings in urine: Secondary | ICD-10-CM | POA: Diagnosis not present

## 2023-12-05 DIAGNOSIS — I10 Essential (primary) hypertension: Secondary | ICD-10-CM | POA: Diagnosis not present

## 2023-12-05 DIAGNOSIS — Z0000 Encounter for general adult medical examination without abnormal findings: Secondary | ICD-10-CM | POA: Diagnosis not present

## 2023-12-05 DIAGNOSIS — I480 Paroxysmal atrial fibrillation: Secondary | ICD-10-CM | POA: Diagnosis not present

## 2023-12-05 DIAGNOSIS — E039 Hypothyroidism, unspecified: Secondary | ICD-10-CM | POA: Diagnosis not present

## 2023-12-05 DIAGNOSIS — Z9359 Other cystostomy status: Secondary | ICD-10-CM | POA: Diagnosis not present

## 2023-12-05 DIAGNOSIS — I129 Hypertensive chronic kidney disease with stage 1 through stage 4 chronic kidney disease, or unspecified chronic kidney disease: Secondary | ICD-10-CM | POA: Diagnosis not present

## 2023-12-05 DIAGNOSIS — Z1331 Encounter for screening for depression: Secondary | ICD-10-CM | POA: Diagnosis not present

## 2023-12-05 DIAGNOSIS — N1831 Chronic kidney disease, stage 3a: Secondary | ICD-10-CM | POA: Diagnosis not present

## 2023-12-06 DIAGNOSIS — C61 Malignant neoplasm of prostate: Secondary | ICD-10-CM | POA: Diagnosis not present

## 2023-12-06 DIAGNOSIS — Z466 Encounter for fitting and adjustment of urinary device: Secondary | ICD-10-CM | POA: Diagnosis not present

## 2023-12-06 DIAGNOSIS — R339 Retention of urine, unspecified: Secondary | ICD-10-CM | POA: Diagnosis not present

## 2023-12-06 DIAGNOSIS — Z9359 Other cystostomy status: Secondary | ICD-10-CM | POA: Diagnosis not present

## 2023-12-20 ENCOUNTER — Other Ambulatory Visit (HOSPITAL_BASED_OUTPATIENT_CLINIC_OR_DEPARTMENT_OTHER): Payer: Self-pay

## 2023-12-20 DIAGNOSIS — Z23 Encounter for immunization: Secondary | ICD-10-CM | POA: Diagnosis not present

## 2023-12-20 MED ORDER — COMIRNATY 30 MCG/0.3ML IM SUSY
0.3000 mL | PREFILLED_SYRINGE | Freq: Once | INTRAMUSCULAR | 0 refills | Status: AC
  Filled 2023-12-20: qty 0.3, 1d supply, fill #0

## 2024-01-03 DIAGNOSIS — Z9359 Other cystostomy status: Secondary | ICD-10-CM | POA: Diagnosis not present

## 2024-01-03 DIAGNOSIS — Z466 Encounter for fitting and adjustment of urinary device: Secondary | ICD-10-CM | POA: Diagnosis not present

## 2024-01-08 ENCOUNTER — Ambulatory Visit (HOSPITAL_COMMUNITY): Admitting: Physician Assistant

## 2024-01-09 ENCOUNTER — Other Ambulatory Visit (HOSPITAL_BASED_OUTPATIENT_CLINIC_OR_DEPARTMENT_OTHER): Payer: Self-pay | Admitting: Family

## 2024-01-09 DIAGNOSIS — I10 Essential (primary) hypertension: Secondary | ICD-10-CM

## 2024-01-11 ENCOUNTER — Ambulatory Visit (HOSPITAL_COMMUNITY)
Admission: RE | Admit: 2024-01-11 | Discharge: 2024-01-11 | Attending: Physician Assistant | Admitting: Physician Assistant

## 2024-01-11 VITALS — BP 134/60 | HR 48 | Ht 70.0 in | Wt 185.4 lb

## 2024-01-11 DIAGNOSIS — Z5181 Encounter for therapeutic drug level monitoring: Secondary | ICD-10-CM | POA: Insufficient documentation

## 2024-01-11 DIAGNOSIS — Z79899 Other long term (current) drug therapy: Secondary | ICD-10-CM | POA: Diagnosis not present

## 2024-01-11 DIAGNOSIS — I4891 Unspecified atrial fibrillation: Secondary | ICD-10-CM | POA: Insufficient documentation

## 2024-01-11 DIAGNOSIS — D6869 Other thrombophilia: Secondary | ICD-10-CM | POA: Diagnosis not present

## 2024-01-11 DIAGNOSIS — I480 Paroxysmal atrial fibrillation: Secondary | ICD-10-CM | POA: Diagnosis not present

## 2024-01-11 NOTE — Progress Notes (Signed)
 Primary Care Physician: [name], [name], MD Referring Physician: Dr. Kelsie  Primary Cardiologist: Dr Lonni  Primary EP: Dr Nancey   Eugene Castaneda is a 78 y.o. male with a h/o paroxysmal afib, CAD, CKD, HLD, HTN, myasthenia gravis, hypothyroidism who presents for follow up in the [facility]. Initial afib ablation several years ago with Dr. Epifanio in [place]. He had been maintained on flecainide  without any daily AV nodal agents but this was discontinued due to CAD diagnosis. Transitioned to Multaq  which was discontinued 07/07/23 due to bradycardia/junctional rhythm.    Since stopping Multaq , he has been having much more frequent palpitations. Review of his Kardia mobile strips showed SR with PACs and also true paroxysms of afib. Patient is s/p dofetilide  loading 08/2023.  Patient returns for follow up for atrial fibrillation and dofetilide  monitoring. He denies any interim symptoms of afib. No bleeding issues on anticoagulation.   Today, he  denies symptoms of palpitations, chest pain, shortness of breath, orthopnea, PND, lower extremity edema, dizziness, presyncope, syncope, snoring, daytime somnolence, bleeding, or neurologic sequela. The patient is tolerating medications without difficulties and is otherwise without complaint today.    Past Medical History:  Diagnosis Date   Anemia    on meds   CAD (coronary artery disease), native coronary artery 11/13/2017   Coronary calcification noted on CT scan    Chronic kidney disease (CKD), stage III (moderate) (HCC) 11/13/2017   has suprapubic catheter   Diplopia    Foley catheter present    pt has supra-pubic foley catheter in place/since 2016   GERD (gastroesophageal reflux disease)    on meds   Hyperlipemia    on meds   Hypertension    on meds   Hypertensive heart disease without CHF 11/13/2017   Hypothyroidism    on meds   Impaired fasting glucose    MG (myasthenia gravis) (HCC)    Paroxysmal  atrial fibrillation (HCC) 11/13/2017   RF ablation Dr. Epifanio 2010 [facility] 3   Prostate cancer [facility]) 2009   Urethral obstruction    Urinary retention    Suprapubic Catheter placed 04/25/15    Current Outpatient Medications  Medication Sig Dispense Refill   amLODipine  (NORVASC ) 10 MG tablet Take 10 mg by mouth in the morning.     apixaban  (ELIQUIS ) 5 MG TABS tablet Take 1 tablet by mouth twice daily 180 tablet 1   azaTHIOprine  (IMURAN ) 50 MG tablet Take 0.5 tablets (25 mg total) by mouth in the morning and at bedtime. 90 tablet 3   cholecalciferol  (VITAMIN D3) 25 MCG (1000 UNIT) tablet Take 1,000 Units by mouth every evening. 1 Tablet Daily     clotrimazole-betamethasone (LOTRISONE) cream Apply 1 application  topically 2 (two) times daily as needed (irritation).     CRANBERRY PO Take 1 tablet by mouth every evening. 650 mg daily     CYANOCOBALAMIN  PO Take 1 Dose by mouth in the morning. Liquid     DENTA 5000 PLUS 1.1 % CREA dental cream Take 1 Application by mouth at bedtime.     dofetilide  (TIKOSYN ) 500 MCG capsule Take 1 capsule (500 mcg total) by mouth 2 (two) times daily. 60 capsule 6   empagliflozin (JARDIANCE) 25 MG TABS tablet Take 25 mg by mouth in the morning.     ferrous sulfate  325 (65 FE) MG EC tablet Take 325 mg by mouth 2 (two) times daily.     Fish Oil OIL Take 1,000 mg by mouth  in the morning.     gluconic acid-citric acid (RENACIDIN ) irrigation Irrigate with 30 mLs as directed as needed.     hydrALAZINE  (APRESOLINE ) 50 MG tablet Take 1 tablet (50 mg) by mouth twice daily. Please call the office at [phone] to schedule an appointment with the general cardiology team for future refills. 180 tablet 0   Incontinence Supplies (CATHETER EXTENSION TUBING) MISC Inject 1 fluid ounce into the vein as needed. Use as directed     Leuprolide  Acetate (LUPRON  IJ) Inject as directed as needed (Prostate cancer).      levothyroxine  (SYNTHROID ) 200 MCG tablet Take 200  mcg by mouth daily before breakfast. Take 1 tablet by mouth with a 25mcg Levothyroxine  for a combined dose of 225mcg.     levothyroxine  (SYNTHROID ) 25 MCG tablet Take 25 mcg by mouth every morning. Take 1 tablet by mouth with a 200mcg Levothyroxine  for a combined dose of 225mcg.     mirabegron  ER (MYRBETRIQ ) 50 MG TB24 tablet Take 50 mg by mouth in the morning.     Multiple Vitamin (MULTIVITAMIN) tablet Take 1 tablet by mouth every evening.     omeprazole (PRILOSEC) 20 MG capsule Take 20 mg by mouth in the morning.     rosuvastatin  (CRESTOR ) 10 MG tablet Take 10 mg by mouth in the morning.     triamcinolone  cream (KENALOG ) 0.1 % Apply 1 Application topically daily as needed.     vitamin C  (ASCORBIC ACID ) 500 MG tablet Take 500 mg by mouth every evening.     Water  For Irrigation, Sterile (STERILE WATER  FOR IRRIGATION) Irrigate with 1,000,000 mLs as directed as needed. Use as directed     Water  For Irrigation, Sterile (STERILE WATER ) Use to flush catherter as directed 500 mL 11   No current facility-administered medications for this encounter.    ROS- All systems are reviewed and negative except as per the HPI above  Physical Exam: Vitals:   01/11/24 1323  BP: 134/60  Pulse: (!) 48  Weight: 84.1 kg  Height: 5' 10 (1.778 m)     Wt Readings from Last 3 Encounters:  01/11/24 84.1 kg  10/25/23 85.7 kg  10/05/23 86.4 kg    GEN: Well nourished, well developed in no acute distress CARDIAC: Regular rate and rhythm, no murmurs, rubs, gallops RESPIRATORY:  Clear to auscultation without rales, wheezing or rhonchi  ABDOMEN: Soft, non-tender, non-distended EXTREMITIES:  No edema; No deformity     EKG Interpretation Date/Time:  Thursday January 11 2024 13:38:39 EST Ventricular Rate:  48 PR Interval:  246 QRS Duration:  84 QT Interval:  482 QTC Calculation: 430 R Axis:   -42  Text Interpretation: Junctional rhythm converting to sinus rhythm Left axis deviation Nonspecific ST  abnormality Abnormal ECG When compared with ECG of 05-Oct-2023 14:00, Junctional rhythm has replaced Sinus rhythm Confirmed by Yazeed Pryer ([id number]) on 01/11/2024 1:55:25 PM    CHA2DS2-VASc Score = 4  The patient's score is based upon: CHF History: 0 HTN History: 1 Diabetes History: 0 Stroke History: 0 Vascular Disease History: 1 Age Score: 2 Gender Score: 0       ASSESSMENT AND PLAN: Paroxysmal Atrial Fibrillation/atrial flutter (ICD10:  I48.0) The patient's CHA2DS2-VASc score is 4, indicating a 4.8% annual risk of stroke.   S/p remote afib ablation by Dr Epifanio, repeat ablations by Dr Kelsie 07/2019 and 02/2020. Flecainide  discontinued due to CAD. Multaq  discontinued due to junctional bradycardia.  S/p dofetilide  loading 08/2023 Patient appears to be maintaining SR  Continue dofetilide  500 mcg BID Continue Eliquis  5 mg BID  Secondary Hypercoagulable State (ICD10:  D68.69) The patient is at significant risk for stroke/thromboembolism based upon his CHA2DS2-VASc Score of 4.  Continue Apixaban  (Eliquis ). No bleeding issues.   High Risk Medication Monitoring (ICD 10: J342684) Patient requires ongoing monitoring for anti-arrhythmic medication which has the potential to cause life threatening arrhythmias. QT interval on ECG acceptable for dofetilide  monitoring. Check bmet/mag today.     HTN Stable on current regimen  CAD CAC score 2280 No anginal symptoms Followed by Dr Lonni   Follow up in the AF clinic in 3 months.    Daril Kicks PA-C Afib [facility] [address] [place], [place] [address] [phone]

## 2024-01-12 ENCOUNTER — Ambulatory Visit (HOSPITAL_COMMUNITY): Payer: Self-pay | Admitting: Physician Assistant

## 2024-01-12 LAB — BASIC METABOLIC PANEL WITH GFR
BUN/Creatinine Ratio: 13 (ref 10–24)
BUN: 15 mg/dL (ref 8–27)
CO2: 24 mmol/L (ref 20–29)
Calcium: 9.9 mg/dL (ref 8.6–10.2)
Chloride: 102 mmol/L (ref 96–106)
Creatinine, Ser: 1.2 mg/dL (ref 0.76–1.27)
Glucose: 83 mg/dL (ref 70–99)
Potassium: 4.8 mmol/L (ref 3.5–5.2)
Sodium: 141 mmol/L (ref 134–144)
eGFR: 62 mL/min/1.73 (ref >59)

## 2024-01-12 LAB — MAGNESIUM: Magnesium: 2 mg/dL (ref 1.6–2.3)

## 2024-02-28 ENCOUNTER — Other Ambulatory Visit: Payer: Self-pay | Admitting: Cardiology

## 2024-02-28 DIAGNOSIS — I480 Paroxysmal atrial fibrillation: Secondary | ICD-10-CM

## 2024-04-11 ENCOUNTER — Ambulatory Visit (HOSPITAL_COMMUNITY): Admitting: Physician Assistant

## 2024-04-23 ENCOUNTER — Ambulatory Visit: Admitting: Neurology

## 2024-05-14 ENCOUNTER — Ambulatory Visit (HOSPITAL_BASED_OUTPATIENT_CLINIC_OR_DEPARTMENT_OTHER): Admitting: Cardiology
